# Patient Record
Sex: Female | Born: 1975 | Race: Black or African American | Hispanic: No | Marital: Single | Smoking: Never smoker
Health system: Southern US, Community
[De-identification: ages and names within clinical notes are randomized; demographics above are authoritative.]

## PROBLEM LIST (undated history)

## (undated) ENCOUNTER — Inpatient Hospital Stay (HOSPITAL_COMMUNITY): Payer: Self-pay

## (undated) DIAGNOSIS — M539 Dorsopathy, unspecified: Secondary | ICD-10-CM

## (undated) DIAGNOSIS — F32A Depression, unspecified: Secondary | ICD-10-CM

## (undated) DIAGNOSIS — T7840XA Allergy, unspecified, initial encounter: Secondary | ICD-10-CM

## (undated) DIAGNOSIS — IMO0002 Reserved for concepts with insufficient information to code with codable children: Secondary | ICD-10-CM

## (undated) DIAGNOSIS — R202 Paresthesia of skin: Secondary | ICD-10-CM

## (undated) DIAGNOSIS — G9050 Complex regional pain syndrome I, unspecified: Secondary | ICD-10-CM

## (undated) DIAGNOSIS — R55 Syncope and collapse: Secondary | ICD-10-CM

## (undated) DIAGNOSIS — F329 Major depressive disorder, single episode, unspecified: Secondary | ICD-10-CM

## (undated) DIAGNOSIS — M797 Fibromyalgia: Secondary | ICD-10-CM

## (undated) DIAGNOSIS — M1990 Unspecified osteoarthritis, unspecified site: Secondary | ICD-10-CM

## (undated) HISTORY — DX: Depression, unspecified: F32.A

## (undated) HISTORY — DX: Complex regional pain syndrome I, unspecified: G90.50

## (undated) HISTORY — DX: Paresthesia of skin: R20.2

## (undated) HISTORY — DX: Major depressive disorder, single episode, unspecified: F32.9

## (undated) HISTORY — DX: Syncope and collapse: R55

## (undated) HISTORY — DX: Fibromyalgia: M79.7

## (undated) HISTORY — DX: Allergy, unspecified, initial encounter: T78.40XA

---

## 2014-01-03 DIAGNOSIS — R21 Rash and other nonspecific skin eruption: Secondary | ICD-10-CM | POA: Diagnosis not present

## 2014-01-06 DIAGNOSIS — H1045 Other chronic allergic conjunctivitis: Secondary | ICD-10-CM | POA: Diagnosis not present

## 2014-01-06 DIAGNOSIS — L500 Allergic urticaria: Secondary | ICD-10-CM | POA: Diagnosis not present

## 2014-01-06 DIAGNOSIS — L272 Dermatitis due to ingested food: Secondary | ICD-10-CM | POA: Diagnosis not present

## 2014-02-03 DIAGNOSIS — Z0000 Encounter for general adult medical examination without abnormal findings: Secondary | ICD-10-CM | POA: Diagnosis not present

## 2014-02-03 DIAGNOSIS — G4700 Insomnia, unspecified: Secondary | ICD-10-CM | POA: Diagnosis not present

## 2014-02-03 DIAGNOSIS — M25569 Pain in unspecified knee: Secondary | ICD-10-CM | POA: Diagnosis not present

## 2014-02-03 DIAGNOSIS — M545 Low back pain, unspecified: Secondary | ICD-10-CM | POA: Diagnosis not present

## 2014-02-15 DIAGNOSIS — M25569 Pain in unspecified knee: Secondary | ICD-10-CM | POA: Diagnosis not present

## 2014-02-19 DIAGNOSIS — R209 Unspecified disturbances of skin sensation: Secondary | ICD-10-CM | POA: Diagnosis not present

## 2014-02-19 DIAGNOSIS — Z30012 Encounter for prescription of emergency contraception: Secondary | ICD-10-CM | POA: Diagnosis not present

## 2014-02-19 DIAGNOSIS — M79609 Pain in unspecified limb: Secondary | ICD-10-CM | POA: Diagnosis not present

## 2014-02-24 DIAGNOSIS — H526 Other disorders of refraction: Secondary | ICD-10-CM | POA: Diagnosis not present

## 2014-03-01 DIAGNOSIS — M79609 Pain in unspecified limb: Secondary | ICD-10-CM | POA: Diagnosis not present

## 2014-03-09 DIAGNOSIS — IMO0001 Reserved for inherently not codable concepts without codable children: Secondary | ICD-10-CM | POA: Diagnosis not present

## 2014-03-09 DIAGNOSIS — G90529 Complex regional pain syndrome I of unspecified lower limb: Secondary | ICD-10-CM | POA: Diagnosis not present

## 2014-03-09 DIAGNOSIS — Z79899 Other long term (current) drug therapy: Secondary | ICD-10-CM | POA: Diagnosis not present

## 2014-03-09 DIAGNOSIS — M533 Sacrococcygeal disorders, not elsewhere classified: Secondary | ICD-10-CM | POA: Diagnosis not present

## 2014-03-09 DIAGNOSIS — M79609 Pain in unspecified limb: Secondary | ICD-10-CM | POA: Diagnosis not present

## 2014-03-24 DIAGNOSIS — R5381 Other malaise: Secondary | ICD-10-CM | POA: Diagnosis not present

## 2014-03-24 DIAGNOSIS — Z113 Encounter for screening for infections with a predominantly sexual mode of transmission: Secondary | ICD-10-CM | POA: Diagnosis not present

## 2014-03-24 DIAGNOSIS — G90529 Complex regional pain syndrome I of unspecified lower limb: Secondary | ICD-10-CM | POA: Diagnosis not present

## 2014-03-24 DIAGNOSIS — G90519 Complex regional pain syndrome I of unspecified upper limb: Secondary | ICD-10-CM | POA: Diagnosis not present

## 2014-03-24 DIAGNOSIS — Z136 Encounter for screening for cardiovascular disorders: Secondary | ICD-10-CM | POA: Diagnosis not present

## 2014-03-24 DIAGNOSIS — R209 Unspecified disturbances of skin sensation: Secondary | ICD-10-CM | POA: Diagnosis not present

## 2014-03-24 DIAGNOSIS — R5383 Other fatigue: Secondary | ICD-10-CM | POA: Diagnosis not present

## 2014-03-24 DIAGNOSIS — M2550 Pain in unspecified joint: Secondary | ICD-10-CM | POA: Diagnosis not present

## 2014-03-24 DIAGNOSIS — Z13220 Encounter for screening for lipoid disorders: Secondary | ICD-10-CM | POA: Diagnosis not present

## 2014-04-01 DIAGNOSIS — F4542 Pain disorder with related psychological factors: Secondary | ICD-10-CM | POA: Diagnosis not present

## 2014-04-04 DIAGNOSIS — IMO0001 Reserved for inherently not codable concepts without codable children: Secondary | ICD-10-CM | POA: Diagnosis not present

## 2014-04-04 DIAGNOSIS — M25569 Pain in unspecified knee: Secondary | ICD-10-CM | POA: Diagnosis not present

## 2014-04-15 DIAGNOSIS — G894 Chronic pain syndrome: Secondary | ICD-10-CM | POA: Diagnosis not present

## 2014-04-21 DIAGNOSIS — G90529 Complex regional pain syndrome I of unspecified lower limb: Secondary | ICD-10-CM | POA: Diagnosis not present

## 2014-04-21 DIAGNOSIS — M79609 Pain in unspecified limb: Secondary | ICD-10-CM | POA: Diagnosis not present

## 2014-04-21 DIAGNOSIS — Z79899 Other long term (current) drug therapy: Secondary | ICD-10-CM | POA: Diagnosis not present

## 2014-04-21 DIAGNOSIS — M533 Sacrococcygeal disorders, not elsewhere classified: Secondary | ICD-10-CM | POA: Diagnosis not present

## 2014-04-29 DIAGNOSIS — IMO0001 Reserved for inherently not codable concepts without codable children: Secondary | ICD-10-CM | POA: Diagnosis not present

## 2014-04-29 DIAGNOSIS — T7840XA Allergy, unspecified, initial encounter: Secondary | ICD-10-CM | POA: Diagnosis not present

## 2014-05-05 DIAGNOSIS — F4542 Pain disorder with related psychological factors: Secondary | ICD-10-CM | POA: Diagnosis not present

## 2014-05-11 DIAGNOSIS — M25562 Pain in left knee: Secondary | ICD-10-CM | POA: Diagnosis not present

## 2014-05-11 DIAGNOSIS — M25561 Pain in right knee: Secondary | ICD-10-CM | POA: Diagnosis not present

## 2014-05-11 DIAGNOSIS — M797 Fibromyalgia: Secondary | ICD-10-CM | POA: Diagnosis not present

## 2014-05-11 DIAGNOSIS — M940 Chondrocostal junction syndrome [Tietze]: Secondary | ICD-10-CM | POA: Diagnosis not present

## 2014-08-08 DIAGNOSIS — R1084 Generalized abdominal pain: Secondary | ICD-10-CM | POA: Diagnosis not present

## 2015-05-18 ENCOUNTER — Encounter (HOSPITAL_COMMUNITY): Payer: Self-pay | Admitting: *Deleted

## 2015-05-18 ENCOUNTER — Emergency Department (INDEPENDENT_AMBULATORY_CARE_PROVIDER_SITE_OTHER)
Admission: EM | Admit: 2015-05-18 | Discharge: 2015-05-18 | Disposition: A | Discharge status: Home / Self Care | Payer: Medicare (Managed Care) | Source: Home / Self Care | Attending: Emergency Medicine | Admitting: Emergency Medicine

## 2015-05-18 DIAGNOSIS — G8929 Other chronic pain: Secondary | ICD-10-CM | POA: Diagnosis not present

## 2015-05-18 DIAGNOSIS — R6889 Other general symptoms and signs: Secondary | ICD-10-CM

## 2015-05-18 DIAGNOSIS — K0510 Chronic gingivitis, plaque induced: Secondary | ICD-10-CM

## 2015-05-18 DIAGNOSIS — M545 Low back pain: Secondary | ICD-10-CM | POA: Diagnosis not present

## 2015-05-18 HISTORY — DX: Dorsopathy, unspecified: M53.9

## 2015-05-18 HISTORY — DX: Unspecified osteoarthritis, unspecified site: M19.90

## 2015-05-18 MED ORDER — DICLOFENAC POTASSIUM 50 MG PO TABS: 50.0000 mg | ORAL_TABLET | Freq: Three times a day (TID) | ORAL | Status: DC

## 2015-05-18 MED ORDER — TIZANIDINE HCL 4 MG PO TABS: 4.0000 mg | ORAL_TABLET | Freq: Two times a day (BID) | ORAL | Status: DC

## 2015-05-18 MED ORDER — PENICILLIN V POTASSIUM 250 MG PO TABS: ORAL_TABLET | ORAL | Status: DC
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
## 2015-05-18 NOTE — Discharge Instructions (Signed)
Chronic Back Pain  When back pain lasts longer than 3 months, it is called chronic back pain.People with chronic back pain often go through certain periods that are more intense (flare-ups).  CAUSES Chronic back pain can be caused by wear and tear (degeneration) on different structures in your back. These structures include:  The bones of your spine (vertebrae) and the joints surrounding your spinal cord and nerve roots (facets).  The strong, fibrous tissues that connect your vertebrae (ligaments). Degeneration of these structures may result in pressure on your nerves. This can lead to constant pain. HOME CARE INSTRUCTIONS  Avoid bending, heavy lifting, prolonged sitting, and activities which make the problem worse.  Take brief periods of rest throughout the day to reduce your pain. Lying down or standing usually is better than sitting while you are resting.  Take over-the-counter or prescription medicines only as directed by your caregiver. SEEK IMMEDIATE MEDICAL CARE IF:   You have weakness or numbness in one of your legs or feet.  You have trouble controlling your bladder or bowels.  You have nausea, vomiting, abdominal pain, shortness of breath, or fainting.   This information is not intended to replace advice given to you by your health care provider. Make sure you discuss any questions you have with your health care provider.   Document Released: 08/29/2004 Document Revised: 10/14/2011 Document Reviewed: 01/09/2015 Elsevier Interactive Patient Education 2016 [place] and Dentist Visits Dental care supports good overall health. Regular dental visits can also help you avoid dental pain, bleeding, infection, and other more serious health problems in the future. It is important to keep the mouth healthy because diseases in the teeth, gums, and other oral tissues can spread to other areas of the body. Some problems, such as diabetes, heart disease, and pre-term  labor have been associated with poor oral health.  See your dentist every 6 months. If you experience emergency problems such as a toothache or broken tooth, go to the dentist right away. If you see your dentist regularly, you may catch problems early. It is easier to be treated for problems in the early stages.  WHAT TO EXPECT AT A DENTIST VISIT  Your dentist will look for many common oral health problems and recommend proper treatment. At your regular dental visit, you can expect:  Gentle cleaning of the teeth and gums. This includes scraping and polishing. This helps to remove the sticky substance around the teeth and gums (plaque). Plaque forms in the mouth shortly after eating. Over time, plaque hardens on the teeth as tartar. If tartar is not removed regularly, it can cause problems. Cleaning also helps remove stains.  Periodic X-rays. These pictures of the teeth and supporting bone will help your dentist assess the health of your teeth.  Periodic fluoride treatments. Fluoride is a natural mineral shown to help strengthen teeth. Fluoride treatmentinvolves applying a fluoride gel or varnish to the teeth. It is most commonly done in children.  Examination of the mouth, tongue, jaws, teeth, and gums to look for any oral health problems, such as:  Cavities (dental caries). This is decay on the tooth caused by plaque, sugar, and acid in the mouth. It is best to catch a cavity when it is small.  Inflammation of the gums caused by plaque buildup (gingivitis).  Problems with the mouth or malformed or misaligned teeth.  Oral cancer or other diseases of the soft tissues or jaws. KEEP YOUR TEETH AND GUMS HEALTHY For healthy teeth  and gums, follow these general guidelines as well as your dentist's specific advice:  Have your teeth professionally cleaned at the dentist every 6 months.  Brush twice daily with a fluoride toothpaste.  Floss your teeth daily.  Ask your dentist if you need  fluoride supplements, treatments, or fluoride toothpaste.  Eat a healthy diet. Reduce foods and drinks with added sugar.  Avoid smoking. TREATMENT FOR ORAL HEALTH PROBLEMS If you have oral health problems, treatment varies depending on the conditions present in your teeth and gums.  Your caregiver will most likely recommend good oral hygiene at each visit.  For cavities, gingivitis, or other oral health disease, your caregiver will perform a procedure to treat the problem. This is typically done at a separate appointment. Sometimes your caregiver will refer you to another dental specialist for specific tooth problems or for surgery. SEEK IMMEDIATE DENTAL CARE IF:  You have pain, bleeding, or soreness in the gum, tooth, jaw, or mouth area.  A permanent tooth becomes loose or separated from the gum socket.  You experience a blow or injury to the mouth or jaw area.   This information is not intended to replace advice given to you by your health care provider. Make sure you discuss any questions you have with your health care provider.   Document Released: 04/03/2011 Document Revised: 10/14/2011 Document Reviewed: 04/03/2011 Elsevier Interactive Patient Education 2016 [employer]  Gingivitis Gingivitis is a form of gum (periodontal) disease that causes redness, soreness, and swelling (inflammation) of your gums. CAUSES The most common cause of gingivitis is poor oral hygiene. A sticky substance made of bacteria, mucus, and food particles (plaque), is deposited on the exposed part of teeth. As plaque builds up, it reacts with the saliva in your mouth to form something called  tartar. Tartar is a hard deposit that becomes trapped around the base of the tooth. Plaque and tartar irritate the gums, leading to the formation of gingivitis. Other factors that increase your risk for gingivitis include:   Tobacco use.  Diabetes.  Older age.  Certain medications.  Certain viral or fungal  infections.  Dry mouth.  Hormonal changes such as during pregnancy.  Poor nutrition.  Substance abuse.  Poor fitting dental restorations or appliances. SYMPTOMS You may notice inflammation of the soft tissue (gingiva) around the teeth. When these tissues become inflamed, they bleed easily, especially during flossing or brushing. The gums may also be:   Tender to the touch.  Bright red, purple red, or have a shiny appearance.  Swollen.  Wearing away from the teeth (receding), which exposes more of the tooth. Bad breath is often present. Continued infection around teeth can eventually cause cavities and loosen teeth. This may lead to eventual tooth loss. DIAGNOSIS A medical and dental history will be taken. Your mouth, teeth, and gums will be examined. Your dentist will look for soft, swollen purple-red, irritated gums. There may be deposits of plaque and tartar at the base of the teeth. Your gums will be looked at for the degree of redness, puffiness, and bleeding tendencies. Your dentist will see if any of the teeth are loose. X-rays may be taken to see if the inflammation has spread to the supporting structures of the teeth. TREATMENT The goal is to reduce and reverse the inflammation. Proper treatment can usually reverse the symptoms of gingivitis and prevent further progression of the disease. Have your teeth cleaned. During the cleaning, all plaque and tartar will be removed. Instruction for proper home  care will be given. You will need regular professional cleanings and check-ups in the future. HOME CARE INSTRUCTIONS  Brush your teeth twice a day and floss at least once per day. When flossing, it is best to floss first then brush.  Limit sugar between meals and maintain a well-balanced diet.  Even the best dental hygiene will not prevent plaque from developing. It is necessary for you to see your dentist on a regular basis for cleaning and regular checkups.  Your dentist can  recommend proper oral hygiene and mouth care and suggest special toothpastes or mouth rinses.  Stop smoking. SEEK DENTAL OR MEDICAL CARE IF:  You have painful, reddened tissue around your teeth, or you have puffy swollen gums.  You have difficulty chewing.  You notice any loose or infected teeth.  You have swollen glands.  Your gums bleed easily when you brush your teeth or are very tender to the touch.   This information is not intended to replace advice given to you by your health care provider. Make sure you discuss any questions you have with your health care provider.   Document Released: 01/15/2001 Document Revised: 10/14/2011 Document Reviewed: 03/06/2015 Elsevier Interactive Patient Education 2016 [employer]  Musculoskeletal Pain Musculoskeletal pain is muscle and boney aches and pains. These pains can occur in any part of the body. Your caregiver may treat you without knowing the cause of the pain. They may treat you if blood or urine tests, X-rays, and other tests were normal.  CAUSES There is often not a definite cause or reason for these pains. These pains may be caused by a type of germ (virus). The discomfort may also come from overuse. Overuse includes working out too hard when your body is not fit. Boney aches also come from weather changes. Bone is sensitive to atmospheric pressure changes. HOME CARE INSTRUCTIONS   Ask when your test results will be ready. Make sure you get your test results.  Only take over-the-counter or prescription medicines for pain, discomfort, or fever as directed by your caregiver. If you were given medications for your condition, do not drive, operate machinery or power tools, or sign legal documents for 24 hours. Do not drink alcohol. Do not take sleeping pills or other medications that may interfere with treatment.  Continue all activities unless the activities cause more pain. When the pain lessens, slowly resume normal activities.  Gradually increase the intensity and duration of the activities or exercise.  During periods of severe pain, bed rest may be helpful. Lay or sit in any position that is comfortable.  Putting ice on the injured area.  Put ice in a bag.  Place a towel between your skin and the bag.  Leave the ice on for 15 to 20 minutes, 3 to 4 times a day.  Follow up with your caregiver for continued problems and no reason can be found for the pain. If the pain becomes worse or does not go away, it may be necessary to repeat tests or do additional testing. Your caregiver may need to look further for a possible cause. SEEK IMMEDIATE MEDICAL CARE IF:  You have pain that is getting worse and is not relieved by medications.  You develop chest pain that is associated with shortness or breath, sweating, feeling sick to your stomach (nauseous), or throw up (vomit).  Your pain becomes localized to the abdomen.  You develop any new symptoms that seem different or that concern you. MAKE SURE YOU:  Understand these instructions.  Will watch your condition.  Will get help right away if you are not doing well or get worse.   This information is not intended to replace advice given to you by your health care provider. Make sure you discuss any questions you have with your health care provider.   Document Released: 07/22/2005 Document Revised: 10/14/2011 Document Reviewed: 03/26/2013 Elsevier Interactive Patient Education [employer].

## 2015-06-15 ENCOUNTER — Ambulatory Visit (INDEPENDENT_AMBULATORY_CARE_PROVIDER_SITE_OTHER): Payer: Medicare (Managed Care)

## 2015-06-15 ENCOUNTER — Ambulatory Visit (INDEPENDENT_AMBULATORY_CARE_PROVIDER_SITE_OTHER): Payer: Medicare (Managed Care) | Admitting: Family Medicine

## 2015-06-15 VITALS — BP 100/70 | HR 81 | Temp 98.7°F | Resp 18 | Ht 60.0 in | Wt 164.4 lb

## 2015-06-15 DIAGNOSIS — D649 Anemia, unspecified: Secondary | ICD-10-CM

## 2015-06-15 DIAGNOSIS — H538 Other visual disturbances: Secondary | ICD-10-CM

## 2015-06-15 DIAGNOSIS — M25561 Pain in right knee: Secondary | ICD-10-CM | POA: Diagnosis not present

## 2015-06-15 DIAGNOSIS — R42 Dizziness and giddiness: Secondary | ICD-10-CM | POA: Diagnosis not present

## 2015-06-15 DIAGNOSIS — R202 Paresthesia of skin: Secondary | ICD-10-CM

## 2015-06-15 DIAGNOSIS — R55 Syncope and collapse: Secondary | ICD-10-CM

## 2015-06-15 DIAGNOSIS — G8929 Other chronic pain: Secondary | ICD-10-CM | POA: Diagnosis not present

## 2015-06-15 DIAGNOSIS — M545 Low back pain: Secondary | ICD-10-CM | POA: Diagnosis not present

## 2015-06-15 LAB — POCT CBC
Granulocyte percent: 64.1 %G (ref 37–80)
HCT, POC: 32.2 % — AB (ref 37.7–47.9)
Hemoglobin: 10 g/dL — AB (ref 12.2–16.2)
Lymph, poc: 2.1 (ref 0.6–3.4)
MCH, POC: 22.6 pg — AB (ref 27–31.2)
MCHC: 31.1 g/dL — AB (ref 31.8–35.4)
MCV: 72.7 fL — AB (ref 80–97)
MID (cbc): 0.4 (ref 0–0.9)
MPV: 8.5 fL (ref 0–99.8)
POC Granulocyte: 4.4 (ref 2–6.9)
POC LYMPH PERCENT: 30.6 %L (ref 10–50)
POC MID %: 5.3 %M (ref 0–12)
Platelet Count, POC: 259 10*3/uL (ref 142–424)
RBC: 4.44 M/uL (ref 4.04–5.48)
RDW, POC: 18.4 %
WBC: 6.8 10*3/uL (ref 4.6–10.2)

## 2015-06-15 LAB — BASIC METABOLIC PANEL
BUN: 7 mg/dL (ref 7–25)
CO2: 25 mmol/L (ref 20–31)
Calcium: 9.2 mg/dL (ref 8.6–10.2)
Chloride: 103 mmol/L (ref 98–110)
Creat: 0.62 mg/dL (ref 0.50–1.10)
Glucose, Bld: 88 mg/dL (ref 65–99)
Potassium: 4.3 mmol/L (ref 3.5–5.3)
Sodium: 137 mmol/L (ref 135–146)

## 2015-06-15 LAB — TSH: TSH: 1.523 u[IU]/mL (ref 0.350–4.500)

## 2015-06-15 LAB — GLUCOSE, POCT (MANUAL RESULT ENTRY): POC Glucose: 88 mg/dl (ref 70–99)

## 2015-06-15 MED ORDER — FERROUS SULFATE 325 (65 FE) MG PO TABS: 325.0000 mg | ORAL_TABLET | Freq: Every day | ORAL | Status: DC

## 2015-06-15 MED ORDER — LIDOCAINE 4 % EX CREA: 1.0000 "application " | TOPICAL_CREAM | Freq: Four times a day (QID) | CUTANEOUS | Status: DC | PRN

## 2015-06-15 MED ORDER — DICLOFENAC POTASSIUM 50 MG PO TABS: 50.0000 mg | ORAL_TABLET | Freq: Three times a day (TID) | ORAL | Status: DC

## 2015-06-15 NOTE — Progress Notes (Addendum)
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 fast heartbeat.  You have pain with breathing.  You have repeated fainting or seizure-like jerking during an episode.  You faint when sitting or lying down.  You have confusion.  You have trouble walking.  You have severe weakness.  You have vision problems. If you fainted, call your local emergency services (911 in [place] Do not drive yourself to the hospital.    This information is not intended to replace advice given to you by your health care provider. Make sure you discuss any questions you have with your health care provider.   Document Released: 07/22/2005 Document Revised: 12/06/2014 Document Reviewed: 09/20/2011 Elsevier Interactive Patient Education [employer].      By signing my name below, I, Zola Button, attest that this documentation has been prepared under the direction and  in the presence of Merri Ray, MD.  Electronically Signed: Zola Button, Medical Scribe. 06/15/2015. 4:12 PM.

## 2015-06-15 NOTE — Patient Instructions (Signed)
Your hemoglobin was low in the office, indicating some anemia. I will check some iron tests, but start iron supplement once per day. This was sent to your pharmacy. Recheck within the next 1 week to recheck this level as well as discuss your other symptoms today and follow-up plan.  For your back pain, I will refer you to pain management as well as orthopedics for further evaluation. Okay to use the lidocaine topically, but would also recommend taking Tylenol over the anti-inflammatory that was prescribed last month that I reprinted for you today.  I will also refer you to neurology to discuss the dizziness and recent syncopal or passing out episode. Make sure you're drinking plenty fluids throughout the day, and would be very careful taking a medicine such as the tizanidine as it can make you more sedated. Would also recommend other sedating medicines for right now.  If any further episodes of feeling like you will pass out, or worsening symptoms, call 911 or go to the emergency room.  Return to the clinic or go to the nearest emergency room if any of your symptoms worsen or new symptoms occur.  Back Pain, Adult Back pain is very common in adults.The cause of back pain is rarely dangerous and the pain often gets better over time.The cause of your back pain may not be known. Some common causes of back pain include:  Strain of the muscles or ligaments supporting the spine.  Wear and tear (degeneration) of the spinal disks.  Arthritis.  Direct injury to the back. For many people, back pain may return. Since back pain is rarely dangerous, most people can learn to manage this condition on their own. HOME CARE INSTRUCTIONS Watch your back pain for any changes. The following actions may help to lessen any discomfort you are feeling:  Remain active. It is stressful on your back to sit or stand in one place for long periods of time. Do not sit, drive, or stand in one place for more than 30 minutes  at a time. Take short walks on even surfaces as soon as you are able.Try to increase the length of time you walk each day.  Exercise regularly as directed by your health care provider. Exercise helps your back heal faster. It also helps avoid future injury by keeping your muscles strong and flexible.  Do not stay in bed.Resting more than 1-2 days can delay your recovery.  Pay attention to your body when you bend and lift. The most comfortable positions are those that put less stress on your recovering back. Always use proper lifting techniques, including:  Bending your knees.  Keeping the load close to your body.  Avoiding twisting.  Find a comfortable position to sleep. Use a firm mattress and lie on your side with your knees slightly bent. If you lie on your back, put a pillow under your knees.  Avoid feeling anxious or stressed.Stress increases muscle tension and can worsen back pain.It is important to recognize when you are anxious or stressed and learn ways to manage it, such as with exercise.  Take medicines only as directed by your health care provider. Over-the-counter medicines to reduce pain and inflammation are often the most helpful.Your health care provider may prescribe muscle relaxant drugs.These medicines help dull your pain so you can more quickly return to your normal activities and healthy exercise.  Apply ice to the injured area:  Put ice in a plastic bag.  Place a towel between your skin and the  bag.  Leave the ice on for 20 minutes, 2-3 times a day for the first 2-3 days. After that, ice and heat may be alternated to reduce pain and spasms.  Maintain a healthy weight. Excess weight puts extra stress on your back and makes it difficult to maintain good posture. SEEK MEDICAL CARE IF:  You have pain that is not relieved with rest or medicine.  You have increasing pain going down into the legs or buttocks.  You have pain that does not improve in one  week.  You have night pain.  You lose weight.  You have a fever or chills. SEEK IMMEDIATE MEDICAL CARE IF:   You develop new bowel or bladder control problems.  You have unusual weakness or numbness in your arms or legs.  You develop nausea or vomiting.  You develop abdominal pain.  You feel faint.   This information is not intended to replace advice given to you by your health care provider. Make sure you discuss any questions you have with your health care provider.   Document Released: 07/22/2005 Document Revised: 08/12/2014 Document Reviewed: 11/23/2013 Elsevier Interactive Patient Education 2016 [employer].  Anemia, Nonspecific Anemia is a condition in which the concentration of red blood cells or hemoglobin in the blood is below normal. Hemoglobin is a substance in red blood cells that carries oxygen to the tissues of the body. Anemia results in not enough oxygen reaching these tissues.  CAUSES  Common causes of anemia include:   Excessive bleeding. Bleeding may be internal or external. This includes excessive bleeding from periods (in women) or from the intestine.   Poor nutrition.   Chronic kidney, thyroid, and liver disease.  Bone marrow disorders that decrease red blood cell production.  Cancer and treatments for cancer.  HIV, AIDS, and their treatments.  Spleen problems that increase red blood cell destruction.  Blood disorders.  Excess destruction of red blood cells due to infection, medicines, and autoimmune disorders. SIGNS AND SYMPTOMS   Minor weakness.   Dizziness.   Headache.  Palpitations.   Shortness of breath, especially with exercise.   Paleness.  Cold sensitivity.  Indigestion.  Nausea.  Difficulty sleeping.  Difficulty concentrating. Symptoms may occur suddenly or they may develop slowly.  DIAGNOSIS  Additional blood tests are often needed. These help your health care provider determine the best treatment. Your  health care provider will check your stool for blood and look for other causes of blood loss.  TREATMENT  Treatment varies depending on the cause of the anemia. Treatment can include:   Supplements of iron, vitamin 123456, or folic acid.   Hormone medicines.   A blood transfusion. This may be needed if blood loss is severe.   Hospitalization. This may be needed if there is significant continual blood loss.   Dietary changes.  Spleen removal. HOME CARE INSTRUCTIONS Keep all follow-up appointments. It often takes many weeks to correct anemia, and having your health care provider check on your condition and your response to treatment is very important. SEEK IMMEDIATE MEDICAL CARE IF:   You develop extreme weakness, shortness of breath, or chest pain.   You become dizzy or have trouble concentrating.  You develop heavy vaginal bleeding.   You develop a rash.     You have bloody or black, tarry stools.   You faint.   You vomit up blood.   You vomit repeatedly.   You have abdominal pain.  You have a fever or persistent symptoms for  more than 2-3 days.   You have a fever and your symptoms suddenly get worse.   You are dehydrated.  MAKE SURE YOU:  Understand these instructions.  Will watch your condition.  Will get help right away if you are not doing well or get worse.   This information is not intended to replace advice given to you by your health care provider. Make sure you discuss any questions you have with your health care provider.   Document Released: 08/29/2004 Document Revised: 03/24/2013 Document Reviewed: 01/15/2013 Elsevier Interactive Patient Education 2016 [employer]  Dizziness Dizziness is a common problem. It is a feeling of unsteadiness or light-headedness. You may feel like you are about to faint. Dizziness can lead to injury if you stumble or fall. Anyone can become dizzy, but dizziness is more common in older adults. This  condition can be caused by a number of things, including medicines, dehydration, or illness. HOME CARE INSTRUCTIONS Taking these steps may help with your condition: Eating and Drinking  Drink enough fluid to keep your urine clear or pale yellow. This helps to keep you from becoming dehydrated. Try to drink more clear fluids, such as water.  Do not drink alcohol.  Limit your caffeine intake if directed by your health care provider.  Limit your salt intake if directed by your health care provider. Activity  Avoid making quick movements.  Rise slowly from chairs and steady yourself until you feel okay.  In the morning, first sit up on the side of the bed. When you feel okay, stand slowly while you hold onto something until you know that your balance is fine.  Move your legs often if you need to stand in one place for a long time. Tighten and relax your muscles in your legs while you are standing.  Do not drive or operate heavy machinery if you feel dizzy.  Avoid bending down if you feel dizzy. Place items in your home so that they are easy for you to reach without leaning over. Lifestyle  Do not use any tobacco products, including cigarettes, chewing tobacco, or electronic cigarettes. If you need help quitting, ask your health care provider.  Try to reduce your stress level, such as with yoga or meditation. Talk with your health care provider if you need help. General Instructions  Watch your dizziness for any changes.  Take medicines only as directed by your health care provider. Talk with your health care provider if you think that your dizziness is caused by a medicine that you are taking.  Tell a friend or a family member that you are feeling dizzy. If he or she notices any changes in your behavior, have this person call your health care provider.  Keep all follow-up visits as directed by your health care provider. This is important. SEEK MEDICAL CARE IF:  Your dizziness  does not go away.  Your dizziness or light-headedness gets worse.  You feel nauseous.  You have reduced hearing.  You have new symptoms.  You are unsteady on your feet or you feel like the room is spinning. SEEK IMMEDIATE MEDICAL CARE IF:  You vomit or have diarrhea and are unable to eat or drink anything.  You have problems talking, walking, swallowing, or using your arms, hands, or legs.  You feel generally weak.  You are not thinking clearly or you have trouble forming sentences. It may take a friend or family member to notice this.  You have chest pain, abdominal pain, shortness  of breath, or sweating.  Your vision changes.  You notice any bleeding.  You have a headache.  You have neck pain or a stiff neck.  You have a fever.   This information is not intended to replace advice given to you by your health care provider. Make sure you discuss any questions you have with your health care provider.   Document Released: 01/15/2001 Document Revised: 12/06/2014 Document Reviewed: 07/18/2014 Elsevier Interactive Patient Education 2016 [employer].   Syncope Syncope is a medical term for fainting or passing out. This means you lose consciousness and drop to the ground. People are generally unconscious for less than 5 minutes. You may have some muscle twitches for up to 15 seconds before waking up and returning to normal. Syncope occurs more often in older adults, but it can happen to anyone. While most causes of syncope are not dangerous, syncope can be a sign of a serious medical problem. It is important to seek medical care.  CAUSES  Syncope is caused by a sudden drop in blood flow to the brain. The specific cause is often not determined. Factors that can bring on syncope include:  Taking medicines that lower blood pressure.  Sudden changes in posture, such as standing up quickly.  Taking more medicine than prescribed.  Standing in one place for too long.  Seizure  disorders.  Dehydration and excessive exposure to heat.  Low blood sugar (hypoglycemia).  Straining to have a bowel movement.  Heart disease, irregular heartbeat, or other circulatory problems.  Fear, emotional distress, seeing blood, or severe pain. SYMPTOMS  Right before fainting, you may:  Feel dizzy or light-headed.  Feel nauseous.  See all white or all black in your field of vision.  Have cold, clammy skin. DIAGNOSIS  Your health care provider will ask about your symptoms, perform a physical exam, and perform an electrocardiogram (ECG) to record the electrical activity of your heart. Your health care provider may also perform other heart or blood tests to determine the cause of your syncope which may include:  Transthoracic echocardiogram (TTE). During echocardiography, sound waves are used to evaluate how blood flows through your heart.  Transesophageal echocardiogram (TEE).  Cardiac monitoring. This allows your health care provider to monitor your heart rate and rhythm in real time.  Holter monitor. This is a portable device that records your heartbeat and can help diagnose heart arrhythmias. It allows your health care provider to track your heart activity for several days, if needed.  Stress tests by exercise or by giving medicine that makes the heart beat faster. TREATMENT  In most cases, no treatment is needed. Depending on the cause of your syncope, your health care provider may recommend changing or stopping some of your medicines. HOME CARE INSTRUCTIONS  Have someone stay with you until you feel stable.  Do not drive, use machinery, or play sports until your health care provider says it is okay.  Keep all follow-up appointments as directed by your health care provider.  Lie down right away if you start feeling like you might faint. Breathe deeply and steadily. Wait until all the symptoms have passed.  Drink enough fluids to keep your urine clear or pale  yellow.  If you are taking blood pressure or heart medicine, get up slowly and take several minutes to sit and then stand. This can reduce dizziness. SEEK IMMEDIATE MEDICAL CARE IF:   You have a severe headache.  You have unusual pain in the chest, abdomen, or  back.  You are bleeding from your mouth or rectum, or you have black or tarry stool.  You have an irregular or very fast heartbeat.  You have pain with breathing.  You have repeated fainting or seizure-like jerking during an episode.  You faint when sitting or lying down.  You have confusion.  You have trouble walking.  You have severe weakness.  You have vision problems. If you fainted, call your local emergency services (911 in [place] Do not drive yourself to the hospital.    This information is not intended to replace advice given to you by your health care provider. Make sure you discuss any questions you have with your health care provider.   Document Released: 07/22/2005 Document Revised: 12/06/2014 Document Reviewed: 09/20/2011 Elsevier Interactive Patient Education [employer].

## 2015-06-16 LAB — IBC PANEL
%SAT: 7 % — ABNORMAL LOW (ref 11–50)
TIBC: 442 ug/dL (ref 250–450)
UIBC: 410 ug/dL — ABNORMAL HIGH (ref 125–400)

## 2015-06-16 LAB — IRON: IRON: 32 ug/dL — AB (ref 40–190)

## 2015-06-20 ENCOUNTER — Ambulatory Visit: Payer: Medicare (Managed Care) | Admitting: Neurology

## 2015-06-21 ENCOUNTER — Telehealth: Payer: Self-pay | Admitting: Family Medicine

## 2015-06-21 ENCOUNTER — Other Ambulatory Visit: Payer: Self-pay | Admitting: Physician Assistant

## 2015-06-21 DIAGNOSIS — D509 Iron deficiency anemia, unspecified: Secondary | ICD-10-CM

## 2015-06-21 MED ORDER — FERROUS SULFATE 300 (60 FE) MG/5ML PO SYRP
ORAL_SOLUTION | ORAL | Status: DC
Start: 2015-06-21 — End: 2017-02-28

## 2015-06-21 NOTE — Telephone Encounter (Signed)
Patient would like to know if she can be prescribed a higher dosage in her iron pill. She is currently at 325mg  however she states that her insurance will not cover that dosage amount. Can she go higher?  [phone]

## 2015-06-21 NOTE — Telephone Encounter (Signed)
I have sent an alternative to the pharmacy in the form of a liquid.  Hopefully this will be paid for.  Advise that she does not take more than what is prescribed per dose please.

## 2015-06-22 NOTE — Telephone Encounter (Signed)
Pt.notified

## 2015-07-13 ENCOUNTER — Encounter: Payer: Self-pay | Admitting: Neurology

## 2015-07-13 ENCOUNTER — Ambulatory Visit (INDEPENDENT_AMBULATORY_CARE_PROVIDER_SITE_OTHER): Payer: Medicare (Managed Care) | Admitting: Neurology

## 2015-07-13 ENCOUNTER — Encounter (INDEPENDENT_AMBULATORY_CARE_PROVIDER_SITE_OTHER): Payer: Self-pay

## 2015-07-13 VITALS — BP 112/76 | HR 87 | Ht 60.0 in | Wt 163.5 lb

## 2015-07-13 DIAGNOSIS — R202 Paresthesia of skin: Secondary | ICD-10-CM

## 2015-07-13 DIAGNOSIS — R55 Syncope and collapse: Secondary | ICD-10-CM | POA: Diagnosis not present

## 2015-07-13 HISTORY — DX: Syncope and collapse: R55

## 2015-07-13 HISTORY — DX: Paresthesia of skin: R20.2
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
## 2015-07-13 NOTE — Patient Instructions (Addendum)
We will check MRI of the brain and check an EEG study.   Syncope Syncope is a medical term for fainting or passing out. This means you lose consciousness and drop to the ground. People are generally unconscious for less than 5 minutes. You may have some muscle twitches for up to 15 seconds before waking up and returning to normal. Syncope occurs more often in older adults, but it can happen to anyone. While most causes of syncope are not dangerous, syncope can be a sign of a serious medical problem. It is important to seek medical care.  CAUSES  Syncope is caused by a sudden drop in blood flow to the brain. The specific cause is often not determined. Factors that can bring on syncope include:  Taking medicines that lower blood pressure.  Sudden changes in posture, such as standing up quickly.  Taking more medicine than prescribed.  Standing in one place for too long.  Seizure disorders.  Dehydration and excessive exposure to heat.  Low blood sugar (hypoglycemia).  Straining to have a bowel movement.  Heart disease, irregular heartbeat, or other circulatory problems.  Fear, emotional distress, seeing blood, or severe pain. SYMPTOMS  Right before fainting, you may:  Feel dizzy or light-headed.  Feel nauseous.  See all white or all black in your field of vision.  Have cold, clammy skin. DIAGNOSIS  Your health care provider will ask about your symptoms, perform a physical exam, and perform an electrocardiogram (ECG) to record the electrical activity of your heart. Your health care provider may also perform other heart or blood tests to determine the cause of your syncope which may include:  Transthoracic echocardiogram (TTE). During echocardiography, sound waves are used to evaluate how blood flows through your heart.  Transesophageal echocardiogram (TEE).  Cardiac monitoring. This allows your health care provider to monitor your heart rate and rhythm in real  time.  Holter monitor. This is a portable device that records your heartbeat and can help diagnose heart arrhythmias. It allows your health care provider to track your heart activity for several days, if needed.  Stress tests by exercise or by giving medicine that makes the heart beat faster. TREATMENT  In most cases, no treatment is needed. Depending on the cause of your syncope, your health care provider may recommend changing or stopping some of your medicines. HOME CARE INSTRUCTIONS  Have someone stay with you until you feel stable.  Do not drive, use machinery, or play sports until your health care provider says it is okay.  Keep all follow-up appointments as directed by your health care provider.  Lie down right away if you start feeling like you might faint. Breathe deeply and steadily. Wait until all the symptoms have passed.  Drink enough fluids to keep your urine clear or pale yellow.  If you are taking blood pressure or heart medicine, get up slowly and take several minutes to sit and then stand. This can reduce dizziness. SEEK IMMEDIATE MEDICAL CARE IF:   You have a severe headache.  You have unusual pain in the chest, abdomen, or back.  You are bleeding from your mouth or rectum, or you have black or tarry stool.  You have an irregular or very fast heartbeat.  You have pain with breathing.  You have repeated fainting or seizure-like jerking during an episode.  You faint when sitting or lying down.  You have confusion.  You have trouble walking.  You have severe weakness.  You have vision  problems. If you fainted, call your local emergency services (911 in [place] Do not drive yourself to the hospital.    This information is not intended to replace advice given to you by your health care provider. Make sure you discuss any questions you have with your health care provider.   Document Released: 07/22/2005 Document Revised: 12/06/2014 Document Reviewed:  09/20/2011 Elsevier Interactive Patient Education [employer].

## 2015-08-10 ENCOUNTER — Other Ambulatory Visit: Payer: Medicare (Managed Care)

## 2015-08-15 ENCOUNTER — Encounter: Payer: Self-pay | Admitting: Neurology

## 2015-08-22 ENCOUNTER — Telehealth: Payer: Self-pay | Admitting: Neurology

## 2015-08-22 NOTE — Telephone Encounter (Signed)
Patient is calling and states she received a "no show" letter but that she did call in and cancel prior.

## 2015-09-16 ENCOUNTER — Telehealth: Payer: Self-pay | Admitting: Family Medicine

## 2015-09-16 NOTE — Telephone Encounter (Signed)
Pt called to see about wait time and when I told her we were steady she asked that this message be put in and to let dr Carlota Raspberry know that she will be in first thing on Sunday morning  [phone]

## 2015-09-16 NOTE — Telephone Encounter (Signed)
Patient has been experiencing terrible leg pain for 3 weeks. She states that a referral was supposed to be placed and she has not heard anything yet. Her last visit was in November 2016. She does not want to wait to see Dr. Carlota Raspberry because she states that she cannot sit that long. She says that she will keep calling to see if the wait time has been reduced. I advised patient that if her pain is that bad she needs to go to the ED to be checked.  [phone]

## 2015-09-17 ENCOUNTER — Ambulatory Visit (INDEPENDENT_AMBULATORY_CARE_PROVIDER_SITE_OTHER): Payer: Medicare Other | Admitting: Family Medicine

## 2015-09-17 VITALS — BP 102/60 | HR 90 | Temp 98.6°F | Resp 16 | Ht 60.0 in | Wt 160.0 lb

## 2015-09-17 DIAGNOSIS — R1084 Generalized abdominal pain: Secondary | ICD-10-CM

## 2015-09-17 DIAGNOSIS — M25561 Pain in right knee: Secondary | ICD-10-CM

## 2015-09-17 DIAGNOSIS — M545 Low back pain, unspecified: Secondary | ICD-10-CM

## 2015-09-17 DIAGNOSIS — D509 Iron deficiency anemia, unspecified: Secondary | ICD-10-CM | POA: Diagnosis not present

## 2015-09-17 DIAGNOSIS — R42 Dizziness and giddiness: Secondary | ICD-10-CM | POA: Diagnosis not present

## 2015-09-17 DIAGNOSIS — N926 Irregular menstruation, unspecified: Secondary | ICD-10-CM | POA: Diagnosis not present

## 2015-09-17 DIAGNOSIS — M797 Fibromyalgia: Secondary | ICD-10-CM

## 2015-09-17 DIAGNOSIS — M549 Dorsalgia, unspecified: Secondary | ICD-10-CM

## 2015-09-17 DIAGNOSIS — M25562 Pain in left knee: Secondary | ICD-10-CM

## 2015-09-17 DIAGNOSIS — R112 Nausea with vomiting, unspecified: Secondary | ICD-10-CM

## 2015-09-17 DIAGNOSIS — G8929 Other chronic pain: Secondary | ICD-10-CM

## 2015-09-17 DIAGNOSIS — R197 Diarrhea, unspecified: Secondary | ICD-10-CM | POA: Diagnosis not present

## 2015-09-17 LAB — POCT URINALYSIS DIP (MANUAL ENTRY)
BILIRUBIN UA: NEGATIVE
BILIRUBIN UA: NEGATIVE
Glucose, UA: NEGATIVE
Leukocytes, UA: NEGATIVE
NITRITE UA: NEGATIVE
PH UA: 6.5
Protein Ur, POC: NEGATIVE
SPEC GRAV UA: 1.02
Urobilinogen, UA: 0.2

## 2015-09-17 LAB — POCT CBC
GRANULOCYTE PERCENT: 59.5 % (ref 37–80)
HCT, POC: 32.8 % — AB (ref 37.7–47.9)
HEMOGLOBIN: 10.6 g/dL — AB (ref 12.2–16.2)
Lymph, poc: 2.3 (ref 0.6–3.4)
MCH: 23.5 pg — AB (ref 27–31.2)
MCHC: 32.3 g/dL (ref 31.8–35.4)
MCV: 72.8 fL — AB (ref 80–97)
MID (cbc): 0.3 (ref 0–0.9)
MPV: 8.6 fL (ref 0–99.8)
POC Granulocyte: 3.9 (ref 2–6.9)
POC LYMPH PERCENT: 35.5 %L (ref 10–50)
POC MID %: 5 %M (ref 0–12)
Platelet Count, POC: 282 10*3/uL (ref 142–424)
RBC: 4.5 M/uL (ref 4.04–5.48)
RDW, POC: 19.5 %
WBC: 6.5 10*3/uL (ref 4.6–10.2)

## 2015-09-17 LAB — COMPLETE METABOLIC PANEL WITH GFR
ALT: 11 U/L (ref 6–29)
AST: 17 U/L (ref 10–30)
Albumin: 3.9 g/dL (ref 3.6–5.1)
Alkaline Phosphatase: 65 U/L (ref 33–115)
BILIRUBIN TOTAL: 0.3 mg/dL (ref 0.2–1.2)
BUN: 9 mg/dL (ref 7–25)
CHLORIDE: 102 mmol/L (ref 98–110)
CO2: 28 mmol/L (ref 20–31)
CREATININE: 0.66 mg/dL (ref 0.50–1.10)
Calcium: 8.9 mg/dL (ref 8.6–10.2)
GFR, Est African American: >89 mL/min (ref >=60)
GFR, Est Non African American: >89 mL/min (ref >=60)
GLUCOSE: 92 mg/dL (ref 65–99)
Potassium: 3.4 mmol/L — ABNORMAL LOW (ref 3.5–5.3)
SODIUM: 136 mmol/L (ref 135–146)
TOTAL PROTEIN: 7.1 g/dL (ref 6.1–8.1)

## 2015-09-17 LAB — POC MICROSCOPIC URINALYSIS (UMFC)

## 2015-09-17 LAB — LIPASE: Lipase: 51 U/L (ref 7–60)

## 2015-09-17 LAB — POCT URINE PREGNANCY: PREG TEST UR: NEGATIVE

## 2015-09-17 MED ORDER — ONDANSETRON 4 MG PO TBDP
4.0000 mg | ORAL_TABLET | Freq: Once | ORAL | Status: AC
  Administered 2015-09-17: 4 mg via ORAL

## 2015-09-17 MED ORDER — ONDANSETRON 4 MG PO TBDP: 4.0000 mg | ORAL_TABLET | Freq: Three times a day (TID) | ORAL | Status: DC | PRN

## 2015-09-17 MED ORDER — LIDOCAINE 4 % EX CREA: 1.0000 "application " | TOPICAL_CREAM | Freq: Four times a day (QID) | CUTANEOUS | Status: DC | PRN

## 2015-09-17 NOTE — Patient Instructions (Addendum)
I suspect you have a viral illness causing the nausea, vomiting and diarrhea. This usually improves within a few days. You can take the Zofran every 8 hours as needed for the nausea and vomiting, small sips of fluids frequently, and see other information below. If your abdominal pain is worsening, or you're not able to keep fluids down or other worsening, return here or the emergency room for further evaluation and possible IV fluids.  It does appear the  orthopedic office tried to get in touch with you back in November. However I will submit another referral for orthopedics, make sure you follow up with them or our office if you have not received a phone call. I did send in a prescription for lidocaine as requested, but further evaluation and treatment of your back and leg symptoms needs to be completed by an orthopedist as we discussed. You also need to complete the request for records for pain management to review previous records in [place] prior to them seeing you. As you did not tolerate the previous medications for nerve pain or for fibromyalgia, I did not start any new medicines today. This can be discussed with the pain management provider.  Follow-up with neurologist as planned, as they did recommend an MRI of the brain which has not been performed to further evaluate symptoms in your legs, back, and dizziness.  I do not have a good explanation for your symptoms of sinus congestion/pressure, feeling of shortness of breath, and headache when sitting up and talking. You can try Claritin over-the-counter once per day to see if this may be allergy related, but if that does not improve your symptoms, let me know and I can refer you to a pulmonologist. If any worsening of symptoms, recommend evaluation through an emergency room.  I did complete a letter today indicating that your daughter is helping you at home, but please discuss this further with the pain management specialist and orthopedist to  determine if home health or home health physical therapy may be needed. They can order this, or if I need to order home health or PT,  would like more information from their evaluations to provide for home health.  If you continue to have irregular bleeding or clotting, return to discuss this further.    Your blood count is still a little bit low, but minimally better from last visit. If you can tolerate it, try to increase iron supplement to twice per day, and recheck levels in the next 6 weeks.  Return to the clinic or go to the nearest emergency room if any of your symptoms worsen or new symptoms occur.   Nausea and Vomiting Nausea is a sick feeling that often comes before throwing up (vomiting). Vomiting is a reflex where stomach contents come out of your mouth. Vomiting can cause severe loss of body fluids (dehydration). Children and elderly adults can become dehydrated quickly, especially if they also have diarrhea. Nausea and vomiting are symptoms of a condition or disease. It is important to find the cause of your symptoms. CAUSES   Direct irritation of the stomach lining. This irritation can result from increased acid production (gastroesophageal reflux disease), infection, food poisoning, taking certain medicines (such as nonsteroidal anti-inflammatory drugs), alcohol use, or tobacco use.  Signals from the brain.These signals could be caused by a headache, heat exposure, an inner ear disturbance, increased pressure in the brain from injury, infection, a tumor, or a concussion, pain, emotional stimulus, or metabolic problems.  An obstruction  in the gastrointestinal tract (bowel obstruction).  Illnesses such as diabetes, hepatitis, gallbladder problems, appendicitis, kidney problems, cancer, sepsis, atypical symptoms of a heart attack, or eating disorders.  Medical treatments such as chemotherapy and radiation.  Receiving medicine that makes you sleep (general anesthetic) during  surgery. DIAGNOSIS Your caregiver may ask for tests to be done if the problems do not improve after a few days. Tests may also be done if symptoms are severe or if the reason for the nausea and vomiting is not clear. Tests may include:  Urine tests.  Blood tests.  Stool tests.  Cultures (to look for evidence of infection).  X-rays or other imaging studies. Test results can help your caregiver make decisions about treatment or the need for additional tests. TREATMENT You need to stay well hydrated. Drink frequently but in small amounts.You may wish to drink water, sports drinks, clear broth, or eat frozen ice pops or gelatin dessert to help stay hydrated.When you eat, eating slowly may help prevent nausea.There are also some antinausea medicines that may help prevent nausea. HOME CARE INSTRUCTIONS   Take all medicine as directed by your caregiver.  If you do not have an appetite, do not force yourself to eat. However, you must continue to drink fluids.  If you have an appetite, eat a normal diet unless your caregiver tells you differently.  Eat a variety of complex carbohydrates (rice, wheat, potatoes, bread), lean meats, yogurt, fruits, and vegetables.  Avoid high-fat foods because they are more difficult to digest.  Drink enough water and fluids to keep your urine clear or pale yellow.  If you are dehydrated, ask your caregiver for specific rehydration instructions. Signs of dehydration may include:  Severe thirst.  Dry lips and mouth.  Dizziness.  Dark urine.  Decreasing urine frequency and amount.  Confusion.  Rapid breathing or pulse. SEEK IMMEDIATE MEDICAL CARE IF:   You have blood or brown flecks (like coffee grounds) in your vomit.  You have black or bloody stools.  You have a severe headache or stiff neck.  You are confused.  You have severe abdominal pain.  You have chest pain or trouble breathing.  You do not urinate at least once every 8  hours.  You develop cold or clammy skin.  You continue to vomit for longer than 24 to 48 hours.  You have a fever. MAKE SURE YOU:   Understand these instructions.  Will watch your condition.  Will get help right away if you are not doing well or get worse.   This information is not intended to replace advice given to you by your health care provider. Make sure you discuss any questions you have with your health care provider.   Document Released: 07/22/2005 Document Revised: 10/14/2011 Document Reviewed: 12/19/2010 Elsevier Interactive Patient Education 2016 [place].   Diarrhea Diarrhea is frequent loose and watery bowel movements. It can cause you to feel weak and dehydrated. Dehydration can cause you to become tired and thirsty, have a dry mouth, and have decreased urination that often is dark yellow. Diarrhea is a sign of another problem, most often an infection that will not last long. In most cases, diarrhea typically lasts 2-3 days. However, it can last longer if it is a sign of something more serious. It is important to treat your diarrhea as directed by your caregiver to lessen or prevent future episodes of diarrhea. CAUSES  Some common causes include:  Gastrointestinal infections caused by viruses, bacteria, or parasites.  Food poisoning or food allergies.  Certain medicines, such as antibiotics, chemotherapy, and laxatives.  Artificial sweeteners and fructose.  Digestive disorders. HOME CARE INSTRUCTIONS  Ensure adequate fluid intake (hydration): Have 1 cup (8 oz) of fluid for each diarrhea episode. Avoid fluids that contain simple sugars or sports drinks, fruit juices, whole milk products, and sodas. Your urine should be clear or pale yellow if you are drinking enough fluids. Hydrate with an oral rehydration solution that you can purchase at pharmacies, retail stores, and online. You can prepare an oral rehydration solution at home by mixing the following  ingredients together:   - tsp table salt.   tsp baking soda.   tsp salt substitute containing potassium chloride.  1  tablespoons sugar.  1 L (34 oz) of water.  Certain foods and beverages may increase the speed at which food moves through the gastrointestinal (GI) tract. These foods and beverages should be avoided and include:  Caffeinated and alcoholic beverages.  High-fiber foods, such as raw fruits and vegetables, nuts, seeds, and whole grain breads and cereals.  Foods and beverages sweetened with sugar alcohols, such as xylitol, sorbitol, and mannitol.  Some foods may be well tolerated and may help thicken stool including:  Starchy foods, such as rice, toast, pasta, low-sugar cereal, oatmeal, grits, baked potatoes, crackers, and bagels.  Bananas.  Applesauce.  Add probiotic-rich foods to help increase healthy bacteria in the GI tract, such as yogurt and fermented milk products.  Wash your hands well after each diarrhea episode.  Only take over-the-counter or prescription medicines as directed by your caregiver.  Take a warm bath to relieve any burning or pain from frequent diarrhea episodes. SEEK IMMEDIATE MEDICAL CARE IF:   You are unable to keep fluids down.  You have persistent vomiting.  You have blood in your stool, or your stools are black and tarry.  You do not urinate in 6-8 hours, or there is only a small amount of very dark urine.  You have abdominal pain that increases or localizes.  You have weakness, dizziness, confusion, or light-headedness.  You have a severe headache.  Your diarrhea gets worse or does not get better.  You have a fever or persistent symptoms for more than 2-3 days.  You have a fever and your symptoms suddenly get worse. MAKE SURE YOU:   Understand these instructions.  Will watch your condition.  Will get help right away if you are not doing well or get worse.   This information is not intended to replace advice  given to you by your health care provider. Make sure you discuss any questions you have with your health care provider.   Document Released: 07/12/2002 Document Revised: 08/12/2014 Document Reviewed: 03/29/2012 Elsevier Interactive Patient Education 2016 [employer]   Back Pain, Adult Back pain is very common in adults.The cause of back pain is rarely dangerous and the pain often gets better over time.The cause of your back pain may not be known. Some common causes of back pain include:  Strain of the muscles or ligaments supporting the spine.  Wear and tear (degeneration) of the spinal disks.  Arthritis.  Direct injury to the back. For many people, back pain may return. Since back pain is rarely dangerous, most people can learn to manage this condition on their own. HOME CARE INSTRUCTIONS Watch your back pain for any changes. The following actions may help to lessen any discomfort you are feeling:  Remain active. It is stressful on your back  to sit or stand in one place for long periods of time. Do not sit, drive, or stand in one place for more than 30 minutes at a time. Take short walks on even surfaces as soon as you are able.Try to increase the length of time you walk each day.  Exercise regularly as directed by your health care provider. Exercise helps your back heal faster. It also helps avoid future injury by keeping your muscles strong and flexible.  Do not stay in bed.Resting more than 1-2 days can delay your recovery.  Pay attention to your body when you bend and lift. The most comfortable positions are those that put less stress on your recovering back. Always use proper lifting techniques, including:  Bending your knees.  Keeping the load close to your body.  Avoiding twisting.  Find a comfortable position to sleep. Use a firm mattress and lie on your side with your knees slightly bent. If you lie on your back, put a pillow under your knees.  Avoid feeling  anxious or stressed.Stress increases muscle tension and can worsen back pain.It is important to recognize when you are anxious or stressed and learn ways to manage it, such as with exercise.  Take medicines only as directed by your health care provider. Over-the-counter medicines to reduce pain and inflammation are often the most helpful.Your health care provider may prescribe muscle relaxant drugs.These medicines help dull your pain so you can more quickly return to your normal activities and healthy exercise.  Apply ice to the injured area:  Put ice in a plastic bag.  Place a towel between your skin and the bag.  Leave the ice on for 20 minutes, 2-3 times a day for the first 2-3 days. After that, ice and heat may be alternated to reduce pain and spasms.  Maintain a healthy weight. Excess weight puts extra stress on your back and makes it difficult to maintain good posture. SEEK MEDICAL CARE IF:  You have pain that is not relieved with rest or medicine.  You have increasing pain going down into the legs or buttocks.  You have pain that does not improve in one week.  You have night pain.  You lose weight.  You have a fever or chills. SEEK IMMEDIATE MEDICAL CARE IF:   You develop new bowel or bladder control problems.  You have unusual weakness or numbness in your arms or legs.  You develop nausea or vomiting.  You develop abdominal pain.  You feel faint.   This information is not intended to replace advice given to you by your health care provider. Make sure you discuss any questions you have with your health care provider.   Document Released: 07/22/2005 Document Revised: 08/12/2014 Document Reviewed: 11/23/2013 Elsevier Interactive Patient Education 2016 [employer].   Iron Deficiency Anemia, Adult Anemia is a condition in which there are less red blood cells or hemoglobin in the blood than normal. Hemoglobin is the part of red blood cells that carries oxygen.  Iron deficiency anemia is anemia caused by too little iron. It is the most common type of anemia. It may leave you tired and short of breath. CAUSES   Lack of iron in the diet.  Poor absorption of iron, as seen with intestinal disorders.  Intestinal bleeding.  Heavy periods. SIGNS AND SYMPTOMS  Mild anemia may not be noticeable. Symptoms may include:  Fatigue.  Headache.  Pale skin.  Weakness.  Tiredness.  Shortness of breath.  Dizziness.  Cold hands  and feet.  Fast or irregular heartbeat. DIAGNOSIS  Diagnosis requires a thorough evaluation and physical exam by your health care provider. Blood tests are generally used to confirm iron deficiency anemia. Additional tests may be done to find the underlying cause of your anemia. These may include:  Testing for blood in the stool (fecal occult blood test).  A procedure to see inside the colon and rectum (colonoscopy).  A procedure to see inside the esophagus and stomach (endoscopy). TREATMENT  Iron deficiency anemia is treated by correcting the cause of the deficiency. Treatment may involve:  Adding iron-rich foods to your diet.  Taking iron supplements. Pregnant or breastfeeding women need to take extra iron because their normal diet usually does not provide the required amount.  Taking vitamins. Vitamin C improves the absorption of iron. Your health care provider may recommend that you take your iron tablets with a glass of orange juice or vitamin C supplement.  Medicines to make heavy menstrual flow lighter.  Surgery. HOME CARE INSTRUCTIONS   Take iron as directed by your health care provider.  If you cannot tolerate taking iron supplements by mouth, talk to your health care provider about taking them through a vein (intravenously) or an injection into a muscle.  For the best iron absorption, iron supplements should be taken on an empty stomach. If you cannot tolerate them on an empty stomach, you may need to  take them with food.  Do not drink milk or take antacids at the same time as your iron supplements. Milk and antacids may interfere with the absorption of iron.  Iron supplements can cause constipation. Make sure to include fiber in your diet to prevent constipation. A stool softener may also be recommended.  Take vitamins as directed by your health care provider.  Eat a diet rich in iron. Foods high in iron include liver, lean beef, whole-grain bread, eggs, dried fruit, and dark green leafy vegetables. SEEK IMMEDIATE MEDICAL CARE IF:   You faint. If this happens, do not drive. Call your local emergency services (911 in [place] if no other help is available.  You have chest pain.  You feel nauseous or vomit.  You have severe or increased shortness of breath with activity.  You feel weak.  You have a rapid heartbeat.  You have unexplained sweating.  You become light-headed when getting up from a chair or bed. MAKE SURE YOU:   Understand these instructions.  Will watch your condition.  Will get help right away if you are not doing well or get worse.   This information is not intended to replace advice given to you by your health care provider. Make sure you discuss any questions you have with your health care provider.   Document Released: 07/19/2000 Document Revised: 08/12/2014 Document Reviewed: 03/29/2013 Elsevier Interactive Patient Education [employer].

## 2015-09-17 NOTE — Progress Notes (Addendum)
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 length of time you walk each day.  Exercise regularly as directed by your health care provider. Exercise helps your back heal faster. It also helps avoid future injury by keeping your muscles strong and flexible.  Do not stay in bed.Resting more than 1-2 days can delay your recovery.  Pay attention to your body when you bend and lift. The most comfortable positions are those that put less stress on your recovering back. Always use proper lifting techniques, including:  Bending your knees.  Keeping the load close to your body.  Avoiding twisting.  Find a comfortable position to sleep. Use a firm mattress and lie on your side with your knees slightly bent. If you lie on your back, put a pillow under your knees.  Avoid feeling anxious or stressed.Stress increases muscle tension and can worsen back pain.It is important to recognize when you are anxious or stressed and  learn ways to manage it, such as with exercise.  Take medicines only as directed by your health care provider. Over-the-counter medicines to reduce pain and inflammation are often the most helpful.Your health care provider may prescribe muscle relaxant drugs.These medicines help dull your pain so you can more quickly return to your normal activities and healthy exercise.  Apply ice to the injured area:  Put ice in a plastic bag.  Place a towel between your skin and the bag.  Leave the ice on for 20 minutes, 2-3 times a day for the first 2-3 days. After that, ice and heat may be alternated to reduce pain and spasms.  Maintain a healthy weight. Excess weight puts extra stress on your back and makes it difficult to maintain good posture. SEEK MEDICAL CARE IF:  You have pain that is not relieved with rest or medicine.  You have increasing pain going down into the legs or buttocks.  You have pain that does not improve in one week.  You have night pain.  You lose weight.  You have a fever or chills. SEEK IMMEDIATE MEDICAL CARE IF:   You develop new bowel or bladder control problems.  You have unusual weakness or numbness in your arms or legs.  You develop nausea or vomiting.  You develop abdominal pain.  You feel faint.   This information is not intended to replace advice given to you by your health care provider. Make sure you discuss any questions you have with your health care provider.   Document Released: 07/22/2005 Document Revised: 08/12/2014 Document Reviewed: 11/23/2013 Elsevier Interactive Patient Education 2016 [employer].   Iron Deficiency Anemia, Adult Anemia is a condition in which there are less red blood cells or hemoglobin in the blood than normal. Hemoglobin is the part of red blood cells that carries oxygen.  Iron deficiency anemia is anemia caused by too little iron. It is the most common type of anemia. It may leave you tired and short of  breath. CAUSES   Lack of iron in the diet.  Poor absorption of iron, as seen with intestinal disorders.  Intestinal bleeding.  Heavy periods. SIGNS AND SYMPTOMS  Mild anemia may not be noticeable. Symptoms may include:  Fatigue.  Headache.  Pale skin.  Weakness.  Tiredness.  Shortness of breath.  Dizziness.  Cold hands and feet.  Fast or irregular heartbeat. DIAGNOSIS  Diagnosis requires a thorough evaluation and physical exam by your health care provider. Blood tests are generally used to confirm iron deficiency anemia. Additional tests may be done to find the underlying cause of your anemia. These may include:  Testing for blood in the stool (fecal occult blood test).  A procedure to see inside the colon and rectum (colonoscopy).  A procedure to see inside the esophagus and stomach (endoscopy). TREATMENT  Iron deficiency anemia is treated by correcting the cause of the deficiency. Treatment may involve:  Adding iron-rich foods to your diet.  Taking iron supplements. Pregnant or breastfeeding women need to take extra iron because their normal diet usually does not provide the required amount.  Taking vitamins. Vitamin C improves the absorption of iron. Your health care provider may recommend that you take your iron tablets with a glass of orange juice or vitamin C supplement.  Medicines to make heavy menstrual flow lighter.  Surgery. HOME CARE INSTRUCTIONS   Take iron as directed by your health care provider.  If you cannot tolerate taking iron supplements by mouth, talk to your health care provider about taking them through a vein (intravenously) or an injection into a muscle.  For the best iron absorption, iron supplements should be taken on an empty stomach. If you cannot tolerate them on an empty stomach, you may need to take them with food.  Do not drink milk or take antacids at the same time as your iron supplements. Milk and antacids may interfere  with the absorption of iron.  Iron supplements can cause constipation. Make sure to include fiber in your diet to prevent constipation. A stool softener may also be recommended.  Take vitamins as directed by your health care provider.  Eat a diet rich in iron. Foods high in iron include liver, lean beef, whole-grain bread, eggs, dried fruit, and dark green leafy vegetables. SEEK IMMEDIATE MEDICAL CARE IF:   You faint. If this happens, do not drive. Call your local emergency services (911 in [place] if no other help is available.  You have chest pain.  You feel nauseous or vomit.  You have severe or increased shortness of breath with activity.  You feel weak.  You have a rapid heartbeat.  You have unexplained sweating.  You become light-headed when getting up from a chair or bed. MAKE SURE YOU:   Understand these instructions.  Will watch your condition.  Will get help right away if you are not doing well or get worse.   This information is not intended to replace advice given to you by your health care provider. Make sure you discuss any questions you have with your health care provider.   Document Released: 07/19/2000 Document Revised: 08/12/2014 Document Reviewed: 03/29/2013 Elsevier Interactive Patient Education [employer].     I personally performed the services described in this documentation, which was scribed in my presence. The recorded information has been reviewed and considered,  and addended by me as needed.

## 2015-09-19 DIAGNOSIS — M549 Dorsalgia, unspecified: Secondary | ICD-10-CM

## 2015-09-19 DIAGNOSIS — D509 Iron deficiency anemia, unspecified: Secondary | ICD-10-CM | POA: Insufficient documentation

## 2015-09-19 DIAGNOSIS — G8929 Other chronic pain: Secondary | ICD-10-CM | POA: Insufficient documentation

## 2015-09-19 DIAGNOSIS — M797 Fibromyalgia: Secondary | ICD-10-CM | POA: Insufficient documentation

## 2015-09-19 NOTE — Telephone Encounter (Signed)
Pt did come see Dr. Carlota Raspberry on 2/12.

## 2015-09-22 DIAGNOSIS — Z0271 Encounter for disability determination: Secondary | ICD-10-CM

## 2015-09-26 DIAGNOSIS — M25551 Pain in right hip: Secondary | ICD-10-CM | POA: Diagnosis not present

## 2015-09-26 DIAGNOSIS — M25562 Pain in left knee: Secondary | ICD-10-CM | POA: Diagnosis not present

## 2015-09-26 DIAGNOSIS — M25561 Pain in right knee: Secondary | ICD-10-CM | POA: Diagnosis not present

## 2015-09-26 DIAGNOSIS — M545 Low back pain: Secondary | ICD-10-CM | POA: Diagnosis not present

## 2015-09-26 DIAGNOSIS — M25552 Pain in left hip: Secondary | ICD-10-CM | POA: Diagnosis not present

## 2015-09-30 ENCOUNTER — Encounter: Payer: Self-pay | Admitting: *Deleted

## 2015-10-13 ENCOUNTER — Telehealth: Payer: Self-pay

## 2015-10-13 DIAGNOSIS — M549 Dorsalgia, unspecified: Secondary | ICD-10-CM | POA: Diagnosis not present

## 2015-10-13 DIAGNOSIS — G894 Chronic pain syndrome: Secondary | ICD-10-CM | POA: Diagnosis not present

## 2015-10-13 DIAGNOSIS — R55 Syncope and collapse: Secondary | ICD-10-CM

## 2015-10-13 DIAGNOSIS — R42 Dizziness and giddiness: Secondary | ICD-10-CM

## 2015-10-13 NOTE — Telephone Encounter (Signed)
Patient is calling to request that we send her referal to [facility]. She prefers a female.  Also, patient want to know if we received records from her doctor in [place]. She doesn't rememeber the name of the office. If we didn't received the records patient will fax them or bring them in. Please call!  [phone]

## 2015-10-17 DIAGNOSIS — Z0271 Encounter for disability determination: Secondary | ICD-10-CM

## 2015-10-17 NOTE — Telephone Encounter (Signed)
Please place a new referral to neurology for this patient.  The old referral from 11/16 has already been linked to an appointment, so a new referral is needed.  Thank you.

## 2015-10-18 NOTE — Telephone Encounter (Signed)
Done

## 2015-10-23 DIAGNOSIS — G8929 Other chronic pain: Secondary | ICD-10-CM | POA: Diagnosis not present

## 2015-10-23 DIAGNOSIS — M25561 Pain in right knee: Secondary | ICD-10-CM | POA: Diagnosis not present

## 2015-10-23 DIAGNOSIS — M25562 Pain in left knee: Secondary | ICD-10-CM | POA: Diagnosis not present

## 2015-10-23 DIAGNOSIS — M545 Low back pain: Secondary | ICD-10-CM | POA: Diagnosis not present

## 2015-10-23 DIAGNOSIS — Z0271 Encounter for disability determination: Secondary | ICD-10-CM

## 2015-10-30 ENCOUNTER — Telehealth: Payer: Self-pay | Admitting: Family Medicine

## 2015-10-30 DIAGNOSIS — M25561 Pain in right knee: Secondary | ICD-10-CM | POA: Diagnosis not present

## 2015-10-30 DIAGNOSIS — M545 Low back pain: Principal | ICD-10-CM

## 2015-10-30 DIAGNOSIS — G8929 Other chronic pain: Secondary | ICD-10-CM

## 2015-10-30 DIAGNOSIS — M25562 Pain in left knee: Secondary | ICD-10-CM | POA: Diagnosis not present

## 2015-10-30 NOTE — Telephone Encounter (Signed)
Patient is in extreme pain in her lower back and knees. Patient stated her knees are burning. Stabbing pain in right leg. Patient experienced this from her physical therapy. Patient request to speak with Dr. Carlota Raspberry. [phone].

## 2015-10-31 NOTE — Telephone Encounter (Signed)
Dr. Carlota Raspberry, in your note you stated you were going to send her to ortho. I think the referral that was previously placed is expired. Please advise on what to do about the pain pt is having.

## 2015-11-01 NOTE — Telephone Encounter (Signed)
She was referred to ortho and pain management. She has been evaluated by Dr. Alfonso Ramus at [facility], and I reviewed note last night and placed in scan pile.  What is status of pain mgt eval? [place] were needed by ortho.

## 2015-11-03 NOTE — Telephone Encounter (Signed)
Called no VN set up yet

## 2015-11-06 NOTE — Telephone Encounter (Signed)
Spoke with pt, she states she keeps calling for the records from previous doctor but she states they will not call her back. She states she is still in pain. She does not know what else to do at this point.  [facility],  [place]

## 2015-11-06 NOTE — Telephone Encounter (Signed)
She would also like some Lidocaine cream if anything. Please advise Dr. Carlota Raspberry. I will try to get records from previous doctor. She also request a bigger tube if possible.

## 2015-11-07 MED ORDER — LIDOCAINE 4 % EX CREA: 1.0000 "application " | TOPICAL_CREAM | Freq: Four times a day (QID) | CUTANEOUS | Status: DC | PRN

## 2015-11-07 NOTE — Telephone Encounter (Signed)
She was referred to pain management in November of last year. Do we need a new referral, or what is the status of local pain management referral? I refilled the lidocaine cream for now. I did note the records from physical therapy and orthopedics.  Any help we can provide to get the previous records will help with further orthopedic and pain management eval. Thanks.

## 2015-11-08 ENCOUNTER — Encounter: Payer: Self-pay | Admitting: Neurology

## 2015-11-08 ENCOUNTER — Other Ambulatory Visit (INDEPENDENT_AMBULATORY_CARE_PROVIDER_SITE_OTHER): Payer: Medicare Other

## 2015-11-08 ENCOUNTER — Ambulatory Visit (INDEPENDENT_AMBULATORY_CARE_PROVIDER_SITE_OTHER): Payer: Medicare Other | Admitting: Neurology

## 2015-11-08 VITALS — BP 114/70 | HR 68 | Ht 61.0 in | Wt 160.0 lb

## 2015-11-08 DIAGNOSIS — R55 Syncope and collapse: Secondary | ICD-10-CM

## 2015-11-08 DIAGNOSIS — R209 Unspecified disturbances of skin sensation: Secondary | ICD-10-CM | POA: Diagnosis not present

## 2015-11-08 DIAGNOSIS — R200 Anesthesia of skin: Secondary | ICD-10-CM

## 2015-11-08 DIAGNOSIS — G894 Chronic pain syndrome: Secondary | ICD-10-CM

## 2015-11-08 DIAGNOSIS — G8929 Other chronic pain: Secondary | ICD-10-CM | POA: Diagnosis not present

## 2015-11-08 DIAGNOSIS — M549 Dorsalgia, unspecified: Secondary | ICD-10-CM

## 2015-11-08 LAB — VITAMIN B12: Vitamin B-12: 430 pg/mL (ref 211–911)

## 2015-11-08 LAB — SEDIMENTATION RATE: SED RATE: 31 mm/h — AB (ref 0–22)

## 2015-11-08 NOTE — Patient Instructions (Signed)
1.  We will check MRI of brain without contrast for syncope and numbness  2.  We will check labs:  ANA, Sed Rate, RF, B12 3.  If MRI unremarkable, will likely consider nerve study
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
## 2015-11-09 ENCOUNTER — Telehealth: Payer: Self-pay

## 2015-11-09 LAB — ANA: ANA: NEGATIVE

## 2015-11-09 LAB — RHEUMATOID FACTOR

## 2015-11-09 NOTE — Telephone Encounter (Signed)
-----   Message from Pieter Partridge, DO sent at 11/09/2015  2:07 PM EDT ----- Labs are unremarkable.  Sed Rate is mildly elevated but it is not significant

## 2015-11-09 NOTE — Telephone Encounter (Signed)
Results were left on pt's voicemail, with instructions to call back with any questions or concerns in relation to results.   

## 2015-11-09 NOTE — Telephone Encounter (Signed)
Sent a release to receive records from [facility]

## 2015-11-14 NOTE — Telephone Encounter (Signed)
Medical records can you look out for these?

## 2015-11-15 ENCOUNTER — Ambulatory Visit (INDEPENDENT_AMBULATORY_CARE_PROVIDER_SITE_OTHER): Payer: Medicare Other | Admitting: Neurology

## 2015-11-15 DIAGNOSIS — G894 Chronic pain syndrome: Secondary | ICD-10-CM | POA: Diagnosis not present

## 2015-11-15 DIAGNOSIS — R200 Anesthesia of skin: Secondary | ICD-10-CM

## 2015-11-15 DIAGNOSIS — R55 Syncope and collapse: Secondary | ICD-10-CM

## 2015-11-15 DIAGNOSIS — M549 Dorsalgia, unspecified: Secondary | ICD-10-CM

## 2015-11-15 DIAGNOSIS — G8929 Other chronic pain: Secondary | ICD-10-CM

## 2015-11-15 NOTE — Procedures (Signed)
ELECTROENCEPHALOGRAM REPORT  Date of Study: 11/15/2015  Patient's Name: Alexandria Boyle MRN: [id number] Date of Birth: 11-14-1975  Indication: 40 year old female with chronic pain and numbness who presents with episodes of syncope  Medications: Tizanidine Claritin ASA Zofran  Technical Summary: This is a multichannel digital EEG recording, using the international 10-20 placement system with electrodes applied with paste and impedances below 5000 ohms.    Description: The EEG background is symmetric, with a well-developed posterior dominant rhythm of 11 Hz, which is reactive to eye opening and closing.  Diffuse beta activity is seen, with a bilateral frontal preponderance.  No focal or generalized abnormalities are seen.  No focal or generalized epileptiform discharges are seen.  Stage II sleep is seen, with normal and symmetric sleep patterns.  Hyperventilation and photic stimulation were performed, and produced no abnormalities.  ECG revealed normal cardiac rate and rhythm.  Impression: This is a normal routine EEG of the awake and asleep states, with activating procedures.  A normal study does not rule out the possibility of a seizure disorder in this patient.  Aleira Deiter R. Tomi Likens, DO

## 2015-11-16 ENCOUNTER — Telehealth: Payer: Self-pay

## 2015-11-16 NOTE — Telephone Encounter (Signed)
-----   Message from Pieter Partridge, DO sent at 11/16/2015  7:12 AM EDT ----- EEG is normal

## 2015-11-16 NOTE — Telephone Encounter (Signed)
Message relayed. Pt verbalized understanding, denied questions.

## 2015-11-16 NOTE — Telephone Encounter (Signed)
Message relayed to patient. Verbalized understanding and denied questions.   

## 2015-11-17 NOTE — Telephone Encounter (Signed)
T,   We are now documenting receipt of requested records in the phone message center.  I have not seen anything yet, will continue to keep an eye open for them.

## 2015-11-21 ENCOUNTER — Telehealth: Payer: Self-pay | Admitting: Neurology

## 2015-11-21 ENCOUNTER — Telehealth: Payer: Self-pay

## 2015-11-21 NOTE — Telephone Encounter (Signed)
I recommend continuing with brain MRI.  I don't think MRI of back would help with figuring out cause of dizziness

## 2015-11-21 NOTE — Telephone Encounter (Signed)
Alexandria Boyle [birth year]. She called today wanting to see if by chance her MRI that is scheduled for tomorrow to scan brain and neck could be changed to check her back instead. She said she feels the problem is coming from her back and not her head. She said when she sits she feels nauseas and has blurry vision as well as her head feeling tight. She said when she stands up she feels a little better. She would like you to call her at [phone]. Thank you

## 2015-11-21 NOTE — Telephone Encounter (Signed)
Alexandria Boyle from [facility] is calling because a referral was put in but they still haven't received the records. She would like to know if the patient is going to continue with the referral. Please call [name]! [phone]

## 2015-11-21 NOTE — Telephone Encounter (Signed)
Pt aware.

## 2015-11-21 NOTE — Telephone Encounter (Signed)
Spoke with patient. Pt states she feels it (the dizziness) is coming from her back, not her head. States her dizziness starts after she is sitting down, and is relieved when she stands. States that since the other test (EEG) was normal her brain is fine. She would like MRI to be switched from a brain MRI to a back one. Please advise.

## 2015-11-22 ENCOUNTER — Ambulatory Visit
Admission: RE | Admit: 2015-11-22 | Discharge: 2015-11-22 | Disposition: A | Discharge status: Home / Self Care | Payer: Medicare Other | Source: Ambulatory Visit | Attending: Neurology | Admitting: Neurology

## 2015-11-22 DIAGNOSIS — G894 Chronic pain syndrome: Secondary | ICD-10-CM

## 2015-11-22 DIAGNOSIS — M549 Dorsalgia, unspecified: Secondary | ICD-10-CM

## 2015-11-22 DIAGNOSIS — R200 Anesthesia of skin: Secondary | ICD-10-CM

## 2015-11-22 DIAGNOSIS — R55 Syncope and collapse: Secondary | ICD-10-CM

## 2015-11-22 DIAGNOSIS — G8929 Other chronic pain: Secondary | ICD-10-CM

## 2015-11-22 NOTE — Telephone Encounter (Signed)
Called to advise to hold onto referral. I called pain  Mgmt in [place] to request records again.

## 2015-11-23 ENCOUNTER — Telehealth: Payer: Self-pay

## 2015-11-23 DIAGNOSIS — G629 Polyneuropathy, unspecified: Secondary | ICD-10-CM

## 2015-11-23 NOTE — Telephone Encounter (Signed)
Medical records look for these please.

## 2015-11-23 NOTE — Telephone Encounter (Signed)
-----   Message from Pieter Partridge, DO sent at 11/23/2015  7:19 AM EDT ----- MRI of brain is normal.  I would proceed with NCV-EMG to evaluate for polyneuropathy

## 2015-11-23 NOTE — Telephone Encounter (Signed)
Left message on machine for pt to return call to the office.  

## 2015-11-27 NOTE — Telephone Encounter (Signed)
The EMG should help determine if there is a problem with her back as well.

## 2015-11-27 NOTE — Telephone Encounter (Signed)
Pt would like to know if this (NCV) would pick up a "tear" in her muscle.

## 2015-11-27 NOTE — Telephone Encounter (Signed)
Left message on machine for pt to return call to the office.  

## 2015-11-27 NOTE — Telephone Encounter (Signed)
Pt still convinced this is all coming from her lower back. Pt states dizziness occurs while sitting and will dissipate when she stands. Please advise.

## 2015-12-06 NOTE — Telephone Encounter (Signed)
This should have been sent to referral pool to start with, not sure if this was done or not can someone please let Jonelle Sidle know. Thank you and sorry it wasn't routed to you guys sooner!

## 2015-12-07 NOTE — Telephone Encounter (Signed)
I left a message for the patient in January to fill out a release for her medical records from the [facility] facility.  Alexandria Boyle contacted the facility in April requesting her records.  Unfortunately, there is not anything we can do about her referral until [facility] or our office receives the records.

## 2015-12-12 ENCOUNTER — Encounter: Payer: Medicare Other | Admitting: Neurology

## 2015-12-29 ENCOUNTER — Telehealth: Payer: Self-pay

## 2015-12-29 NOTE — Telephone Encounter (Signed)
Pt claims she got a call from our staff about a medication. Nothing was documented.  Please advise  [phone]

## 2016-01-13 ENCOUNTER — Ambulatory Visit (INDEPENDENT_AMBULATORY_CARE_PROVIDER_SITE_OTHER): Payer: Medicare Other | Admitting: Family Medicine

## 2016-01-13 VITALS — BP 110/70 | HR 93 | Temp 98.7°F | Resp 18 | Ht 61.0 in | Wt 162.0 lb

## 2016-01-13 DIAGNOSIS — M545 Low back pain: Secondary | ICD-10-CM

## 2016-01-13 DIAGNOSIS — G894 Chronic pain syndrome: Secondary | ICD-10-CM | POA: Diagnosis not present

## 2016-01-13 DIAGNOSIS — R202 Paresthesia of skin: Secondary | ICD-10-CM

## 2016-01-13 DIAGNOSIS — M25562 Pain in left knee: Secondary | ICD-10-CM | POA: Diagnosis not present

## 2016-01-13 DIAGNOSIS — G8929 Other chronic pain: Secondary | ICD-10-CM | POA: Diagnosis not present

## 2016-01-13 DIAGNOSIS — R233 Spontaneous ecchymoses: Secondary | ICD-10-CM

## 2016-01-13 DIAGNOSIS — M797 Fibromyalgia: Secondary | ICD-10-CM

## 2016-01-13 DIAGNOSIS — M549 Dorsalgia, unspecified: Secondary | ICD-10-CM

## 2016-01-13 DIAGNOSIS — M25561 Pain in right knee: Secondary | ICD-10-CM | POA: Diagnosis not present

## 2016-01-13 DIAGNOSIS — R238 Other skin changes: Secondary | ICD-10-CM

## 2016-01-13 MED ORDER — LIDOCAINE 4 % EX LOTN: 1.0000 "application " | TOPICAL_LOTION | Freq: Four times a day (QID) | CUTANEOUS | Status: DC

## 2016-01-13 NOTE — Progress Notes (Signed)
Subjective:    Patient ID: Alexandria Boyle, female    DOB: January 22, 1976, 40 y.o.   MRN: [id number] Chief Complaint  Patient presents with  . Back Pain    Low back  . Knee Pain    Bilateral  . Medication Problem    Needs Lidocaine changed. Insurance will not pay    HPI  Alexandria Boyle is complaining of severe knee pain. She feels like she has a "migraine" in her knees. Right knee pain is constant but now left is worse.  Exquisitely sensitive to touch and pressure. She feels like she has a marble in her knees that is moving around and her knee caps feel like jelly. The cold air in our office is making all of this much worse. Her back is throbbing and burnings - she is scrapping bone-to-bone - like something is trying to heal (prior this only occurred when she had something warm on her back). Her feet are numb, Left side worsening and right always. Dizzy spells are returned some but not as much.  Pt has been seen at [facility], [facility], and [facility]. She can't afford the lidocaine cream that helps but a $30 tube is gone in 1 1/2 days.   She called yesterday and was told that Dr. Carlota Raspberry, her PCP, would be here today to complete some forms to help her forgive her credit care balance.  She has statements from her credit card that she wants signed stating that she is disabled to waive her bills. She does have a letter stating that she is on social security disability. Medicare began June 2015 and disability began Nov 2014  She is complaining of having bruising and tightness over her chest.   She was on blood thinners due to poor circulation in one of her legs but when she was taken off her dizziness improved so is still off so now on asa 81mg .  Her legs swell, she feels like one leg is better than the other.  Her knees feel like they are going to give out, like she is bone-on-bone.  She requests an exam of her back, legs, and knees today due to worsening pain.  Past  Medical History  Diagnosis Date  . Arthritis   . Back disorder   . Allergy   . Depression   . CRPS (complex regional pain syndrome type I)   . Fibromyalgia   . Paresthesia 07/13/2015  . Syncope and collapse 07/13/2015   Past Surgical History  Procedure Laterality Date  . Cesarean section     Current Outpatient Prescriptions on File Prior to Visit  Medication Sig Dispense Refill  . aspirin 81 MG tablet Take 81 mg by mouth daily.    . diclofenac sodium (VOLTAREN) 1 % GEL Apply 2 g topically 4 (four) times daily.    . ferrous sulfate 300 (60 FE) MG/5ML syrup Take 1.8 mLs twice daily with orange juice 110 mL 1  . loratadine (CLARITIN) 10 MG tablet Take 10 mg by mouth daily.    . ondansetron (ZOFRAN ODT) 4 MG disintegrating tablet Take 1 tablet (4 mg total) by mouth every 8 (eight) hours as needed for nausea or vomiting. 10 tablet 0  . tiZANidine (ZANAFLEX) 4 MG tablet Take 1 tablet (4 mg total) by mouth 2 (two) times daily. 20 tablet 0   No current facility-administered medications on file prior to visit.   Allergies  Allergen Reactions  . Bee Pollen   . Citrus   .  Lyrica [Pregabalin]   . Neurontin [Gabapentin]   . Strawberry Extract   . Tomato    Family History  Problem Relation Age of Onset  . Diabetes Mother   . Cancer Father   . Cancer Maternal Grandfather   . Diabetes Maternal Grandfather    Social History   Social History  . Marital Status: Single    Spouse Name: N/A  . Number of Children: 1  . Years of Education: 12   Occupational History  . photographer    Social History Main Topics  . Smoking status: Never Smoker   . Smokeless tobacco: Never Used  . Alcohol Use: No  . Drug Use: No  . Sexual Activity: Not Asked   Other Topics Concern  . None   Social History Narrative   Patient drinks about 1-2 cups of caffeine daily.   Patient is right handed.     Review of Systems  Constitutional: Positive for activity change and fatigue. Negative for fever,  chills and unexpected weight change.  Respiratory: Positive for chest tightness.   Cardiovascular: Positive for leg swelling.  Endocrine: Positive for cold intolerance.  Musculoskeletal: Positive for myalgias, back pain, joint swelling, arthralgias, gait problem, neck pain and neck stiffness.  Skin: Negative for color change and rash.  Neurological: Positive for dizziness, weakness, light-headedness, numbness and headaches. Negative for facial asymmetry.  Hematological: Bruises/bleeds easily.  Psychiatric/Behavioral: Positive for sleep disturbance.       Objective:  BP 110/70 mmHg  Pulse 93  Temp(Src) 98.7 F (37.1 C) (Oral)  Resp 18  Ht 5\' 1"  (1.549 m)  Wt 162 lb (73.483 kg)  BMI 30.63 kg/m2  SpO2 99%  LMP 01/09/2016  Physical Exam  Constitutional: She is oriented to person, place, and time. She appears well-developed and well-nourished. No distress.  HENT:  Head: Normocephalic and atraumatic.  Right Ear: External ear normal.  Eyes: Conjunctivae are normal. No scleral icterus.  Pulmonary/Chest: Effort normal.  Musculoskeletal:  Incredibly hyperesthetic - pt asked me to examine her low back and knees but was then unable to let me touch her due to pain so examine deffered.  Neurological: She is alert and oriented to person, place, and time.  Skin: Skin is warm and dry. She is not diaphoretic. No erythema.  Psychiatric: Her speech is normal and behavior is normal. Her mood appears anxious. She exhibits a depressed mood.          Assessment & Plan:  She was never able to get her records from the pain management clinic in [facility] and so she was never able to get into a pain clinic here.  Dr. Alfonso Ramus at [facility] saw pt but again after not having any prior records available felt he did not have anything to offer pt and recommend she follow-up with pain management. I completed papers today stating she is medially disabled so that her credit cart debt can be forgiven. Could  not find a HIPAA med record request for prior pain mnmnt records so resigned witnessed and faxed with copy scanned into chart.  1. Chronic back pain   2. Fibromyalgia - consider trial of compounded top cream - see AVS.  3. Paresthesia   4. Chronic pain syndrome   5. Knee pain, bilateral   6. Abnormal bruising   7. Chronic low back pain     Meds ordered this encounter  Medications  . Lidocaine 4 % LOTN    Sig: Apply 1 application topically 4 (four) times daily.  Dispense:  188 mL    Refill:  0    Ok to substitute any strength, preparation, form, quantity, etc if significantly less expensive for pt.    Delman Cheadle, M.D.  [facility] [address] [place], [place] [address] [phone] phone [phone] fax  01/20/2016 9:41 PM

## 2016-01-13 NOTE — Patient Instructions (Addendum)
IF you received an x-ray today, you will receive an invoice from [facility]. Please contact [facility] at [phone] with questions or concerns regarding your invoice.   IF you received labwork today, you will receive an invoice from [employer]. Please contact [facility] at [phone] with questions or concerns regarding your invoice.   Our billing staff will not be able to assist you with questions regarding bills from these companies.  You will be contacted with the lab results as soon as they are available. The fastest way to get your results is to activate your My Chart account. Instructions are located on the last page of this paperwork. If you have not heard from [place] regarding the results in 2 weeks, please contact this office.     Consider trying a compound cream from a on-line compounding pharmacy.  For instance, [facility] ([phone]) makes a product that has 6% gabapentin, 3% diclofenac, 2% cyclobenzaprine, 2% baclofen, and 1% bupivacaine (Called [id number]) and you apply 1-2 grams to painful joints 3-4 times daily and rub in throughly.  They normally send a 240grams at a time.  Also, you can get a 188g tube of lidocaine 3% lotion (rathar than the 30g tube for $30) for $40 at [facility] or [facility] if you download a coupon through [facility].  Myofascial Pain Syndrome and Fibromyalgia Myofascial pain syndrome and fibromyalgia are both pain disorders. This pain may be felt mainly in your muscles.   Myofascial pain syndrome:  Always has trigger points or tender points in the muscle that will cause pain when pressed. The pain may come and go.  Usually affects your neck, upper back, and shoulder areas. The pain often radiates into your arms and hands.  Fibromyalgia:  Has muscle pains and tenderness that come and go.  Is often associated with fatigue and sleep disturbances.  Has trigger points.  Tends to be  long-lasting (chronic), but is not life-threatening. Fibromyalgia and myofascial pain are not the same. However, they often occur together. If you have both conditions, each can make the other worse. Both are common and can cause enough pain and fatigue to make day-to-day activities difficult.  CAUSES  The exact causes of fibromyalgia and myofascial pain are not known. People with certain gene types may be more likely to develop fibromyalgia. Some factors can be triggers for both conditions, such as:   Spine disorders.  Arthritis.  Severe injury (trauma) and other physical stressors.  Being under a lot of stress.  A medical illness. SIGNS AND SYMPTOMS  Fibromyalgia The main symptom of fibromyalgia is widespread pain and tenderness in your muscles. This can vary over time. Pain is sometimes described as stabbing, shooting, or burning. You may have tingling or numbness, too. You may also have sleep problems and fatigue. You may wake up feeling tired and groggy (fibro fog). Other symptoms may include:   Bowel and bladder problems.  Headaches.  Visual problems.  Problems with odors and noises.  Depression or mood changes.  Painful menstrual periods (dysmenorrhea).  Dry skin or eyes. Myofascial pain syndrome Symptoms of myofascial pain syndrome include:   Tight, ropy bands of muscle.   Uncomfortable sensations in muscular areas, such as:  Aching.  Cramping.  Burning.  Numbness.  Tingling.   Muscle weakness.  Trouble moving certain muscles freely (range of motion). DIAGNOSIS  There are no specific tests to diagnose fibromyalgia or myofascial pain syndrome. Both can be hard to diagnose because their  symptoms are common in many other conditions. Your health care provider may suspect one or both of these conditions based on your symptoms and medical history. Your health care provider will also do a physical exam.  The key to diagnosing fibromyalgia is having pain,  fatigue, and other symptoms for more than three months that cannot be explained by another condition.  The key to diagnosing myofascial pain syndrome is finding trigger points in muscles that are tender and cause pain elsewhere in your body (referred pain). TREATMENT  Treating fibromyalgia and myofascial pain often requires a team of health care providers. This usually starts with your primary provider and a physical therapist. You may also find it helpful to work with alternative health care providers, such as massage therapists or acupuncturists. Treatment for fibromyalgia may include medicines. This may include nonsteroidal anti-inflammatory drugs (NSAIDs), along with other medicines.  Treatment for myofascial pain may also include:  NSAIDs.  Cooling and stretching of muscles.  Trigger point injections.  Sound wave (ultrasound) treatments to stimulate muscles. HOME CARE INSTRUCTIONS   Take medicines only as directed by your health care provider.  Exercise as directed by your health care provider or physical therapist.  Try to avoid stressful situations.  Practice relaxation techniques to control your stress. You may want to try:  Biofeedback.  Visual imagery.  Hypnosis.  Muscle relaxation.  Yoga.  Meditation.  Talk to your health care provider about alternative treatments, such as acupuncture or massage treatment.  Maintain a healthy lifestyle. This includes eating a healthy diet and getting enough sleep.  Consider joining a support group.  Do not do activities that stress or strain your muscles. That includes repetitive motions and heavy lifting. SEEK MEDICAL CARE IF:   You have new symptoms.  Your symptoms get worse.  You have side effects from your medicines.  You have trouble sleeping.  Your condition is causing depression or anxiety. FOR MORE INFORMATION   National Fibromyalgia Association: http://www.fmaware.orgwww.fmaware.[place]:  http://www.arthritis.orgwww.arthritis.org  American Chronic Pain Association: StreetWrestling.at.https://stevens.biz/   This information is not intended to replace advice given to you by your health care provider. Make sure you discuss any questions you have with your health care provider.   Document Released: 07/22/2005 Document Revised: 08/12/2014 Document Reviewed: 04/27/2014 Elsevier Interactive Patient Education [employer].

## 2016-02-01 ENCOUNTER — Telehealth: Payer: Self-pay

## 2016-02-01 NOTE — Telephone Encounter (Signed)
I am declining on writing this letter. Pt came in once thinking JG was going to be in so I completed some forms for her in the office.
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
## 2016-02-05 NOTE — Telephone Encounter (Signed)
I will get the papers out of his box and make copies, I will also fax it to the right places to see about them getting all their info. Thank you for this

## 2016-02-08 NOTE — Telephone Encounter (Signed)
Have you had a chance to read all these messages about writing a note?

## 2016-02-10 NOTE — Telephone Encounter (Signed)
I can review the paperwork, and complete information based on my office visits, but ultimately assessment of her disability may need to be completed by orthopedics or pain management. Now that we have some outside records, this should help with those assessments.  I will be back in the office on Monday and can review at that time.

## 2016-02-21 NOTE — Telephone Encounter (Signed)
She just wanted a letter written but I have called and spoke to the insurance company and explained what you had told me seeing her and how she needs to go to pain clinics and they were fine with it, so no more issues have come of it, thank you

## 2016-02-21 NOTE — Telephone Encounter (Signed)
I reviewed her paperwork, but I do not see a form for me to complete. Was there a  form to fill out, or just a letter regarding her conditions in previous visits?

## 2016-02-23 ENCOUNTER — Telehealth: Payer: Self-pay

## 2016-02-23 MED ORDER — ASPIRIN 81 MG PO TABS: 81.0000 mg | ORAL_TABLET | Freq: Every day | ORAL | Status: DC

## 2016-02-23 MED ORDER — LORATADINE 10 MG PO TABS: 10.0000 mg | ORAL_TABLET | Freq: Every day | ORAL | Status: DC

## 2016-02-23 NOTE — Telephone Encounter (Signed)
Patient states that when she was here last she asked for the provider to refill loratadine and prescribe asprin 81MG  EC because it's cheaper. Patient states that the lidocaine is too much money and she one has 6 of those left and 2 of the asprins left. Please advise! [phone] [facility] on [address]

## 2016-02-23 NOTE — Telephone Encounter (Signed)
Sent in

## 2016-02-23 NOTE — Telephone Encounter (Signed)
Can we send in Rx for aspirin and Loratadine?

## 2016-02-23 NOTE — Telephone Encounter (Signed)
Sent in a a years supply

## 2016-06-04 ENCOUNTER — Telehealth: Payer: Self-pay

## 2016-06-04 NOTE — Telephone Encounter (Signed)
Patient states that she is not getting any better and she is in a lot of pain, states she has a knot on her ankle and her toes are numb. Also that her spine is getting more stiff. She states that her knees are getting stiff also and she isn't sure why Dr Carlota Raspberry says there is nothing wrong with them because it feels like someone is scrapping her knee every time she walks or goes upstairs She wanted to let Dr Carlota Raspberry know these issues.  She states that she is trying pay off her balance so she can come back and be seen but it is hard because she is in so much pain.  Her call back number is [phone].

## 2016-06-07 NOTE — Telephone Encounter (Signed)
I have not seen patient since February of this year. At that time we had discussed follow-up with orthopedist and pain management to look into other options to treat her pain. We did discuss medications for nerve pain or to treat fibromyalgia, but based on her intolerance to those medications before (Savella, Cymbalta, gabapentin and pregabalin) new medications were declined at that visit.  It was noted on outside records that she may have complex regional pain syndrome including affecting both knees as well as central sensitization with fibromyalgia.   I would recommend that she call her orthopedist or pain management provider to discuss other treatment options, as she had received injections by pain management in [place].  If she needs a new referral, let us know.   Regarding the numbness, I do see where she has been evaluated by neurology, including an MRI of the brain without acute findings. It appears that they had also ordered a nerve conduction study.  Follow-up with neurology if that has not been performed, and would recommend discussing numbness in toes with neurology.  If any new areas or change in symptoms acutely, can return here or emergency room if needed.

## 2016-06-07 NOTE — Telephone Encounter (Signed)
Dr Carlota Raspberry, do you have any advice for pt?

## 2016-06-10 NOTE — Telephone Encounter (Signed)
I spoke with patient and advised her of Dr. Vonna Kotyk message.  She verbalized understanding and stated that when she gets her bills caught up, she would return here to see Dr. Carlota Raspberry and the neurologist.  I advised her that she could go to ED and they would let her make payments on her bill, collecting no money during the visit.  I further advised her that she could come here and we give a 55% discount for cash payment.  She declined both at this time due to expenses.

## 2016-08-12 DIAGNOSIS — H5213 Myopia, bilateral: Secondary | ICD-10-CM | POA: Diagnosis not present

## 2016-11-11 ENCOUNTER — Telehealth: Payer: Self-pay | Admitting: Family Medicine
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
## 2016-11-11 NOTE — Telephone Encounter (Signed)
Agree with recommendation of urgent evaluation in ER based on description of symptoms.

## 2016-11-11 NOTE — Telephone Encounter (Signed)
Pt told to go to er

## 2016-11-13 ENCOUNTER — Ambulatory Visit (INDEPENDENT_AMBULATORY_CARE_PROVIDER_SITE_OTHER): Payer: Medicare Other | Admitting: Emergency Medicine

## 2016-11-13 VITALS — BP 132/86 | HR 85 | Temp 99.6°F | Resp 17 | Ht 60.0 in | Wt 164.0 lb

## 2016-11-13 DIAGNOSIS — G8929 Other chronic pain: Secondary | ICD-10-CM | POA: Diagnosis not present

## 2016-11-13 DIAGNOSIS — M25561 Pain in right knee: Secondary | ICD-10-CM

## 2016-11-13 DIAGNOSIS — M545 Low back pain: Secondary | ICD-10-CM | POA: Diagnosis not present

## 2016-11-13 DIAGNOSIS — G894 Chronic pain syndrome: Secondary | ICD-10-CM

## 2016-11-13 MED ORDER — DICLOFENAC EPOLAMINE 1.3 % TD PTCH: 1.0000 | MEDICATED_PATCH | Freq: Two times a day (BID) | TRANSDERMAL | 2 refills | Status: DC

## 2016-11-13 MED ORDER — MELOXICAM 7.5 MG PO TABS: 7.5000 mg | ORAL_TABLET | Freq: Every day | ORAL | 0 refills | Status: DC
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
## 2016-11-13 NOTE — Patient Instructions (Signed)
     IF you received an x-ray today, you will receive an invoice from [facility]. Please contact [facility] at [phone] with questions or concerns regarding your invoice.   IF you received labwork today, you will receive an invoice from [facility]. Please contact [facility] at [phone] with questions or concerns regarding your invoice.   Our billing staff will not be able to assist you with questions regarding bills from these companies.  You will be contacted with the lab results as soon as they are available. The fastest way to get your results is to activate your My Chart account. Instructions are located on the last page of this paperwork. If you have not heard from us regarding the results in 2 weeks, please contact this office.     

## 2016-11-18 ENCOUNTER — Telehealth: Payer: Self-pay | Admitting: Emergency Medicine
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
## 2016-11-20 NOTE — Telephone Encounter (Signed)
Don't know this patient well enough to write these letters so I don't feel comfortable doing so. She's a chronic pain patient so she should have her PCP/chronic pain management team do this for her.

## 2016-11-22 NOTE — Telephone Encounter (Signed)
This is for medical records--

## 2016-11-23 ENCOUNTER — Telehealth: Payer: Self-pay

## 2016-11-23 NOTE — Telephone Encounter (Signed)
Need pa on flector patch 1.3%  [id number] Id [id number]

## 2016-11-25 NOTE — Telephone Encounter (Signed)
I have not seen patient since 09/2015, and was referred to pain management and orthopaedics.  Would need to see her to review needs and to provide letter.

## 2016-11-26 NOTE — Telephone Encounter (Signed)
Medical records

## 2016-11-27 ENCOUNTER — Telehealth: Payer: Self-pay

## 2016-11-27 ENCOUNTER — Telehealth: Payer: Self-pay | Admitting: Emergency Medicine

## 2016-11-27 NOTE — Telephone Encounter (Signed)
I called pt and advised her of [place]'s response to the letters.  She states that she cannot afford to be seen again to have the letters rewritten.  She states that they can be the exact same letters that Carlota Raspberry had written previously, they just need a new date on them.  Pt is dropping off SCAT forms today to be filled out in order for her to go to the orthopedist as advised by Sag[place]ia.  Pain management has not contacted pt to set up an appt yet.  Please advise with pt about these letters.  [phone]

## 2016-11-27 NOTE — Telephone Encounter (Signed)
Dr. Suzette Battiest. Alexandria Boyle, Patient called to inquire about the status of her pain patches.  I looked in the chart and Santiago Glad had taken the message, but it never came to me. I reassured patient that I would start the process today and it could take up to 72 business hours for an outcome.  She said that was fine.  I apologized to her for the mixup, but she said not to worry. She then stated that she needs a letter to continue her disability payments.  She said she saw Dr. Mitchel Honour on the 21st and he did not feel comfortable writing the letter for her since this was her only visit with him.  She said that Dr. Carlota Boyle could also write the letter if he would.  The same letter Dr. Carlota Boyle wrote before is fine, but the date would need to be changed.   She is also dropping off a form to be completed for her to ride the [place]AT bus because the city bus is to hard to get to from her house, and she needs something that will pick her up.   Please advise about the letter and SCAT form.  I am working on the PA now for the patches.  I am supposed to call her back once I get an approval or denial. Thank you, Lavonda Thal

## 2016-11-27 NOTE — Telephone Encounter (Signed)
Started PA on patches. There should be a decision within 48-72 hrs.

## 2016-11-28 NOTE — Telephone Encounter (Signed)
We can reprint the letters from last year or provide new letter, but it should read that information is based on her last visit with me in 2017.

## 2016-12-02 NOTE — Telephone Encounter (Signed)
Patient states that the letters from last year are ok, but she needs an update added regarding her referral to orthopedics and that she is not improving.  She also needs her SCAT forms updated.  Transportation is a problem for her, and that is making it difficult to come in to see Dr. Carlota Raspberry, bring and pick up forms, etc.  Sent her a text link to set up My Chart account and advised that images can be uploaded with her forms, etc.  She also wants Dr. Carlota Raspberry to know that: Patches were denied. The two creams are too expensive, costing her $30. Needs something for her foot, pain and swelling.

## 2016-12-04 NOTE — Telephone Encounter (Signed)
1. We have prescribed lidocaine 4% cream and voltaren 1%gel.  Are those the two creams she is referring to that are too expensive?  In review of Dr. Raul Del note last year, other cream options were recommended: "Consider trying a compound cream from a on-line compounding pharmacy.  For instance, [facility] [phone]) makes a product that has 6% gabapentin, 3% diclofenac, 2% cyclobenzaprine, 2% baclofen, and 1% bupivacaine (Called [id number]) and you apply 1-2 grams to painful joints 3-4 times daily and rub in throughly.  They normally send a 240grams at a time.  Also, you can get a 188g tube of lidocaine 3% lotion (rathar than the 30g tube for $30) for $40 at [facility] or [facility] if you download a coupon through [employer]."  2. I am unable to recommend any other specific treatment for her foot pain and swelling as I have not evaluated her since February of 2017.  Based on review of her records form previous practice, she has been diagnosed with complex regional pain syndrome with fibromyalgia and will need to be followed by pain management to determine best course of action in treatment of her pain.   3. I do not see the SCAT forms.  Were those given to Dr. Mitchel Honour?  4. Would [facility] be lower cost option for her for follow up if cost prohibitive here?    5. I completed letter (in nurse box).

## 2016-12-05 NOTE — Telephone Encounter (Signed)
Dr. Mitchel Honour, Received a denial on pain patches for this patient yesterday.  Insurance company would like a substitute for them and possibly an antibiotic.Please advise next step.   Thank you, Robbie Nangle

## 2016-12-05 NOTE — Telephone Encounter (Signed)
Please disregard previous message as Dr. Carlota Raspberry has already addressed it.

## 2016-12-05 NOTE — Telephone Encounter (Signed)
Dr. Mitchel Honour, I called and notified patient of Dr. Vonna Kotyk note.  Patient stated she has the SCAT forms still, but she will drop them off before we close tonight.  She said she will try the cream Dr. Carlota Raspberry called in for her since her patches were denied.  She wanted me to let you know that she thinks the meloxicam is giving her a yeast infection and she is wondering if you can call her in a diflucan with a refill on it. I told her that meloxicam is not an antibiotic, but she insists it is having that effect on her body. Thank you, Vieva Brummitt

## 2016-12-05 NOTE — Telephone Encounter (Signed)
Perhaps insurance company or pharmacy can help [place] find a covered substitute. Antibiotic??? What for? Pt needs follow up with pain management.

## 2016-12-12 NOTE — Telephone Encounter (Signed)
Called to check on status of patient's claim, and they told me it was denied.  I requested an appeal, and they are faxing me the forms.  I will fill out what I can and send them on to Dr. Mitchel Honour for his signature.

## 2016-12-16 ENCOUNTER — Telehealth: Payer: Self-pay

## 2016-12-16 NOTE — Telephone Encounter (Signed)
Pt needs scat bus form filled out to the best of your ability so she can get [facility] service to appts. Form in your box

## 2016-12-16 NOTE — Telephone Encounter (Signed)
In greens box now

## 2016-12-16 NOTE — Telephone Encounter (Signed)
Should go to Dr. Nyoka Cowden.

## 2016-12-16 NOTE — Telephone Encounter (Signed)
Patient called and states that she has been using the cream Dr. Carlota Raspberry sent her, but it is not doing much help. It feels like she has broken her leg. She twisted her leg 2 days ago.  She can't bear weight on it. It is throbbing and popping. She further states that her legs and hands are swelling, and the pain is shooting into her left pelvic areas. She states that her body feels numb and is cramped up. The right side is in more pain than the left. She says she is worried that a "disc has shifted in her spine".  She said she is "passing out" and is unable to do much.  She said she is unable to do daily chores like washing clothes.  She has chills and has even more pain with touch. She can not be up for even short periods so she spends a lot of time in the bed. She has scheduled an appointment to see you on Wednesday.

## 2016-12-18 ENCOUNTER — Ambulatory Visit: Payer: Medicare Other | Admitting: Emergency Medicine

## 2016-12-24 ENCOUNTER — Ambulatory Visit (INDEPENDENT_AMBULATORY_CARE_PROVIDER_SITE_OTHER): Payer: Medicare Other | Admitting: Family Medicine

## 2016-12-24 ENCOUNTER — Encounter: Payer: Self-pay | Admitting: Family Medicine

## 2016-12-24 VITALS — BP 125/83 | HR 87 | Temp 99.2°F | Resp 18 | Ht 59.06 in | Wt 166.2 lb

## 2016-12-24 DIAGNOSIS — B373 Candidiasis of vulva and vagina: Secondary | ICD-10-CM

## 2016-12-24 DIAGNOSIS — N898 Other specified noninflammatory disorders of vagina: Secondary | ICD-10-CM

## 2016-12-24 DIAGNOSIS — G9050 Complex regional pain syndrome I, unspecified: Secondary | ICD-10-CM

## 2016-12-24 DIAGNOSIS — G894 Chronic pain syndrome: Secondary | ICD-10-CM | POA: Diagnosis not present

## 2016-12-24 DIAGNOSIS — B3731 Acute candidiasis of vulva and vagina: Secondary | ICD-10-CM

## 2016-12-24 LAB — POCT WET + KOH PREP
Trich by wet prep: ABSENT
YEAST BY WET PREP: ABSENT

## 2016-12-24 MED ORDER — LIDOCAINE 5 % EX OINT: 1.0000 "application " | TOPICAL_OINTMENT | CUTANEOUS | 1 refills | Status: DC | PRN

## 2016-12-24 MED ORDER — FLUCONAZOLE 150 MG PO TABS: 150.0000 mg | ORAL_TABLET | Freq: Once | ORAL | 0 refills | Status: AC

## 2016-12-24 MED ORDER — NORTRIPTYLINE HCL 10 MG PO CAPS: 10.0000 mg | ORAL_CAPSULE | Freq: Every day | ORAL | 0 refills | Status: DC

## 2016-12-24 NOTE — Telephone Encounter (Signed)
Completed and given to patient today in office
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
## 2016-12-24 NOTE — Patient Instructions (Addendum)
Because you are unable to tolerate a speculum exam, I would recommend gynecology eval if you continue to have vaginal irritation, but there were signs of yeast infection as well as possible bacterial vaginosis.  Start with treatment of yeast infection with Diflucan. If not improved with that dose, further evaluation and exam may be needed or could try treatment for bacterial vaginosis.  Lubricant such as KY jelly, or vagisil may also provide some temporary relief.   Please return to discuss any new pains or other new symptoms if needed, but as we discussed a pain management specialist will need to treat your pains. I have completed your paperwork for the [employer] system which should help with transportation limitations for your appointments.   If you have any persistent numbness or tingling in your pelvic area I would recommend that you be evaluated by neurology, Orthopedics,  or if you feel your symptoms are acutely worsening, be seen in the emergency room.   Can try to start nortriptyline 10mg  at bedtime as that was recommended by previous pain management provider. Stop if new side effects or worse symptoms. Pain management can titrate that medication or change it if needed.  Return to the clinic or go to the nearest emergency room if any of your symptoms worsen or new symptoms occur.   IF you received an x-ray today, you will receive an invoice from [facility]. Please contact [facility] at [phone] with questions or concerns regarding your invoice.   IF you received labwork today, you will receive an invoice from [place]. Please contact [facility] at [phone] with questions or concerns regarding your invoice.   Our billing staff will not be able to assist you with questions regarding bills from these companies.  You will be contacted with the lab results as soon as they are available. The fastest way to get your results is to activate your My Chart account. Instructions  are located on the last page of this paperwork. If you have not heard from [place] regarding the results in 2 weeks, please contact this office.    '

## 2016-12-25 ENCOUNTER — Other Ambulatory Visit: Payer: Self-pay

## 2016-12-25 NOTE — Telephone Encounter (Signed)
Can we change to liquid and send?

## 2016-12-25 NOTE — Telephone Encounter (Signed)
Pt is still having allergy issues eyes are swelling the allergy pills are not working and would like to have the syrup cetirizine called in to [facility]   Best number for patient is [phone]   She also called back to let me know that the fax number to [facility] records is [phone] to get the information regarding the liquid prescription

## 2016-12-25 NOTE — Telephone Encounter (Signed)
Received voice mal yesterday from patient from where I had been out of the office. When I tried to call her back the phone was not working.  I tried again this morning and it still was not in service.

## 2016-12-25 NOTE — Telephone Encounter (Signed)
Pt called multiple times stating her eyes are itchy and swollen and has hives. Denies sob  She had generic zyrtec in [place] and it worked better than claritan I advised getting otc zyrtec or benadryl for now til Md responds, she states she has no money to buy otc. I advised I cant rx new med , it has to be from provider. She verbalizes understanding

## 2016-12-26 NOTE — Telephone Encounter (Signed)
Liquid Zyrtec should not be any more effective than cetirizine pills.  Cetirizine 10 mg pills are over-the-counter. If her eyes are swelling and she has hives, should be taking Benadryl 25-50 mg every 4-6 hours. If that does not help within a few hours, or any worsening symptoms such as shortness of breath or throat symptoms,, should proceed to emergency room or call 911.

## 2016-12-26 NOTE — Telephone Encounter (Signed)
She states she cant afford the otc and gets rx cheap  The other info was given to her

## 2016-12-27 MED ORDER — CETIRIZINE HCL 5 MG/5ML PO SOLN: 10.0000 mg | Freq: Every day | ORAL | 0 refills | Status: DC

## 2016-12-27 NOTE — Telephone Encounter (Signed)
Ok.  Ordered cetirizine.

## 2017-02-14 ENCOUNTER — Telehealth: Payer: Self-pay | Admitting: Family Medicine

## 2017-02-14 NOTE — Telephone Encounter (Signed)
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 I have completed the form based off the last one that Dr Brigitte Pulse had done in 2017. I will place the forms in Dr Vonna Kotyk box on 02/14/17 please sign and return them to the FMLA/Disability box at the 102 checkout desk within 5-7 business days. Thank you!

## 2017-02-19 ENCOUNTER — Ambulatory Visit (INDEPENDENT_AMBULATORY_CARE_PROVIDER_SITE_OTHER): Payer: Medicare Other | Admitting: Emergency Medicine

## 2017-02-19 ENCOUNTER — Other Ambulatory Visit: Payer: Self-pay | Admitting: Family Medicine

## 2017-02-19 ENCOUNTER — Encounter: Payer: Self-pay | Admitting: Emergency Medicine

## 2017-02-19 VITALS — BP 123/82 | HR 98 | Temp 98.9°F | Resp 18 | Ht 59.0 in | Wt 171.4 lb

## 2017-02-19 DIAGNOSIS — B373 Candidiasis of vulva and vagina: Secondary | ICD-10-CM

## 2017-02-19 DIAGNOSIS — N898 Other specified noninflammatory disorders of vagina: Secondary | ICD-10-CM | POA: Diagnosis not present

## 2017-02-19 DIAGNOSIS — R200 Anesthesia of skin: Secondary | ICD-10-CM | POA: Diagnosis not present

## 2017-02-19 DIAGNOSIS — N939 Abnormal uterine and vaginal bleeding, unspecified: Secondary | ICD-10-CM | POA: Diagnosis not present

## 2017-02-19 DIAGNOSIS — B3731 Acute candidiasis of vulva and vagina: Secondary | ICD-10-CM

## 2017-02-19 DIAGNOSIS — G894 Chronic pain syndrome: Secondary | ICD-10-CM

## 2017-02-19 MED ORDER — LIDOCAINE 5 % EX OINT: 1.0000 "application " | TOPICAL_OINTMENT | CUTANEOUS | 1 refills | Status: DC | PRN

## 2017-02-19 MED ORDER — CETIRIZINE HCL 5 MG/5ML PO SOLN: 10.0000 mg | Freq: Every day | ORAL | 3 refills | Status: DC

## 2017-02-19 NOTE — Progress Notes (Signed)
Alexandria Boyle 41 y.o.   Chief Complaint  Patient presents with  . Numbness    Pelvic area, PER PT may have forgot and left in tampon  . Medication Refill    Lidocaine, zyrtec, would likie yeast infection pill    HISTORY OF PRESENT ILLNESS: This is a 41 y.o. female thinks she may have tampon still inside her vagina; c/o abnormal discharge at times bloody; has h/o chronic pelvic numbness; recently diagnosed with yeast infection; referred to [facility] and scheduled for next month. Pt has h/o chronic pain.  HPI   Prior to Admission medications   Medication Sig Start Date End Date Taking? Authorizing Provider  aspirin 81 MG tablet Take 1 tablet (81 mg total) by mouth daily. 02/23/16  Yes Shawnee Knapp, MD  cetirizine HCl (ZYRTEC) 5 MG/5ML SOLN Take 10 mLs (10 mg total) by mouth daily. 02/19/17  Yes Jillana Selph, Ines Bloomer, MD  diclofenac (FLECTOR) 1.3 % PTCH Place 1 patch onto the skin 2 (two) times daily. 11/13/16  Yes Rik Wadel, Ines Bloomer, MD  diclofenac sodium (VOLTAREN) 1 % GEL Apply 2 g topically 4 (four) times daily.   Yes [provider]  ferrous sulfate 300 (60 FE) MG/5ML syrup Take 1.8 mLs twice daily with orange juice 06/21/15  Yes Philis Fendt L, PA-C  lidocaine (XYLOCAINE) 5 % ointment Apply 1 application topically as needed. Up to 4 times per day to affected area. 02/19/17  Yes Caitlynn Ju, Ines Bloomer, MD  loratadine (CLARITIN) 10 MG tablet Take 1 tablet (10 mg total) by mouth daily. 02/23/16  Yes Shawnee Knapp, MD  meloxicam (MOBIC) 7.5 MG tablet Take 1 tablet (7.5 mg total) by mouth daily. 11/13/16  Yes Avonelle Viveros, Ines Bloomer, MD  nortriptyline (PAMELOR) 10 MG capsule Take 1 capsule (10 mg total) by mouth at bedtime. 12/24/16  Yes Wendie Agreste, MD  ondansetron (ZOFRAN ODT) 4 MG disintegrating tablet Take 1 tablet (4 mg total) by mouth every 8 (eight) hours as needed for nausea or vomiting. 09/17/15  Yes Wendie Agreste, MD  tiZANidine (ZANAFLEX) 4 MG tablet Take 1 tablet (4  mg total) by mouth 2 (two) times daily. 05/18/15  Yes Janne Napoleon, NP    Allergies  Allergen Reactions  . Bee Pollen   . Citrus   . Lyrica [Pregabalin]   . Neurontin [Gabapentin]   . Strawberry Extract   . Tomato     Patient Active Problem List   Diagnosis Date Noted  . Chronic back pain 09/19/2015  . Anemia, iron deficiency 09/19/2015  . Fibromyalgia 09/19/2015  . Paresthesia 07/13/2015  . Syncope and collapse 07/13/2015    Past Medical History:  Diagnosis Date  . Allergy   . Arthritis   . Back disorder   . CRPS (complex regional pain syndrome type I)   . Depression   . Fibromyalgia   . Paresthesia 07/13/2015  . Syncope and collapse 07/13/2015    Past Surgical History:  Procedure Laterality Date  . CESAREAN SECTION      Social History   Social History  . Marital status: Single    Spouse name: N/A  . Number of children: 1  . Years of education: 70   Occupational History  . photographer    Social History Main Topics  . Smoking status: Never Smoker  . Smokeless tobacco: Never Used  . Alcohol use No  . Drug use: No  . Sexual activity: No   Other Topics Concern  . Not on file  Social History Narrative   Patient drinks about 1-2 cups of caffeine daily.   Patient is right handed.     Family History  Problem Relation Age of Onset  . Diabetes Mother   . Cancer Father   . Cancer Maternal Grandfather   . Diabetes Maternal Grandfather      Review of Systems  Constitutional: Negative.  Negative for chills and fever.  HENT: Negative.  Negative for nosebleeds and sore throat.   Eyes: Negative.  Negative for blurred vision and double vision.  Respiratory: Negative.  Negative for cough and shortness of breath.   Cardiovascular: Negative.  Negative for chest pain and palpitations.  Gastrointestinal: Negative for abdominal pain, diarrhea, nausea and vomiting.  Genitourinary: Negative for dysuria and hematuria.  Skin: Negative.  Negative for rash.   Endo/Heme/Allergies: Negative.   All other systems reviewed and are negative.  Vitals:   02/19/17 1225  BP: 123/82  Pulse: 98  Resp: 18  Temp: 98.9 F (37.2 C)     Physical Exam  Constitutional: She is oriented to person, place, and time. She appears well-developed and well-nourished.  HENT:  Head: Normocephalic and atraumatic.  Eyes: Pupils are equal, round, and reactive to light. EOM are normal.  Neck: Normal range of motion. Neck supple.  Cardiovascular: Normal rate and regular rhythm.   Pulmonary/Chest: Effort normal and breath sounds normal.  Abdominal: Soft. There is no tenderness. Hernia confirmed negative in the right inguinal area and confirmed negative in the left inguinal area.  Genitourinary: There is no rash, tenderness or lesion on the right labia. There is no rash, tenderness or lesion on the left labia. Cervix exhibits no motion tenderness and no discharge. No erythema in the vagina. No foreign body in the vagina. Vaginal discharge (brownish) found.  Lymphadenopathy:       Right: No inguinal adenopathy present.       Left: No inguinal adenopathy present.  Neurological: She is alert and oriented to person, place, and time. No sensory deficit. She exhibits normal muscle tone.  Skin: Skin is warm and dry. Capillary refill takes less than 2 seconds.  Psychiatric: She has a normal mood and affect. Her behavior is normal.  Vitals reviewed.    ASSESSMENT & PLAN: [name] was seen today for numbness and medication refill.  Diagnoses and all orders for this visit:  Numbness Comments: pelvic  Abnormal vaginal bleeding  Chronic pain syndrome  Vaginal discharge -     GC/Chlamydia Probe Amp  Other orders -     cetirizine HCl (ZYRTEC) 5 MG/5ML SOLN; Take 10 mLs (10 mg total) by mouth daily. -     lidocaine (XYLOCAINE) 5 % ointment; Apply 1 application topically as needed. Up to 4 times per day to affected area.   Patient Instructions    F/U with GYNMD  asap.   IF you received an x-ray today, you will receive an invoice from [facility]. Please contact [facility] at [phone] with questions or concerns regarding your invoice.   IF you received labwork today, you will receive an invoice from [place]. Please contact [facility] at [phone] with questions or concerns regarding your invoice.   Our billing staff will not be able to assist you with questions regarding bills from these companies.  You will be contacted with the lab results as soon as they are available. The fastest way to get your results is to activate your My Chart account. Instructions are located on the last page of this paperwork. If  you have not heard from [place] regarding the results in 2 weeks, please contact this office.         Agustina Caroli, MD [facility]

## 2017-02-19 NOTE — Patient Instructions (Addendum)
  F/U with GYNMD asap.   IF you received an x-ray today, you will receive an invoice from [facility]. Please contact [facility] at [phone] with questions or concerns regarding your invoice.   IF you received labwork today, you will receive an invoice from [place]. Please contact [facility] at [phone] with questions or concerns regarding your invoice.   Our billing staff will not be able to assist you with questions regarding bills from these companies.  You will be contacted with the lab results as soon as they are available. The fastest way to get your results is to activate your My Chart account. Instructions are located on the last page of this paperwork. If you have not heard from [place] regarding the results in 2 weeks, please contact this office.

## 2017-02-20 NOTE — Telephone Encounter (Signed)
Paperwork completed. Placed in FMLA box.  

## 2017-02-21 LAB — GC/CHLAMYDIA PROBE AMP
CHLAMYDIA, DNA PROBE: NEGATIVE
Neisseria gonorrhoeae by PCR: NEGATIVE

## 2017-02-24 NOTE — Telephone Encounter (Signed)
Paperwork scanned and faxed on 02/24/17

## 2017-02-28 ENCOUNTER — Inpatient Hospital Stay (HOSPITAL_COMMUNITY): Payer: Medicare Other

## 2017-02-28 ENCOUNTER — Encounter (HOSPITAL_COMMUNITY): Payer: Self-pay | Admitting: *Deleted

## 2017-02-28 ENCOUNTER — Inpatient Hospital Stay (HOSPITAL_COMMUNITY)
Admission: AD | Admit: 2017-02-28 | Discharge: 2017-02-28 | Disposition: A | Discharge status: Home / Self Care | Payer: Medicare Other | Source: Ambulatory Visit | Attending: Obstetrics and Gynecology | Admitting: Obstetrics and Gynecology

## 2017-02-28 DIAGNOSIS — O3411 Maternal care for benign tumor of corpus uteri, first trimester: Secondary | ICD-10-CM | POA: Diagnosis not present

## 2017-02-28 DIAGNOSIS — D259 Leiomyoma of uterus, unspecified: Secondary | ICD-10-CM | POA: Diagnosis not present

## 2017-02-28 DIAGNOSIS — O09521 Supervision of elderly multigravida, first trimester: Secondary | ICD-10-CM

## 2017-02-28 DIAGNOSIS — Z3A01 Less than 8 weeks gestation of pregnancy: Secondary | ICD-10-CM | POA: Diagnosis not present

## 2017-02-28 DIAGNOSIS — O208 Other hemorrhage in early pregnancy: Secondary | ICD-10-CM | POA: Insufficient documentation

## 2017-02-28 DIAGNOSIS — O209 Hemorrhage in early pregnancy, unspecified: Secondary | ICD-10-CM

## 2017-02-28 HISTORY — DX: Reserved for concepts with insufficient information to code with codable children: IMO0002

## 2017-02-28 LAB — CBC
HCT: 30.1 % — ABNORMAL LOW (ref 36.0–46.0)
Hemoglobin: 9.5 g/dL — ABNORMAL LOW (ref 12.0–15.0)
MCH: 23.8 pg — ABNORMAL LOW (ref 26.0–34.0)
MCHC: 31.6 g/dL (ref 30.0–36.0)
MCV: 75.3 fL — ABNORMAL LOW (ref 78.0–100.0)
PLATELETS: 261 10*3/uL (ref 150–400)
RBC: 4 MIL/uL (ref 3.87–5.11)
RDW: 18.6 % — AB (ref 11.5–15.5)
WBC: 7.9 10*3/uL (ref 4.0–10.5)

## 2017-02-28 LAB — WET PREP, GENITAL
CLUE CELLS WET PREP: NONE SEEN
SPERM: NONE SEEN
TRICH WET PREP: NONE SEEN
YEAST WET PREP: NONE SEEN

## 2017-02-28 LAB — URINALYSIS, ROUTINE W REFLEX MICROSCOPIC
Bilirubin Urine: NEGATIVE
GLUCOSE, UA: NEGATIVE mg/dL
Ketones, ur: 5 mg/dL — AB
LEUKOCYTES UA: NEGATIVE
NITRITE: NEGATIVE
PH: 6 (ref 5.0–8.0)
Protein, ur: NEGATIVE mg/dL
SPECIFIC GRAVITY, URINE: 1.003 — AB (ref 1.005–1.030)

## 2017-02-28 LAB — POCT PREGNANCY, URINE: Preg Test, Ur: POSITIVE — AB

## 2017-02-28 LAB — HCG, QUANTITATIVE, PREGNANCY: HCG, BETA CHAIN, QUANT, S: 20097 m[IU]/mL — AB (ref <5)

## 2017-02-28 LAB — ABO/RH: ABO/RH(D): A POS

## 2017-02-28 NOTE — MAU Note (Addendum)
+  HPT, has been spotting for past wk and a half.  Started as brown, became red yesterday.  Was at [facility] today, sat for 2 hrs, was still not seen.  When told them about increase in bleeding- they instructed her to come here. Pain/pressure in LLQ, "heaviness" in lower abd, kind of fluttery

## 2017-02-28 NOTE — Discharge Instructions (Signed)
Uterine Fibroids Uterine fibroids are tissue masses (tumors) that can develop in the womb (uterus). They are also called leiomyomas. This type of tumor is not cancerous (benign) and does not spread to other parts of the body outside of the pelvic area, which is between the hip bones. Occasionally, fibroids may develop in the fallopian tubes, in the cervix, or on the support structures (ligaments) that surround the uterus. You can have one or many fibroids. Fibroids can vary in size, weight, and where they grow in the uterus. Some can become quite large. Most fibroids do not require medical treatment. What are the causes? A fibroid can develop when a single uterine cell keeps growing (replicating). Most cells in the human body have a control mechanism that keeps them from replicating without control. What are the signs or symptoms? Symptoms may include:  Heavy bleeding during your period.  Bleeding or spotting between periods.  Pelvic pain and pressure.  Bladder problems, such as needing to urinate more often (urinary frequency) or urgently.  Inability to reproduce offspring (infertility).  Miscarriages.  How is this diagnosed? Uterine fibroids are diagnosed through a physical exam. Your health care provider may feel the lumpy tumors during a pelvic exam. Ultrasonography and an MRI may be done to determine the size, location, and number of fibroids. How is this treated? Treatment may include:  Watchful waiting. This involves getting the fibroid checked by your health care provider to see if it grows or shrinks. Follow your health care provider's recommendations for how often to have this checked.  Hormone medicines. These can be taken by mouth or given through an intrauterine device (IUD).  Surgery. ? Removing the fibroids (myomectomy) or the uterus (hysterectomy). ? Removing blood supply to the fibroids (uterine artery embolization).  If fibroids interfere with your fertility and you  want to become pregnant, your health care provider may recommend having the fibroids removed. Follow these instructions at home:  Keep all follow-up visits as directed by your health care provider. This is important.  Take over-the-counter and prescription medicines only as told by your health care provider. ? If you were prescribed a hormone treatment, take the hormone medicines exactly as directed.  Ask your health care provider about taking iron pills and increasing the amount of dark green, leafy vegetables in your diet. These actions can help to boost your blood iron levels, which may be affected by heavy menstrual bleeding.  Pay close attention to your period and tell your health care provider about any changes, such as: ? Increased blood flow that requires you to use more pads or tampons than usual per month. ? A change in the number of days that your period lasts per month. ? A change in symptoms that are associated with your period, such as abdominal cramping or back pain. Contact a health care provider if:  You have pelvic pain, back pain, or abdominal cramps that cannot be controlled with medicines.  You have an increase in bleeding between and during periods.  You soak tampons or pads in a half hour or less.  You feel lightheaded, extra tired, or weak. Get help right away if:  You faint.  You have a sudden increase in pelvic pain. This information is not intended to replace advice given to you by your health care provider. Make sure you discuss any questions you have with your health care provider. Document Released: 07/19/2000 Document Revised: 03/21/2016 Document Reviewed: 01/18/2014 Elsevier Interactive Patient Education  2018 [employer]. Vaginal  Bleeding During Pregnancy, First Trimester A small amount of bleeding (spotting) from the vagina is relatively common in early pregnancy. It usually stops on its own. Various things may cause bleeding or spotting in early  pregnancy. Some bleeding may be related to the pregnancy, and some may not. In most cases, the bleeding is normal and is not a problem. However, bleeding can also be a sign of something serious. Be sure to tell your health care provider about any vaginal bleeding right away. Some possible causes of vaginal bleeding during the first trimester include:  Infection or inflammation of the cervix.  Growths (polyps) on the cervix.  Miscarriage or threatened miscarriage.  Pregnancy tissue has developed outside of the uterus and in a fallopian tube (tubal pregnancy).  Tiny cysts have developed in the uterus instead of pregnancy tissue (molar pregnancy).  Follow these instructions at home: Watch your condition for any changes. The following actions may help to lessen any discomfort you are feeling:  Follow your health care provider's instructions for limiting your activity. If your health care provider orders bed rest, you may need to stay in bed and only get up to use the bathroom. However, your health care provider may allow you to continue light activity.  If needed, make plans for someone to help with your regular activities and responsibilities while you are on bed rest.  Keep track of the number of pads you use each day, how often you change pads, and how soaked (saturated) they are. Write this down.  Do not use tampons. Do not douche.  Do not have sexual intercourse or orgasms until approved by your health care provider.  If you pass any tissue from your vagina, save the tissue so you can show it to your health care provider.  Only take over-the-counter or prescription medicines as directed by your health care provider.  Do not take aspirin because it can make you bleed.  Keep all follow-up appointments as directed by your health care provider.  Contact a health care provider if:  You have any vaginal bleeding during any part of your pregnancy.  You have cramps or labor  pains.  You have a fever, not controlled by medicine. Get help right away if:  You have severe cramps in your back or belly (abdomen).  You pass large clots or tissue from your vagina.  Your bleeding increases.  You feel light-headed or weak, or you have fainting episodes.  You have chills.  You are leaking fluid or have a gush of fluid from your vagina.  You pass out while having a bowel movement. This information is not intended to replace advice given to you by your health care provider. Make sure you discuss any questions you have with your health care provider. Document Released: 05/01/2005 Document Revised: 12/28/2015 Document Reviewed: 03/29/2013 Elsevier Interactive Patient Education  [employer].

## 2017-02-28 NOTE — MAU Provider Note (Signed)
History     CSN: [id number]  Arrival date and time: 02/28/17 1637   First Provider Initiated Contact with Patient 02/28/17 1714      Chief Complaint  Patient presents with  . Vaginal Bleeding  . Abdominal Pain  . Possible Pregnancy   Alexandria Boyle is a 41 y.o. G2P1001 at [redacted]w[redacted]d presenting with vaginal bleeding. She had and first positive HPT yesterday. She began having brown spotting one and a half weeks ago occurring daily. Yesterday spotting became redder and associated with lower abdominal pressure sensation and cramping intermittently.  Pregnancy unplanned but accepting and will follow with [facility].    Vaginal Bleeding  The patient's primary symptoms include missed menses, pelvic pain and vaginal bleeding. The patient's pertinent negatives include no genital itching, genital lesions or vaginal discharge. This is a recurrent problem. The current episode started in the past 7 days. The problem occurs intermittently. Associated symptoms include abdominal pain and frequency. Pertinent negatives include no back pain, constipation, diarrhea, dysuria, fever, hematuria, nausea or urgency.       Past Medical History:  Diagnosis Date  . Allergy   . Arthritis   . Back disorder   . Complex regional pain syndrome   . CRPS (complex regional pain syndrome type I)   . Depression   . Fibromyalgia   . Paresthesia 07/13/2015  . Syncope and collapse 07/13/2015    Past Surgical History:  Procedure Laterality Date  . CESAREAN SECTION      Family History  Problem Relation Age of Onset  . Diabetes Mother   . Cancer Father   . Cancer Maternal Grandfather   . Diabetes Maternal Grandfather     Social History  Substance Use Topics  . Smoking status: Never Smoker  . Smokeless tobacco: Never Used  . Alcohol use No    Allergies:  Allergies  Allergen Reactions  . Bee Pollen   . Citrus   . Lyrica [Pregabalin]   . Neurontin [Gabapentin]   . Strawberry Extract   .  Tomato     Prescriptions Prior to Admission  Medication Sig Dispense Refill Last Dose  . aspirin 81 MG tablet Take 1 tablet (81 mg total) by mouth daily. 90 tablet 4 Taking  . cetirizine HCl (ZYRTEC) 5 MG/5ML SOLN Take 10 mLs (10 mg total) by mouth daily. 300 mL 3   . diclofenac (FLECTOR) 1.3 % PTCH Place 1 patch onto the skin 2 (two) times daily. 10 patch 2 Taking  . diclofenac sodium (VOLTAREN) 1 % GEL Apply 2 g topically 4 (four) times daily.   Taking  . ferrous sulfate 300 (60 FE) MG/5ML syrup Take 1.8 mLs twice daily with orange juice 110 mL 1 Taking  . lidocaine (XYLOCAINE) 5 % ointment Apply 1 application topically as needed. Up to 4 times per day to affected area. 30 g 1   . loratadine (CLARITIN) 10 MG tablet Take 1 tablet (10 mg total) by mouth daily. 90 tablet 4 Taking  . meloxicam (MOBIC) 7.5 MG tablet Take 1 tablet (7.5 mg total) by mouth daily. 30 tablet 0 Taking  . nortriptyline (PAMELOR) 10 MG capsule Take 1 capsule (10 mg total) by mouth at bedtime. 30 capsule 0 Taking  . ondansetron (ZOFRAN ODT) 4 MG disintegrating tablet Take 1 tablet (4 mg total) by mouth every 8 (eight) hours as needed for nausea or vomiting. 10 tablet 0 Taking  . tiZANidine (ZANAFLEX) 4 MG tablet Take 1 tablet (4 mg total) by mouth  2 (two) times daily. 20 tablet 0 Taking    Review of Systems  Constitutional: Positive for fatigue. Negative for fever.  Gastrointestinal: Positive for abdominal pain. Negative for constipation, diarrhea and nausea.       Lower abdominal cramping  Genitourinary: Positive for frequency, missed menses, pelvic pain and vaginal bleeding. Negative for dysuria, genital sores, hematuria, urgency and vaginal discharge.  Musculoskeletal: Negative for back pain.   Physical Exam   Blood pressure 122/74, pulse 86, temperature 98.7 F (37.1 C), temperature source Oral, resp. rate 18, weight 172 lb 4 oz (78.1 kg), last menstrual period 01/18/2017, SpO2 100 %.  Physical Exam  Nursing  note and vitals reviewed. Constitutional: She is oriented to person, place, and time. She appears well-developed and well-nourished. No distress.  HENT:  Head: Normocephalic.  Eyes: No scleral icterus.  Neck: Neck supple.  Cardiovascular: Normal rate.   Respiratory: Effort normal.  Genitourinary: No vaginal discharge found.  Genitourinary Comments: Speculum: NEFG Vagina with scant dark blood, swabbed from vault Cx: nulliparaous, no lesiosn, no active bleeding noted  SVE: cx thick, closed Uterus: firm, tender, 8-10 wk size Adnexae: no masses noted DT: no FHR heard  Musculoskeletal: Normal range of motion.  Neurological: She is alert and oriented to person, place, and time.  Skin: Skin is warm and dry.  Psychiatric: She has a normal mood and affect. Her behavior is normal.    MAU Course  Procedures Results for orders placed or performed during the hospital encounter of 02/28/17 (from the past 24 hour(s))  Urinalysis, Routine w reflex microscopic     Status: Abnormal   Collection Time: 02/28/17  4:46 PM  Result Value Ref Range   Color, Urine STRAW (A) YELLOW   APPearance CLEAR CLEAR   Specific Gravity, Urine 1.003 (L) 1.005 - 1.030   pH 6.0 5.0 - 8.0   Glucose, UA NEGATIVE NEGATIVE mg/dL   Hgb urine dipstick LARGE (A) NEGATIVE   Bilirubin Urine NEGATIVE NEGATIVE   Ketones, ur 5 (A) NEGATIVE mg/dL   Protein, ur NEGATIVE NEGATIVE mg/dL   Nitrite NEGATIVE NEGATIVE   Leukocytes, UA NEGATIVE NEGATIVE   RBC / HPF 0-5 0 - 5 RBC/hpf   WBC, UA 0-5 0 - 5 WBC/hpf   Bacteria, UA RARE (A) NONE SEEN   Squamous Epithelial / LPF 0-5 (A) NONE SEEN  Pregnancy, urine POC     Status: Abnormal   Collection Time: 02/28/17  4:57 PM  Result Value Ref Range   Preg Test, Ur POSITIVE (A) NEGATIVE  Wet prep, genital     Status: Abnormal   Collection Time: 02/28/17  5:29 PM  Result Value Ref Range   Yeast Wet Prep HPF POC NONE SEEN NONE SEEN   Trich, Wet Prep NONE SEEN NONE SEEN   Clue Cells  Wet Prep HPF POC NONE SEEN NONE SEEN   WBC, Wet Prep HPF POC MODERATE (A) NONE SEEN   Sperm NONE SEEN   CBC     Status: Abnormal   Collection Time: 02/28/17  5:56 PM  Result Value Ref Range   WBC 7.9 4.0 - 10.5 K/uL   RBC 4.00 3.87 - 5.11 MIL/uL   Hemoglobin 9.5 (L) 12.0 - 15.0 g/dL   HCT 30.1 (L) 36.0 - 46.0 %   MCV 75.3 (L) 78.0 - 100.0 fL   MCH 23.8 (L) 26.0 - 34.0 pg   MCHC 31.6 30.0 - 36.0 g/dL   RDW 18.6 (H) 11.5 - 15.5 %   Platelets 261  150 - 400 K/uL  ABO/Rh     Status: None (Preliminary result)   Collection Time: 02/28/17  5:56 PM  Result Value Ref Range   ABO/RH(D) A POS   hCG, quantitative, pregnancy     Status: Abnormal   Collection Time: 02/28/17  5:56 PM  Result Value Ref Range   hCG, Beta Chain, Quant, S 20,097 (H) <5 mIU/mL   US Ob Comp Less 14 Wks  Result Date: 02/28/2017 CLINICAL DATA:  Vaginal bleeding for the past 2 days. Five weeks and 6 days pregnant by last menstrual period. EXAM: OBSTETRIC <14 WK [place] AND TRANSVAGINAL OB US TECHNIQUE: Both transabdominal and transvaginal ultrasound examinations were performed for complete evaluation of the gestation as well as the maternal uterus, adnexal regions, and pelvic cul-de-sac. Transvaginal technique was performed to assess early pregnancy. COMPARISON:  None. FINDINGS: Intrauterine gestational sac: Visualized, irregular in shape. Yolk sac:  Visualized Embryo:  Not visualized MSD: 13.8  mm   6 w   1  d Subchorionic hemorrhage:  Small Maternal uterus/adnexae: Right ovarian corpus luteum cyst. Normal appearing left ovary. Multiple rounded uterine masses. The largest measures 4.6 x 4.4 x 2.4 cm. Trace free peritoneal fluid. IMPRESSION: 1. Intrauterine gestational sac and yolk sac with an estimated gestational age of [redacted] weeks and 1 day. 2. No fetal pole seen at this time. This does not exclude a normal intrauterine gestation. Recommend follow-up quantitative B-HCG levels and follow-up US in 14 days to assess viability. This  recommendation follows SRU consensus guidelines: Diagnostic Criteria for Nonviable Pregnancy Early in the First Trimester. [place] [employer] Med 2013; 938:1017-51. 3. The gestational sac is irregular. This could be due to fetal demise or deformity of the sac by uterine fibroids. 4. Small subchorionic hemorrhage. 5. Multiple uterine fibroids, the largest measuring 4.6 cm. Electronically Signed   By: Claudie Revering M.D.   On: 02/28/2017 19:21   US Ob Transvaginal  Result Date: 02/28/2017 CLINICAL DATA:  Vaginal bleeding for the past 2 days. Five weeks and 6 days pregnant by last menstrual period. EXAM: OBSTETRIC <14 WK [place] AND TRANSVAGINAL OB US TECHNIQUE: Both transabdominal and transvaginal ultrasound examinations were performed for complete evaluation of the gestation as well as the maternal uterus, adnexal regions, and pelvic cul-de-sac. Transvaginal technique was performed to assess early pregnancy. COMPARISON:  None. FINDINGS: Intrauterine gestational sac: Visualized, irregular in shape. Yolk sac:  Visualized Embryo:  Not visualized MSD: 13.8  mm   6 w   1  d Subchorionic hemorrhage:  Small Maternal uterus/adnexae: Right ovarian corpus luteum cyst. Normal appearing left ovary. Multiple rounded uterine masses. The largest measures 4.6 x 4.4 x 2.4 cm. Trace free peritoneal fluid. IMPRESSION: 1. Intrauterine gestational sac and yolk sac with an estimated gestational age of [redacted] weeks and 1 day. 2. No fetal pole seen at this time. This does not exclude a normal intrauterine gestation. Recommend follow-up quantitative B-HCG levels and follow-up US in 14 days to assess viability. This recommendation follows SRU consensus guidelines: Diagnostic Criteria for Nonviable Pregnancy Early in the First Trimester. [place] [employer] Med 2013; 025:8527-78. 3. The gestational sac is irregular. This could be due to fetal demise or deformity of the sac by uterine fibroids. 4. Small subchorionic hemorrhage. 5. Multiple uterine fibroids, the largest  measuring 4.6 cm. Electronically Signed   By: Claudie Revering M.D.   On: 02/28/2017 19:21   MDM C/W Dr. Philis Pique will discharge home with reassurance and bleeding precautions. Discussed fibroids, intrauterine location  of pregnancy and undetermined viability with patient.   Assessment and Plan   1. Vaginal bleeding affecting early pregnancy   2. Bleeding in early pregnancy   3. Uterine leiomyoma, unspecified location   4. Elderly multigravida in first trimester    Allergies as of 02/28/2017      Reactions   Bee Pollen    Citrus    Lyrica [pregabalin]    Neurontin [gabapentin]    Strawberry Extract    Tomato       Medication List    STOP taking these medications   BENGAY EX   diclofenac 1.3 % Ptch Commonly known as:  FLECTOR   lidocaine 5 % ointment Commonly known as:  XYLOCAINE   loratadine 10 MG tablet Commonly known as:  CLARITIN   meloxicam 7.5 MG tablet Commonly known as:  MOBIC   MUSCLE RELIEF EX   nortriptyline 10 MG capsule Commonly known as:  PAMELOR   tiZANidine 4 MG tablet Commonly known as:  ZANAFLEX   VITAMIN D PO     TAKE these medications   aspirin 81 MG tablet Take 1 tablet (81 mg total) by mouth daily.   cetirizine HCl 5 MG/5ML Soln Commonly known as:  Zyrtec Take 10 mLs (10 mg total) by mouth daily.   ferrous sulfate 325 (65 FE) MG tablet Take 325 mg by mouth daily with breakfast. What changed:  Another medication with the same name was removed. Continue taking this medication, and follow the directions you see here.   VITAMIN C PO Take 1 tablet by mouth daily.      Follow-up Information    [facility]yn, [facility]. Schedule an appointment as soon as possible for a visit in 1 week(s).   Contact information: [place] [place] [address] [phone]

## 2017-03-13 ENCOUNTER — Encounter (HOSPITAL_COMMUNITY): Payer: Self-pay

## 2017-03-13 ENCOUNTER — Telehealth: Payer: Self-pay | Admitting: Emergency Medicine

## 2017-03-13 ENCOUNTER — Inpatient Hospital Stay (HOSPITAL_COMMUNITY)
Admission: AD | Admit: 2017-03-13 | Discharge: 2017-03-13 | Disposition: A | Discharge status: Home / Self Care | Payer: Medicare Other | Source: Ambulatory Visit | Attending: Obstetrics and Gynecology | Admitting: Obstetrics and Gynecology

## 2017-03-13 ENCOUNTER — Inpatient Hospital Stay (HOSPITAL_COMMUNITY): Payer: Medicare Other

## 2017-03-13 ENCOUNTER — Encounter: Payer: Medicare Other | Admitting: Physical Medicine & Rehabilitation

## 2017-03-13 DIAGNOSIS — O208 Other hemorrhage in early pregnancy: Secondary | ICD-10-CM | POA: Insufficient documentation

## 2017-03-13 DIAGNOSIS — Z3A01 Less than 8 weeks gestation of pregnancy: Secondary | ICD-10-CM | POA: Diagnosis not present

## 2017-03-13 DIAGNOSIS — Z3491 Encounter for supervision of normal pregnancy, unspecified, first trimester: Secondary | ICD-10-CM

## 2017-03-13 DIAGNOSIS — O209 Hemorrhage in early pregnancy, unspecified: Secondary | ICD-10-CM

## 2017-03-13 LAB — URINALYSIS, ROUTINE W REFLEX MICROSCOPIC
Bilirubin Urine: NEGATIVE
Glucose, UA: NEGATIVE mg/dL
Ketones, ur: NEGATIVE mg/dL
Leukocytes, UA: NEGATIVE
Nitrite: NEGATIVE
PROTEIN: NEGATIVE mg/dL
SPECIFIC GRAVITY, URINE: 1.012 (ref 1.005–1.030)
pH: 6 (ref 5.0–8.0)

## 2017-03-13 NOTE — MAU Provider Note (Signed)
Chief Complaint: Vaginal Bleeding   None     SUBJECTIVE HPI: Alexandria Boyle is a 41 y.o. G2P1001 at [redacted]w[redacted]d by 6 week [place] who presents to maternity admissions reporting onset of bright red vaginal bleeding today. It is associated with pelvic pressure.  She has not tried any treatments, she came to MAU 30 minutes after noticing the red bleeding. It is light, only when wiping, and has not required a pad.  There are no other associated symptoms.  She had [place] on 7/27 showing IUP with gestational sac and yolk sac present. She has generalized pain and back pain that is chronic and not associated with today's bleeding. She also reports pregnancy nausea x 2 weeks without vomiting. She denies vaginal itching/burning, urinary symptoms, h/a, dizziness, n/v, or fever/chills.     HPI  Past Medical History:  Diagnosis Date  . Allergy   . Arthritis   . Back disorder   . Complex regional pain syndrome   . CRPS (complex regional pain syndrome type I)   . CRPS (complex regional pain syndrome type I)   . Depression   . Fibromyalgia   . Paresthesia 07/13/2015  . Syncope and collapse 07/13/2015   Past Surgical History:  Procedure Laterality Date  . CESAREAN SECTION     Social History   Social History  . Marital status: Single    Spouse name: N/A  . Number of children: 1  . Years of education: 15   Occupational History  . photographer    Social History Main Topics  . Smoking status: Never Smoker  . Smokeless tobacco: Never Used  . Alcohol use No  . Drug use: No  . Sexual activity: Yes    Birth control/ protection: None   Other Topics Concern  . Not on file   Social History Narrative   Patient drinks about 1-2 cups of caffeine daily.   Patient is right handed.    No current facility-administered medications on file prior to encounter.    Current Outpatient Prescriptions on File Prior to Encounter  Medication Sig Dispense Refill  . Ascorbic Acid (VITAMIN C PO) Take 1 tablet by mouth  daily.    Marland Kitchen aspirin 81 MG tablet Take 1 tablet (81 mg total) by mouth daily. 90 tablet 4  . cetirizine HCl (ZYRTEC) 5 MG/5ML SOLN Take 10 mLs (10 mg total) by mouth daily. 300 mL 3  . ferrous sulfate 325 (65 FE) MG tablet Take 325 mg by mouth daily with breakfast.     Allergies  Allergen Reactions  . Bee Pollen   . Citrus   . Lyrica [Pregabalin]   . Neurontin [Gabapentin]   . Strawberry Extract   . Tomato     ROS:  Review of Systems  Constitutional: Negative for chills, fatigue and fever.  Respiratory: Negative for shortness of breath.   Cardiovascular: Negative for chest pain.  Gastrointestinal: Negative for nausea and vomiting.  Genitourinary: Positive for pelvic pain and vaginal bleeding. Negative for difficulty urinating, dysuria, flank pain, vaginal discharge and vaginal pain.  Neurological: Negative for dizziness and headaches.  Psychiatric/Behavioral: Negative.      I have reviewed patient's Past Medical Hx, Surgical Hx, Family Hx, Social Hx, medications and allergies.   Physical Exam   Patient Vitals for the past 24 hrs:  BP Temp Temp src Pulse Resp SpO2 Height Weight  03/13/17 2329 128/74 - - - - - - -  03/13/17 2033 126/61 98.8 F (37.1 C) Oral 85 18 99 %  5' (1.524 m) 172 lb 4 oz (78.1 kg)   Constitutional: Well-developed, well-nourished female in no acute distress.  Cardiovascular: normal rate Respiratory: normal effort GI: Abd soft, non-tender. Pos BS x 4 MS: Extremities nontender, no edema, normal ROM Neurologic: Alert and oriented x 4.  GU: Neg CVAT.  PELVIC EXAM: Cervix pink, visually closed, without lesion, small amount dark red bleeding, vaginal walls and external genitalia normal   LAB RESULTS Results for orders placed or performed during the hospital encounter of 03/13/17 (from the past 24 hour(s))  Urinalysis, Routine w reflex microscopic     Status: Abnormal   Collection Time: 03/13/17  8:36 PM  Result Value Ref Range   Color, Urine YELLOW  YELLOW   APPearance CLEAR CLEAR   Specific Gravity, Urine 1.012 1.005 - 1.030   pH 6.0 5.0 - 8.0   Glucose, UA NEGATIVE NEGATIVE mg/dL   Hgb urine dipstick LARGE (A) NEGATIVE   Bilirubin Urine NEGATIVE NEGATIVE   Ketones, ur NEGATIVE NEGATIVE mg/dL   Protein, ur NEGATIVE NEGATIVE mg/dL   Nitrite NEGATIVE NEGATIVE   Leukocytes, UA NEGATIVE NEGATIVE   RBC / HPF TOO NUMEROUS TO COUNT 0 - 5 RBC/hpf   WBC, UA 0-5 0 - 5 WBC/hpf   Bacteria, UA RARE (A) NONE SEEN   Squamous Epithelial / LPF 0-5 (A) NONE SEEN    --/--/A POS (07/27 1756)  IMAGING US Ob Comp Less 14 Wks  Result Date: 03/13/2017 CLINICAL DATA:  Initial evaluation for vaginal bleeding. Follow-up exam from 02/28/2017. EXAM: OBSTETRIC <14 WK [place] AND TRANSVAGINAL OB US TECHNIQUE: Both transabdominal and transvaginal ultrasound examinations were performed for complete evaluation of the gestation as well as the maternal uterus, adnexal regions, and pelvic cul-de-sac. Transvaginal technique was performed to assess early pregnancy. COMPARISON:  Prior ultrasound from 02/28/2017. FINDINGS: Intrauterine gestational sac: Single Yolk sac:  Present Embryo:  Present Cardiac Activity: Present Heart Rate: 170  bpm CRL:  13.1  mm   7 w   4 d                  [place] EDC: 10/26/2017 Subchorionic hemorrhage:  None visualized. Maternal uterus/adnexae: Previously seen uterine fibroid not evaluated on this exam. Ovaries are normal bilaterally. No adnexal mass. No free fluid. IMPRESSION: 1. Single viable intrauterine pregnancy as above, estimated gestational age [redacted] weeks and 4 days by sonography. No complication. 2. No other acute maternal or uterine abnormality identified. Electronically Signed   By: Jeannine Boga M.D.   On: 03/13/2017 22:39   US Ob Comp Less 14 Wks  Result Date: 02/28/2017 CLINICAL DATA:  Vaginal bleeding for the past 2 days. Five weeks and 6 days pregnant by last menstrual period. EXAM: OBSTETRIC <14 WK [place] AND TRANSVAGINAL OB US TECHNIQUE:  Both transabdominal and transvaginal ultrasound examinations were performed for complete evaluation of the gestation as well as the maternal uterus, adnexal regions, and pelvic cul-de-sac. Transvaginal technique was performed to assess early pregnancy. COMPARISON:  None. FINDINGS: Intrauterine gestational sac: Visualized, irregular in shape. Yolk sac:  Visualized Embryo:  Not visualized MSD: 13.8  mm   6 w   1  d Subchorionic hemorrhage:  Small Maternal uterus/adnexae: Right ovarian corpus luteum cyst. Normal appearing left ovary. Multiple rounded uterine masses. The largest measures 4.6 x 4.4 x 2.4 cm. Trace free peritoneal fluid. IMPRESSION: 1. Intrauterine gestational sac and yolk sac with an estimated gestational age of [redacted] weeks and 1 day. 2. No fetal pole seen at this time.  This does not exclude a normal intrauterine gestation. Recommend follow-up quantitative B-HCG levels and follow-up US in 14 days to assess viability. This recommendation follows SRU consensus guidelines: Diagnostic Criteria for Nonviable Pregnancy Early in the First Trimester. [employer] Med 2013; 160:7371-06. 3. The gestational sac is irregular. This could be due to fetal demise or deformity of the sac by uterine fibroids. 4. Small subchorionic hemorrhage. 5. Multiple uterine fibroids, the largest measuring 4.6 cm. Electronically Signed   By: Claudie Revering M.D.   On: 02/28/2017 19:21   US Ob Transvaginal  Result Date: 03/13/2017 CLINICAL DATA:  Initial evaluation for vaginal bleeding. Follow-up exam from 02/28/2017. EXAM: OBSTETRIC <14 WK [place] AND TRANSVAGINAL OB US TECHNIQUE: Both transabdominal and transvaginal ultrasound examinations were performed for complete evaluation of the gestation as well as the maternal uterus, adnexal regions, and pelvic cul-de-sac. Transvaginal technique was performed to assess early pregnancy. COMPARISON:  Prior ultrasound from 02/28/2017. FINDINGS: Intrauterine gestational sac: Single Yolk sac:  Present Embryo:   Present Cardiac Activity: Present Heart Rate: 170  bpm CRL:  13.1  mm   7 w   4 d                  [place] EDC: 10/26/2017 Subchorionic hemorrhage:  None visualized. Maternal uterus/adnexae: Previously seen uterine fibroid not evaluated on this exam. Ovaries are normal bilaterally. No adnexal mass. No free fluid. IMPRESSION: 1. Single viable intrauterine pregnancy as above, estimated gestational age [redacted] weeks and 4 days by sonography. No complication. 2. No other acute maternal or uterine abnormality identified. Electronically Signed   By: Jeannine Boga M.D.   On: 03/13/2017 22:39   US Ob Transvaginal  Result Date: 02/28/2017 CLINICAL DATA:  Vaginal bleeding for the past 2 days. Five weeks and 6 days pregnant by last menstrual period. EXAM: OBSTETRIC <14 WK [place] AND TRANSVAGINAL OB US TECHNIQUE: Both transabdominal and transvaginal ultrasound examinations were performed for complete evaluation of the gestation as well as the maternal uterus, adnexal regions, and pelvic cul-de-sac. Transvaginal technique was performed to assess early pregnancy. COMPARISON:  None. FINDINGS: Intrauterine gestational sac: Visualized, irregular in shape. Yolk sac:  Visualized Embryo:  Not visualized MSD: 13.8  mm   6 w   1  d Subchorionic hemorrhage:  Small Maternal uterus/adnexae: Right ovarian corpus luteum cyst. Normal appearing left ovary. Multiple rounded uterine masses. The largest measures 4.6 x 4.4 x 2.4 cm. Trace free peritoneal fluid. IMPRESSION: 1. Intrauterine gestational sac and yolk sac with an estimated gestational age of [redacted] weeks and 1 day. 2. No fetal pole seen at this time. This does not exclude a normal intrauterine gestation. Recommend follow-up quantitative B-HCG levels and follow-up US in 14 days to assess viability. This recommendation follows SRU consensus guidelines: Diagnostic Criteria for Nonviable Pregnancy Early in the First Trimester. [employer] Med 2013; 269:4854-62. 3. The gestational sac is irregular.  This could be due to fetal demise or deformity of the sac by uterine fibroids. 4. Small subchorionic hemorrhage. 5. Multiple uterine fibroids, the largest measuring 4.6 cm. Electronically Signed   By: Claudie Revering M.D.   On: 02/28/2017 19:21    MAU Management/MDM: Ordered labs and [place] and reviewed results. Pt has appointment with [facility] but has not been seen yet.  [place] today finds no reason for bleeding, normal IUP noted with growth since previous US.  Bleeding precautions reviewed.  D/C home, pt to f/u with prenatal care as planned.  Rx for  Reglan to treat nausea.  Pt stable at time of discharge.  ASSESSMENT 1. Normal IUP (intrauterine pregnancy) on prenatal ultrasound, first trimester   2. Vaginal bleeding in pregnancy, first trimester     PLAN Discharge home Allergies as of 03/13/2017      Reactions   Bee Pollen    Citrus    Lyrica [pregabalin]    Neurontin [gabapentin]    Strawberry Extract    Tomato       Medication List    TAKE these medications   aspirin 81 MG tablet Take 1 tablet (81 mg total) by mouth daily.   cetirizine HCl 5 MG/5ML Soln Commonly known as:  Zyrtec Take 10 mLs (10 mg total) by mouth daily.   ferrous sulfate 325 (65 FE) MG tablet Take 325 mg by mouth daily with breakfast.   metoCLOPramide 10 MG tablet Commonly known as:  REGLAN Take 1 tablet (10 mg total) by mouth 3 (three) times daily before meals.   VITAMIN C PO Take 1 tablet by mouth daily.      Follow-up Information    Bobbye Charleston, MD Follow up.   Specialty:  Obstetrics and Gynecology Why:  As scheduled, return to MAU as needed  Contact information: [place] [id number] [phone]           Laiah Pouncey [place] Certified Nurse-Midwife 03/14/2017  1:46 AM

## 2017-03-13 NOTE — Telephone Encounter (Signed)
Pt is needing to let dr Mitchel Honour that she is now pregnant and is not able to go to pain management referral   Best number [phone]

## 2017-03-13 NOTE — Telephone Encounter (Signed)
Please advise 

## 2017-03-13 NOTE — MAU Note (Signed)
Pt states she was seen in MAU for vaginal bleeding on 7/27. Pt states the bleeding had gone away after that visit. Pt states she started having vaginal bleeding today that is more red than the previous time. Pt c/o fullness at her pubic bone. Pt states it feels like something moving around in her stomach. Pt states she thinks it may be from her fibroids.

## 2017-03-13 NOTE — Telephone Encounter (Signed)
I'm not her PCP and I'm not involved with her pain management care.

## 2017-03-14 MED ORDER — METOCLOPRAMIDE HCL 10 MG PO TABS: 10.0000 mg | ORAL_TABLET | Freq: Three times a day (TID) | ORAL | 2 refills | Status: DC

## 2017-03-28 ENCOUNTER — Inpatient Hospital Stay (HOSPITAL_COMMUNITY)
Admission: AD | Admit: 2017-03-28 | Discharge: 2017-03-28 | Disposition: A | Discharge status: Home / Self Care | Payer: Medicare Other | Source: Ambulatory Visit | Attending: Obstetrics & Gynecology | Admitting: Obstetrics & Gynecology

## 2017-03-28 ENCOUNTER — Inpatient Hospital Stay (HOSPITAL_COMMUNITY): Payer: Medicare Other

## 2017-03-28 ENCOUNTER — Encounter (HOSPITAL_COMMUNITY): Payer: Self-pay

## 2017-03-28 DIAGNOSIS — M797 Fibromyalgia: Secondary | ICD-10-CM | POA: Insufficient documentation

## 2017-03-28 DIAGNOSIS — Z7982 Long term (current) use of aspirin: Secondary | ICD-10-CM | POA: Insufficient documentation

## 2017-03-28 DIAGNOSIS — O99341 Other mental disorders complicating pregnancy, first trimester: Secondary | ICD-10-CM | POA: Diagnosis not present

## 2017-03-28 DIAGNOSIS — Z3A09 9 weeks gestation of pregnancy: Secondary | ICD-10-CM | POA: Insufficient documentation

## 2017-03-28 DIAGNOSIS — G9050 Complex regional pain syndrome I, unspecified: Secondary | ICD-10-CM | POA: Insufficient documentation

## 2017-03-28 DIAGNOSIS — O99611 Diseases of the digestive system complicating pregnancy, first trimester: Secondary | ICD-10-CM | POA: Diagnosis not present

## 2017-03-28 DIAGNOSIS — O209 Hemorrhage in early pregnancy, unspecified: Secondary | ICD-10-CM | POA: Diagnosis not present

## 2017-03-28 DIAGNOSIS — D259 Leiomyoma of uterus, unspecified: Secondary | ICD-10-CM | POA: Insufficient documentation

## 2017-03-28 DIAGNOSIS — R51 Headache: Secondary | ICD-10-CM | POA: Diagnosis present

## 2017-03-28 DIAGNOSIS — O26891 Other specified pregnancy related conditions, first trimester: Secondary | ICD-10-CM | POA: Insufficient documentation

## 2017-03-28 DIAGNOSIS — O3411 Maternal care for benign tumor of corpus uteri, first trimester: Secondary | ICD-10-CM | POA: Diagnosis not present

## 2017-03-28 DIAGNOSIS — K219 Gastro-esophageal reflux disease without esophagitis: Secondary | ICD-10-CM | POA: Insufficient documentation

## 2017-03-28 DIAGNOSIS — F329 Major depressive disorder, single episode, unspecified: Secondary | ICD-10-CM | POA: Insufficient documentation

## 2017-03-28 LAB — URINALYSIS, ROUTINE W REFLEX MICROSCOPIC
BACTERIA UA: NONE SEEN
Bilirubin Urine: NEGATIVE
GLUCOSE, UA: NEGATIVE mg/dL
KETONES UR: NEGATIVE mg/dL
Leukocytes, UA: NEGATIVE
Nitrite: NEGATIVE
PROTEIN: NEGATIVE mg/dL
Specific Gravity, Urine: 1.03 (ref 1.005–1.030)
pH: 6 (ref 5.0–8.0)

## 2017-03-28 MED ORDER — ACETAMINOPHEN 500 MG PO TABS
1000.0000 mg | ORAL_TABLET | Freq: Once | ORAL | Status: DC
  Filled 2017-03-28: qty 2

## 2017-03-28 NOTE — MAU Note (Addendum)
Pt states she was chewing gum and started having sudden sharp pain and swelling on right side of face. States she has a lot of right ear pain. Hurts more with the cold. Has a lot of nausea and acid reflux. States she had some dark brown vomit yesterday. Has some vaginal bleeding and cramping that started a few days ago-stopped and started back today. States she has fibroids. States she took children's tylenol.

## 2017-03-28 NOTE — MAU Provider Note (Signed)
History     CSN: [id number]  Arrival date and time: 03/28/17 3710   First Provider Initiated Contact with Patient 03/28/17 0112      Chief Complaint  Patient presents with  . Vaginal Bleeding  . Facial Pain  . Emesis   HPI  [name] is a 41 yo G2P1001 at 9.[redacted] wks gestation presenting to MAU with complaints of sharp pain and swelling on RT side of the face after chewing gum, RT ear pain -- hurts more with cold air, nausea and reflux.  She reports dark brown vomit yesterday.  Some VB and cramping that started a few days ago; it stopped but started back today.  She has uterine fibroids.  Past Medical History:  Diagnosis Date  . Allergy   . Arthritis   . Back disorder   . Complex regional pain syndrome   . CRPS (complex regional pain syndrome type I)   . CRPS (complex regional pain syndrome type I)   . Depression   . Fibromyalgia   . Paresthesia 07/13/2015  . Syncope and collapse 07/13/2015    Past Surgical History:  Procedure Laterality Date  . CESAREAN SECTION      Family History  Problem Relation Age of Onset  . Diabetes Mother   . Cancer Father   . Cancer Maternal Grandfather   . Diabetes Maternal Grandfather     Social History  Substance Use Topics  . Smoking status: Never Smoker  . Smokeless tobacco: Never Used  . Alcohol use No    Allergies:  Allergies  Allergen Reactions  . Bee Pollen   . Citrus   . Lyrica [Pregabalin]   . Neurontin [Gabapentin]   . Strawberry Extract   . Tomato     Prescriptions Prior to Admission  Medication Sig Dispense Refill Last Dose  . Ascorbic Acid (VITAMIN C PO) Take 1 tablet by mouth daily.   03/13/2017 at Unknown time  . aspirin 81 MG tablet Take 1 tablet (81 mg total) by mouth daily. 90 tablet 4 03/13/2017 at Unknown time  . cetirizine HCl (ZYRTEC) 5 MG/5ML SOLN Take 10 mLs (10 mg total) by mouth daily. 300 mL 3 03/13/2017 at Unknown time  . ferrous sulfate 325 (65 FE) MG tablet Take 325 mg by mouth daily with  breakfast.   03/12/2017 at Unknown time  . metoCLOPramide (REGLAN) 10 MG tablet Take 1 tablet (10 mg total) by mouth 3 (three) times daily before meals. 60 tablet 2     Review of Systems  Constitutional: Negative.   HENT: Positive for dental problem.   Eyes: Negative.   Respiratory: Negative.   Gastrointestinal: Positive for abdominal pain, nausea and vomiting.  Endocrine: Positive for cold intolerance (cold makes her ear pain more).  Genitourinary: Positive for vaginal bleeding.  Musculoskeletal: Negative.   Skin: Negative.   Allergic/Immunologic: Negative.   Neurological: Negative.   Hematological: Negative.   Psychiatric/Behavioral: Negative.    Physical Exam   Blood pressure 124/70, pulse 79, temperature 99.2 F (37.3 C), temperature source Oral, resp. rate 19, height 5' (1.524 m), weight 78.5 kg (173 lb), last menstrual period 01/18/2017, SpO2 100 %.  Physical Exam  Constitutional: She is oriented to person, place, and time. She appears well-developed and well-nourished.  HENT:  Head: Normocephalic.  Eyes: Pupils are equal, round, and reactive to light.  Neck: Normal range of motion.  Cardiovascular: Normal rate, regular rhythm and normal heart sounds.   Respiratory: Effort normal and breath sounds normal.  GI: Soft. Bowel sounds are normal.  Genitourinary:  Genitourinary Comments: Uterus: gravid, mildly tender over bladder, S=D, cx; smooth, pink, no lesions, small amt of thick, mucoid blood in cervical os, closed/long/firm, no CMT or friability, no adnexal tenderness  Musculoskeletal: Normal range of motion.  Neurological: She is alert and oriented to person, place, and time. She has normal reflexes.  Skin: Skin is warm and dry.  Psychiatric: She has a normal mood and affect. Her behavior is normal. Judgment and thought content normal.    MAU Course  Procedures  MDM CCUA Tylenol 1000 mg PO -- pt spit out in an emesis bag OB Limited US < 14 wks  Results for orders  placed or performed during the hospital encounter of 03/28/17 (from the past 24 hour(s))  Urinalysis, Routine w reflex microscopic     Status: Abnormal   Collection Time: 03/28/17 12:27 AM  Result Value Ref Range   Color, Urine YELLOW YELLOW   APPearance CLEAR CLEAR   Specific Gravity, Urine 1.030 1.005 - 1.030   pH 6.0 5.0 - 8.0   Glucose, UA NEGATIVE NEGATIVE mg/dL   Hgb urine dipstick MODERATE (A) NEGATIVE   Bilirubin Urine NEGATIVE NEGATIVE   Ketones, ur NEGATIVE NEGATIVE mg/dL   Protein, ur NEGATIVE NEGATIVE mg/dL   Nitrite NEGATIVE NEGATIVE   Leukocytes, UA NEGATIVE NEGATIVE   RBC / HPF 6-30 0 - 5 RBC/hpf   WBC, UA 0-5 0 - 5 WBC/hpf   Bacteria, UA NONE SEEN NONE SEEN   Squamous Epithelial / LPF 0-5 (A) NONE SEEN   Mucus PRESENT    OB US < 14 wks  Normal U/S @ 9.[redacted] wks gestation, SIUP (as seen on 7/27) HR = 176 bpm, myoma posterior = 3.9 x 2.7 x 3.5 cm, no FF  Assessment and Plan  Vaginal bleeding in pregnancy, first trimester  - Reassurance given of normal U/S - Advised to call OB provider of choice and make an appt  Discharge home Patient verbalized an understanding of the plan of care and agrees.   Laury Deep, MSN, CNM 03/28/2017, 1:13 AM

## 2017-03-28 NOTE — Discharge Instructions (Signed)

## 2017-03-29 LAB — URINE CULTURE: SPECIAL REQUESTS: NORMAL

## 2017-04-22 ENCOUNTER — Other Ambulatory Visit: Payer: Self-pay | Admitting: Obstetrics and Gynecology

## 2017-04-23 ENCOUNTER — Other Ambulatory Visit: Payer: Self-pay | Admitting: Obstetrics and Gynecology

## 2017-04-23 DIAGNOSIS — I10 Essential (primary) hypertension: Secondary | ICD-10-CM | POA: Diagnosis not present

## 2017-04-24 ENCOUNTER — Inpatient Hospital Stay (HOSPITAL_COMMUNITY)
Admission: AD | Admit: 2017-04-24 | Discharge: 2017-04-24 | Disposition: A | Discharge status: Home / Self Care | Payer: Medicare Other | Source: Ambulatory Visit | Attending: Obstetrics and Gynecology | Admitting: Obstetrics and Gynecology

## 2017-04-24 ENCOUNTER — Encounter (HOSPITAL_COMMUNITY): Payer: Self-pay | Admitting: Obstetrics and Gynecology

## 2017-04-24 ENCOUNTER — Inpatient Hospital Stay (HOSPITAL_COMMUNITY): Payer: Medicare Other

## 2017-04-24 DIAGNOSIS — O209 Hemorrhage in early pregnancy, unspecified: Secondary | ICD-10-CM

## 2017-04-24 DIAGNOSIS — D259 Leiomyoma of uterus, unspecified: Secondary | ICD-10-CM | POA: Diagnosis present

## 2017-04-24 DIAGNOSIS — O039 Complete or unspecified spontaneous abortion without complication: Secondary | ICD-10-CM | POA: Diagnosis present

## 2017-04-24 DIAGNOSIS — O4690 Antepartum hemorrhage, unspecified, unspecified trimester: Secondary | ICD-10-CM

## 2017-04-24 DIAGNOSIS — D509 Iron deficiency anemia, unspecified: Secondary | ICD-10-CM | POA: Diagnosis present

## 2017-04-24 DIAGNOSIS — O26892 Other specified pregnancy related conditions, second trimester: Secondary | ICD-10-CM | POA: Diagnosis present

## 2017-04-24 DIAGNOSIS — R102 Pelvic and perineal pain: Secondary | ICD-10-CM | POA: Diagnosis present

## 2017-04-24 LAB — CBC
HCT: 21.6 % — ABNORMAL LOW (ref 36.0–46.0)
HEMOGLOBIN: 7 g/dL — AB (ref 12.0–15.0)
MCH: 24.5 pg — ABNORMAL LOW (ref 26.0–34.0)
MCHC: 32.4 g/dL (ref 30.0–36.0)
MCV: 75.5 fL — ABNORMAL LOW (ref 78.0–100.0)
PLATELETS: 245 10*3/uL (ref 150–400)
RBC: 2.86 MIL/uL — AB (ref 3.87–5.11)
RDW: 18.2 % — ABNORMAL HIGH (ref 11.5–15.5)
WBC: 11.6 10*3/uL — AB (ref 4.0–10.5)

## 2017-04-24 MED ORDER — OXYCODONE-ACETAMINOPHEN 5-325 MG PO TABS: 1.0000 | ORAL_TABLET | Freq: Four times a day (QID) | ORAL | 0 refills | Status: DC | PRN

## 2017-04-24 MED ORDER — OXYCODONE-ACETAMINOPHEN 5-325 MG PO TABS
2.0000 | ORAL_TABLET | Freq: Once | ORAL | Status: AC
  Administered 2017-04-24: 2 via ORAL
  Filled 2017-04-24: qty 2

## 2017-04-24 MED ORDER — MISOPROSTOL 200 MCG PO TABS
800.0000 ug | ORAL_TABLET | Freq: Once | ORAL | Status: AC
  Administered 2017-04-24: 800 ug via RECTAL
  Filled 2017-04-24: qty 4

## 2017-04-24 MED ORDER — FERROUS SULFATE 325 (65 FE) MG PO TABS: 325.0000 mg | ORAL_TABLET | Freq: Three times a day (TID) | ORAL | 3 refills | Status: DC

## 2017-04-24 NOTE — MAU Note (Signed)
Discharge instructions reviewed with patient, pt. stable, ready for discharge.

## 2017-04-24 NOTE — MAU Note (Signed)
Pt. started bleeding around 0300 this morning. "It just dropped out about an hour ago, clots and the baby." Pain 12/10, uterine cramps, "it feels like somebody is reaching up into my vagina and pulling something out."

## 2017-04-24 NOTE — MAU Provider Note (Signed)
History     CSN: [id number]  Arrival date and time: 04/24/17 1101   First Provider Initiated Contact with Patient 04/24/17 1146      Chief Complaint  Patient presents with  . Vaginal Bleeding   HPI  Alexandria Boyle is a 41 y.o. G2P1001 [redacted]w[redacted]d gestation presenting with complaints of severe pelvic pain and heavy VB with clots since 0300.  She states she passed clots and "the baby" about 1 hour ago.  She states "it feels like somebody is reaching up in my vagina and pulling something out".  She rates her pain a "12/10".   Past Medical History:  Diagnosis Date  . Allergy   . Arthritis   . Back disorder   . Complex regional pain syndrome   . CRPS (complex regional pain syndrome type I)   . CRPS (complex regional pain syndrome type I)   . Depression   . Fibromyalgia   . Paresthesia 07/13/2015  . Syncope and collapse 07/13/2015    Past Surgical History:  Procedure Laterality Date  . CESAREAN SECTION      Family History  Problem Relation Age of Onset  . Diabetes Mother   . Cancer Father   . Cancer Maternal Grandfather   . Diabetes Maternal Grandfather     Social History  Substance Use Topics  . Smoking status: Never Smoker  . Smokeless tobacco: Never Used  . Alcohol use No    Allergies:  Allergies  Allergen Reactions  . Bee Pollen   . Citrus   . Lyrica [Pregabalin]   . Neurontin [Gabapentin]   . Strawberry Extract   . Tomato     Prescriptions Prior to Admission  Medication Sig Dispense Refill Last Dose  . Ascorbic Acid (VITAMIN C PO) Take 1 tablet by mouth daily.   03/13/2017 at Unknown time  . aspirin 81 MG tablet Take 1 tablet (81 mg total) by mouth daily. 90 tablet 4 03/13/2017 at Unknown time  . cetirizine HCl (ZYRTEC) 5 MG/5ML SOLN Take 10 mLs (10 mg total) by mouth daily. 300 mL 3 03/13/2017 at Unknown time  . ferrous sulfate 325 (65 FE) MG tablet Take 325 mg by mouth daily with breakfast.   03/12/2017 at Unknown time  . metoCLOPramide (REGLAN) 10 MG  tablet Take 1 tablet (10 mg total) by mouth 3 (three) times daily before meals. 60 tablet 2     Review of Systems  Constitutional: Negative.   HENT: Negative.   Gastrointestinal: Positive for abdominal pain and rectal pain (pressure with pain).  Endocrine: Negative.   Genitourinary: Positive for pelvic pain and vaginal bleeding.  Musculoskeletal: Negative.   Skin: Negative.   Allergic/Immunologic: Negative.   Neurological: Negative.   Hematological: Negative.   Psychiatric/Behavioral: The patient is nervous/anxious.    Physical Exam   Blood pressure (!) 142/75, pulse 94, temperature 99.3 F (37.4 C), temperature source Oral, resp. rate (!) 22, last menstrual period 01/18/2017, SpO2 100 %.  Physical Exam  Nursing note and vitals reviewed. Constitutional: She is oriented to person, place, and time. She appears well-developed and well-nourished.  HENT:  Head: Normocephalic.  Eyes: Pupils are equal, round, and reactive to light.  Neck: Normal range of motion.  Cardiovascular: Normal rate, regular rhythm and normal heart sounds.   Respiratory: Effort normal and breath sounds normal.  GI: Soft. Bowel sounds are normal.  Musculoskeletal: Normal range of motion.  Neurological: She is alert and oriented to person, place, and time.  Skin: Skin is  warm and dry.  Psychiatric: She has a normal mood and affect. Her behavior is normal. Judgment and thought content normal.    MAU Course  Procedures  MDM CBC OB U/S < 14 wks OB Transvaginal U/S Percocet 5/325 mg x 2 tablets -- improved pain Heartstrings info  *Consult with Dr. Murrell Redden @ 1306 - notified of patient's complaints, assessments, lab & U/S results, sent specimen to pathology - TC @ 1330 with orders to give Cytotec 800 mcg rectally, monitor bleeding, have pt increase oral iron supplement samples given yesterday to TID instead of BID, and [facility] of[facility] will call her to schedule F/U with Dr. Ronita Hipps next week  Results for orders  placed or performed during the hospital encounter of 04/24/17 (from the past 24 hour(s))  CBC     Status: Abnormal   Collection Time: 04/24/17 12:24 PM  Result Value Ref Range   WBC 11.6 (H) 4.0 - 10.5 K/uL   RBC 2.86 (L) 3.87 - 5.11 MIL/uL   Hemoglobin 7.0 (L) 12.0 - 15.0 g/dL   HCT 21.6 (L) 36.0 - 46.0 %   MCV 75.5 (L) 78.0 - 100.0 fL   MCH 24.5 (L) 26.0 - 34.0 pg   MCHC 32.4 30.0 - 36.0 g/dL   RDW 18.2 (H) 11.5 - 15.5 %   Platelets 245 150 - 400 K/uL   OB U/S < 14 wks Result Date: 04/24/2017 CLINICAL DATA:  Heavy vaginal bleeding. EXAM: OBSTETRIC <14 WK ULTRASOUND TECHNIQUE: Transabdominal ultrasound was performed for evaluation of the gestation as well as the maternal uterus and adnexal regions. COMPARISON:  03/28/2017. FINDINGS: Intrauterine gestational sac: None Yolk sac:  Not Visualized. Embryo:  Not Visualized. Cardiac Activity: Not Visualized. Maternal uterus/adnexae: Right ovary: Normal Left ovary: Normal Other :Posterior uterine fibroid measures 4.6 x 4.8 x 4.2 cm. The endometrium appears heterogeneous and thickened measuring 35 mm. No increased vascularity. Free fluid:  None IMPRESSION: 1. No intrauterine gestational sac or embryo identified. Findings meet definitive criteria for failed pregnancy. This follows SRU consensus guidelines: Diagnostic Criteria for Nonviable Pregnancy Early in the First Trimester. Alison Stalling J Med [phone]. 2. The endometrium appears heterogeneous and thickened measuring 35 mm. Retained products of conception can be suspected on ultrasound if the endometrial thickness is >10 mm following spontaneous abortion (80% sensitive). 3. Uterine fibroid. 4. These results will be called to the ordering clinician or representative by the Radiologist Assistant, and communication documented in the PACS or Vision Dashboard. Electronically Signed   By: Kerby Moors M.D.   On: 04/24/2017 13:10    Assessment and Plan  Vaginal bleeding in pregnancy, first trimester -  Bleeding minimal prior to d/c home - Bleeding Precautions reviewed - Advised to return to [facility] office or MAU for excessive bleeding where she is having to change pads every 1 hour  - Increase oral iron supplements to TID; use the samples given to you by Dr. Murrell Redden   Miscarriage - Miscarriage information and Heartstrings given - Someone from [facility] will call to schedule a F/U appt with Dr. Ronita Hipps on next week - Rx Percocet 5/325 mg every 6 hrs prn severe pain  Discharge home Patient verbalized an understanding of the plan of care and agrees.   Laury Deep, MSN, CNM 04/24/2017, 11:59 AM
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
## 2017-04-24 NOTE — MAU Note (Signed)
Pt. crying, sharing pictures of fetus. RN consoling patient. Heartstrings information shared with patient.

## 2017-04-26 ENCOUNTER — Telehealth: Payer: Self-pay | Admitting: Obstetrics and Gynecology

## 2017-04-26 NOTE — Telephone Encounter (Signed)
Notified of chorionic villi present on surgical pathology.  Patient questions about the need for an iron transfusion. Advised to call her OB office for instructions on iron transfusion vs. Taking oral iron supplementation TID. Patient verbalized an understanding of the plan of care and agrees.  Laury Deep, CNM 04/26/2017 11:07 AM

## 2017-04-28 ENCOUNTER — Other Ambulatory Visit: Payer: Self-pay | Admitting: Obstetrics and Gynecology

## 2017-05-13 DIAGNOSIS — Z130 Encounter for screening for diseases of the blood and blood-forming organs and certain disorders involving the immune mechanism: Secondary | ICD-10-CM | POA: Diagnosis not present

## 2017-05-15 ENCOUNTER — Other Ambulatory Visit: Payer: Self-pay

## 2017-05-15 DIAGNOSIS — R202 Paresthesia of skin: Secondary | ICD-10-CM

## 2017-05-15 DIAGNOSIS — R200 Anesthesia of skin: Secondary | ICD-10-CM

## 2017-05-16 ENCOUNTER — Ambulatory Visit: Payer: Medicare Other | Admitting: Neurology

## 2017-06-03 ENCOUNTER — Ambulatory Visit (INDEPENDENT_AMBULATORY_CARE_PROVIDER_SITE_OTHER): Payer: Medicare Other | Admitting: Neurology

## 2017-06-03 DIAGNOSIS — R200 Anesthesia of skin: Secondary | ICD-10-CM | POA: Diagnosis not present

## 2017-06-03 DIAGNOSIS — R202 Paresthesia of skin: Secondary | ICD-10-CM

## 2017-06-03 NOTE — Procedures (Signed)
[facility] Neurology  [address]  [place], [place] [address] Tel: [phone] Fax:  [phone] Test Date:  06/03/2017  Patient: Alexandria Boyle DOB: [birth year] Physician: Narda Amber, DO  Sex: Female Height: 4\' 11"  Ref Phys: Metta Clines, DO  ID#: [id number] Temp: 32.2C Technician:    Patient Complaints: This is a 41 year old female referred for evaluation of generalized pain and paresthesias.  NCV & EMG Findings: Extensive electrodiagnostic testing of the right upper and lower extremity shows:  1. All sensory responses including the right median, ulnar, mixed palmer, sural, and superficial peroneal nerves are within normal limits. 2. All motor responses including the right median, ulnar, peroneal, and tibial nerves are within normal limits. 3. There is no evidence of active or chronic motor axon loss changes affecting any of the tested muscles. There is a global pattern of incomplete motor unit activation as seen by variable firing pattern, which may be due to poor effort, pain, and less likely central disorder of motor unit control.   Impression: This is a normal study.   There is no evidence of a sensorimotor polyneuropathy, diffuse myopathy, or cervical/lumbosacral radiculopathy.   ___________________________ Narda Amber, DO    Nerve Conduction Studies Anti Sensory Summary Table   Site NR Peak (ms) Norm Peak (ms) P-T Amp (V) Norm P-T Amp  Right Median Anti Sensory (2nd Digit)  32.2C  Wrist    2.4 <3.4 57.8 >20  Right Sup Peroneal Anti Sensory (Ant Lat Mall)  32.2C  12 cm    2.6 <4.5 11.3 >5  Right Sural Anti Sensory (Lat Mall)  32.2C  Calf    2.4 <4.5 36.7 >5  Right Ulnar Anti Sensory (5th Digit)  32.2C  Wrist    2.3 <3.1 44.7 >12   Motor Summary Table   Site NR Onset (ms) Norm Onset (ms) O-P Amp (mV) Norm O-P Amp Site1 Site2 Delta-0 (ms) Dist (cm) Vel (m/s) Norm Vel (m/s)  Right Median Motor (Abd Poll Brev)  32.2C  Wrist    2.3 <3.9 12.3  >6 Elbow Wrist 4.3 26.0 60 >50  Elbow    6.6  11.2         Right Peroneal Motor (Ext Dig Brev)  32.2C  Ankle    2.6 <5.5 6.7 >3 B Fib Ankle 6.4 37.0 58 >40  B Fib    9.0  6.3  Poplt B Fib 0.8 8.0 100 >40  Poplt    9.8  6.3         Right Tibial Motor (Abd Hall Brev)  32.2C  Ankle    3.6 <6.0 12.2 >8 Knee Ankle 6.1 36.0 59 >40  Knee    9.7  12.0         Right Ulnar Motor (Abd Dig Minimi)  32.2C  Wrist    2.0 <3.1 12.6 >7 B Elbow Wrist 3.5 22.0 63 >50  B Elbow    5.5  11.7  A Elbow B Elbow 1.6 10.0 63 >50  A Elbow    7.1  10.2          Comparison Summary Table   Site NR Peak (ms) Norm Peak (ms) P-T Amp (V) Site1 Site2 Delta-P (ms) Norm Delta (ms)  Right Median/Ulnar Palm Comparison (Wrist - 8cm)  32.2C  Median Palm    1.4 <2.2 37.8 Median Palm Ulnar Palm 0.0   Ulnar Palm    1.4 <2.2 21.8        EMG   Side Muscle  Ins Act Fibs Psw Fasc Number Recrt Dur Dur. Amp Amp. Poly Poly. Comment  Right 1stDorInt Nml Nml Nml Nml 1- Mod-V Nml Nml Nml Nml Nml Nml N/A  Right PronatorTeres Nml Nml Nml Nml 1- Mod-V Nml Nml Nml Nml Nml Nml N/A  Right Biceps Nml Nml Nml Nml 1- Mod-V Nml Nml Nml Nml Nml Nml N/A  Right Triceps Nml Nml Nml Nml 1- Mod-V Nml Nml Nml Nml Nml Nml N/A  Right Deltoid Nml Nml Nml Nml 1- Mod-V Nml Nml Nml Nml Nml Nml N/A  Right AntTibialis Nml Nml Nml Nml 1- Mod-V Nml Nml Nml Nml Nml Nml N/A  Right Gastroc Nml Nml Nml Nml 1- Mod-V Nml Nml Nml Nml Nml Nml N/A  Right RectFemoris Nml Nml Nml Nml 1- Mod-V Nml Nml Nml Nml Nml Nml N/A  Right GluteusMed Nml Nml Nml Nml 1- Mod-V Nml Nml Nml Nml Nml Nml N/A  Right BicepsFemS Nml Nml Nml Nml 1- Mod-V Nml Nml Nml Nml Nml Nml N/A      Waveforms:

## 2017-06-06 ENCOUNTER — Encounter: Payer: Self-pay | Admitting: Neurology

## 2017-06-06 ENCOUNTER — Telehealth: Payer: Self-pay

## 2017-06-06 ENCOUNTER — Ambulatory Visit (INDEPENDENT_AMBULATORY_CARE_PROVIDER_SITE_OTHER): Payer: Medicare Other | Admitting: Neurology

## 2017-06-06 VITALS — BP 110/64 | HR 97 | Ht 60.0 in | Wt 169.4 lb

## 2017-06-06 DIAGNOSIS — R55 Syncope and collapse: Secondary | ICD-10-CM | POA: Diagnosis not present

## 2017-06-06 DIAGNOSIS — G894 Chronic pain syndrome: Secondary | ICD-10-CM

## 2017-06-06 DIAGNOSIS — R200 Anesthesia of skin: Secondary | ICD-10-CM | POA: Diagnosis not present

## 2017-06-06 DIAGNOSIS — R202 Paresthesia of skin: Secondary | ICD-10-CM | POA: Diagnosis not present

## 2017-06-06 NOTE — Patient Instructions (Signed)
I don't think your symptoms are related to a neurologic condition.  I think you need to see a pain specialist.  Also, I would recommend seeing a cardiologist for evaluation of passing out.
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
## 2017-06-06 NOTE — Telephone Encounter (Signed)
Called pt to schedule Medicare Annual Wellness Visit. Pt declined to schedule right now due to recent miscarriage and also said OB has been taking care of wellness visits and physical visits.    Josepha Pigg, B.A.  Care Guide - [facility] [place]

## 2017-06-09 NOTE — Telephone Encounter (Signed)
Called pt to schedule Medicare Annual Wellness Visit. -nr  

## 2017-06-10 ENCOUNTER — Telehealth: Payer: Self-pay | Admitting: Neurology

## 2017-06-10 NOTE — Telephone Encounter (Signed)
Patient called and needed to let Dr. Tomi Likens know that at her last visit she forgot to let him know that she was having numbness in the Pelvic Area. Please Call. Thanks

## 2017-06-10 NOTE — Telephone Encounter (Signed)
Called Pt, advsd her again of no further recommendations

## 2017-06-10 NOTE — Telephone Encounter (Signed)
I have no further recommendations.  She should address this with her PCP

## 2017-06-10 NOTE — Telephone Encounter (Signed)
Called Pt to advise to contact PCP, Pt insists there is a nerve pinched somewhere in her back, she states an Xray is not going to find it and she should have an MRI. She states her OB and PCP have told her she needs to follow up with neurology. Pt states it is more pronounced when sitting, goes down to her feet, she feels a lump in her middle back, she also wanted you to know her lips are numb today, swollen around her cheek, though side of her face seems better. Pt was insistent I relay the message, though I told her you did not have any further recommemdations.

## 2017-06-10 NOTE — Telephone Encounter (Signed)
FYI

## 2017-06-10 NOTE — Telephone Encounter (Signed)
Again, I have no other recommendations.

## 2017-06-11 ENCOUNTER — Ambulatory Visit (INDEPENDENT_AMBULATORY_CARE_PROVIDER_SITE_OTHER): Payer: Medicare Other | Admitting: Emergency Medicine

## 2017-06-11 ENCOUNTER — Encounter: Payer: Self-pay | Admitting: Emergency Medicine

## 2017-06-11 ENCOUNTER — Other Ambulatory Visit: Payer: Self-pay

## 2017-06-11 VITALS — BP 110/64 | HR 96 | Temp 99.7°F | Resp 16 | Wt 167.6 lb

## 2017-06-11 DIAGNOSIS — G894 Chronic pain syndrome: Secondary | ICD-10-CM | POA: Diagnosis not present

## 2017-06-11 DIAGNOSIS — F329 Major depressive disorder, single episode, unspecified: Secondary | ICD-10-CM | POA: Diagnosis not present

## 2017-06-11 DIAGNOSIS — R200 Anesthesia of skin: Secondary | ICD-10-CM | POA: Diagnosis not present

## 2017-06-11 DIAGNOSIS — M2550 Pain in unspecified joint: Secondary | ICD-10-CM | POA: Diagnosis not present

## 2017-06-11 DIAGNOSIS — M791 Myalgia, unspecified site: Secondary | ICD-10-CM | POA: Diagnosis not present

## 2017-06-11 DIAGNOSIS — N898 Other specified noninflammatory disorders of vagina: Secondary | ICD-10-CM

## 2017-06-11 DIAGNOSIS — F32A Depression, unspecified: Secondary | ICD-10-CM

## 2017-06-11 MED ORDER — FLUCONAZOLE 150 MG PO TABS: 150.0000 mg | ORAL_TABLET | Freq: Once | ORAL | 0 refills | Status: AC

## 2017-06-11 MED ORDER — LIDOCAINE 5 % EX OINT: TOPICAL_OINTMENT | CUTANEOUS | 1 refills | Status: DC

## 2017-06-11 NOTE — Patient Instructions (Addendum)
   IF you received an x-ray today, you will receive an invoice from [facility]. Please contact [facility] at [phone] with questions or concerns regarding your invoice.   IF you received labwork today, you will receive an invoice from [facility]. Please contact [facility] at [phone] with questions or concerns regarding your invoice.   Our billing staff will not be able to assist you with questions regarding bills from these companies.  You will be contacted with the lab results as soon as they are available. The fastest way to get your results is to activate your My Chart account. Instructions are located on the last page of this paperwork. If you have not heard from us regarding the results in 2 weeks, please contact this office.       Chronic Pain, Adult Chronic pain is a type of pain that lasts or keeps coming back (recurs) for at least six months. You may have chronic headaches, abdominal pain, or body pain. Chronic pain may be related to an illness, such as fibromyalgia or complex regional pain syndrome. Sometimes the cause of chronic pain is not known. Chronic pain can make it hard for you to do daily activities. If not treated, chronic pain can lead to other health problems, including anxiety and depression. Treatment depends on the cause and severity of your pain. You may need to work with a pain specialist to come up with a treatment plan. The plan may include medicine, counseling, and physical therapy. Many people benefit from a combination of two or more types of treatment to control their pain. Follow these instructions at home: Lifestyle  Consider keeping a pain diary to share with your health care providers.  Consider talking with a mental health care provider (psychologist) about how to cope with chronic pain.  Consider joining a chronic pain support group.  Try to control or lower your stress levels. Talk to your health care provider about  strategies to do this. General instructions   Take over-the-counter and prescription medicines only as told by your health care provider.  Follow your treatment plan as told by your health care provider. This may include: ? Gentle, regular exercise. ? Eating a healthy diet that includes foods such as vegetables, fruits, fish, and lean meats. ? Cognitive or behavioral therapy. ? Working with a physical therapist. ? Meditation or yoga. ? Acupuncture or massage therapy. ? Aroma, color, light, or sound therapy. ? Local electrical stimulation. ? Shots (injections) of numbing or pain-relieving medicines into the spine or the area of pain.  Check your pain level as told by your health care provider. Ask your health care provider if you should use a pain scale.  Learn as much as you can about how to manage your chronic pain. Ask your health care provider if an intensive pain rehabilitation program or a chronic pain specialist would be helpful.  Keep all follow-up visits as told by your health care provider. This is important. Contact a health care provider if:  Your pain gets worse.  You have new pain.  You have trouble sleeping.  You have trouble doing your normal activities.  Your pain is not controlled with treatment.  Your have side effects from pain medicine.  You feel weak. Get help right away if:  You lose feeling or have numbness in your body.  You lose control of bowel or bladder function.  Your pain suddenly gets much worse.  You develop shaking or chills.  You develop confusion.    chest pain.  You have trouble breathing or shortness of breath.  You pass out.  You have thoughts about hurting yourself or others. This information is not intended to replace advice given to you by your health care provider. Make sure you discuss any questions you have with your health care provider. Document Released: 04/13/2002 Document Revised: 03/21/2016 Document Reviewed:  01/09/2016 Elsevier Interactive Patient Education  2017 [employer].

## 2017-06-11 NOTE — Progress Notes (Signed)
Alexandria Boyle 41 y.o.   Chief Complaint  Patient presents with  . Vaginal Itching    with white discharge x 2 days    HISTORY OF PRESENT ILLNESS: This is a 41 y.o. female complaining of vaginal itching with white discharge x 2 days; has h/o chronic pain with multiple painful areas; states she needs referral to pain management clinic although she has been referred before; recently seen by Neurologist; had multiple tests; all negative; neuro's feeling is her pain is non-organic in nature. Copy of his Impression and Plan below:  IMPRESSION: I  Paresthesias.  Most likely non-organic.  Findings inconsistence.  She also lateralizes bone conduction sensation which is suggestive of a non-organic etiology. II  Blackouts.  I do not think it is neurologic.  Semiology more consistent with syncope, not seizure.  Neurologic workup, including MRI brain and EEG, negative.  Recommend cardiology evaluation if not yet performed. III Chronic pain syndrome  PLAN: Recommend referral to pain specialist Consider referral to cardiology regarding black outs No further neurologic workup or follow up warranted.    HPI   Prior to Admission medications   Medication Sig Start Date End Date Taking? Authorizing Provider  Ascorbic Acid (VITAMIN C PO) Take 1 tablet by mouth daily.   Yes [provider]  aspirin 81 MG tablet Take 1 tablet (81 mg total) by mouth daily. 02/23/16  Yes Shawnee Knapp, MD  cetirizine HCl (ZYRTEC) 5 MG/5ML SOLN Take 10 mLs (10 mg total) by mouth daily. 02/19/17  Yes Courteney Alderete, Ines Bloomer, MD  ferrous sulfate 325 (65 FE) MG tablet Take 1 tablet (325 mg total) by mouth 3 (three) times daily with meals. 04/24/17  Yes Renato Battles, Rol[place], CNM  lidocaine (XYLOCAINE) 5 % ointment APP EXT AA UP TO QID PRN 02/19/17  Yes [provider]  metoCLOPramide (REGLAN) 10 MG tablet Take 1 tablet (10 mg total) by mouth 3 (three) times daily before meals. 03/14/17  Yes Leftwich-Kirby, Kathie Dike, CNM    acetaminophen (TYLENOL CHILDRENS) 160 MG/5ML suspension Take 320 mg by mouth every 6 (six) hours as needed for headache.    [provider]  oxyCODONE-acetaminophen (ROXICET) 5-325 MG tablet Take 1 tablet by mouth every 6 (six) hours as needed for severe pain. Patient not taking: Reported on 06/11/2017 04/24/17   Laury Deep, CNM  Prenatal MV-Min-FA-Omega-3 (PRENATAL GUMMIES/DHA & FA PO) Take 1 tablet by mouth.    [provider]    Allergies  Allergen Reactions  . Bee Pollen Swelling  . Tomato Itching and Swelling  . Citrus Rash  . Lyrica [Pregabalin] Rash  . Neurontin [Gabapentin] Swelling and Rash  . Strawberry Extract Itching and Rash    Patient Active Problem List   Diagnosis Date Noted  . Miscarriage 04/24/2017  . Uterine fibroid 04/24/2017  . Vaginal bleeding in pregnancy, first trimester 03/28/2017  . Numbness 02/19/2017  . Abnormal vaginal bleeding 02/19/2017  . Chronic pain syndrome 02/19/2017  . Vaginal discharge 02/19/2017  . Chronic back pain 09/19/2015  . Anemia, iron deficiency 09/19/2015  . Fibromyalgia 09/19/2015  . Paresthesia 07/13/2015  . Syncope and collapse 07/13/2015    Past Medical History:  Diagnosis Date  . Allergy   . Arthritis   . Back disorder   . Complex regional pain syndrome   . CRPS (complex regional pain syndrome type I)   . CRPS (complex regional pain syndrome type I)   . Depression   . Fibromyalgia   . Paresthesia 07/13/2015  .  Syncope and collapse 07/13/2015    Past Surgical History:  Procedure Laterality Date  . CESAREAN SECTION      Social History   Socioeconomic History  . Marital status: Single    Spouse name: Not on file  . Number of children: 1  . Years of education: 41  . Highest education level: Not on file  Social Needs  . Financial resource strain: Not on file  . Food insecurity - worry: Not on file  . Food insecurity - inability: Not on file  . Transportation needs - medical: Not on file   . Transportation needs - non-medical: Not on file  Occupational History  . Occupation: Geophysicist/field seismologist  Tobacco Use  . Smoking status: Never Smoker  . Smokeless tobacco: Never Used  Substance and Sexual Activity  . Alcohol use: No  . Drug use: No  . Sexual activity: Yes    Birth control/protection: None  Other Topics Concern  . Not on file  Social History Narrative   Patient drinks about 1-2 cups of caffeine daily.   Patient is right handed.     Family History  Problem Relation Age of Onset  . Diabetes Mother   . Cancer Father   . Cancer Maternal Grandfather   . Diabetes Maternal Grandfather      Review of Systems  Constitutional: Positive for malaise/fatigue. Negative for chills, fever and weight loss.  HENT: Negative.   Eyes: Negative.   Respiratory: Negative for cough and shortness of breath.   Cardiovascular: Positive for palpitations. Negative for chest pain, claudication and leg swelling.  Gastrointestinal: Negative for abdominal pain, blood in stool, diarrhea, nausea and vomiting.  Genitourinary: Negative for dysuria and hematuria.       Vaginal itching  Musculoskeletal: Positive for back pain, joint pain and myalgias.  Skin: Negative for rash.  Neurological: Positive for weakness. Negative for dizziness and headaches.  Endo/Heme/Allergies: Negative.   Psychiatric/Behavioral: Positive for depression.  All other systems reviewed and are negative.  Vitals:   06/11/17 1015  BP: 110/64  Pulse: 96  Resp: 16  Temp: 99.7 F (37.6 C)  SpO2: 99%     Physical Exam  Constitutional: She is oriented to person, place, and time. She appears well-developed and well-nourished.  HENT:  Head: Normocephalic and atraumatic.  Nose: Nose normal.  Mouth/Throat: Oropharynx is clear and moist.  Eyes: Conjunctivae and EOM are normal. Pupils are equal, round, and reactive to light.  Neck: Normal range of motion. Neck supple.  Cardiovascular: Normal rate, regular rhythm and  normal heart sounds.  Pulmonary/Chest: Effort normal and breath sounds normal.  Abdominal: Soft. Bowel sounds are normal. She exhibits no distension. There is no tenderness.  Genitourinary: There is no rash or lesion on the right labia. There is no rash or lesion on the left labia.  Genitourinary Comments: Chaperone in the room. Pelvic exam deferred.  Musculoskeletal: Normal range of motion.  Lymphadenopathy: No inguinal adenopathy noted on the right or left side.  Neurological: She is alert and oriented to person, place, and time. No sensory deficit. She exhibits normal muscle tone.  Skin: Skin is warm and dry. Capillary refill takes less than 2 seconds. No rash noted.  Psychiatric: She has a normal mood and affect. Her behavior is normal.  Vitals reviewed.    ASSESSMENT & PLAN: Alexandria Boyle was seen today for vaginal itching.  Diagnoses and all orders for this visit:  Chronic pain syndrome -     Ambulatory referral to Pain Clinic  Numbness  Vaginal irritation  Depression, unspecified depression type -     Ambulatory referral to Psychiatry  Myalgia -     Ambulatory referral to Rheumatology  Arthralgia, unspecified joint -     Ambulatory referral to Rheumatology  Other orders -     lidocaine (XYLOCAINE) 5 % ointment; APP EXT AA UP TO QID PRN -     fluconazole (DIFLUCAN) 150 MG tablet; Take 1 tablet (150 mg total) once for 1 dose by mouth.    Patient Instructions       IF you received an x-ray today, you will receive an invoice from [facility]. Please contact [facility] at [phone] with questions or concerns regarding your invoice.   IF you received labwork today, you will receive an invoice from [place]. Please contact [facility] at [phone] with questions or concerns regarding your invoice.   Our billing staff will not be able to assist you with questions regarding bills from these companies.  You will be contacted with the lab results as  soon as they are available. The fastest way to get your results is to activate your My Chart account. Instructions are located on the last page of this paperwork. If you have not heard from [place] regarding the results in 2 weeks, please contact this office.     Chronic Pain, Adult Chronic pain is a type of pain that lasts or keeps coming back (recurs) for at least six months. You may have chronic headaches, abdominal pain, or body pain. Chronic pain may be related to an illness, such as fibromyalgia or complex regional pain syndrome. Sometimes the cause of chronic pain is not known. Chronic pain can make it hard for you to do daily activities. If not treated, chronic pain can lead to other health problems, including anxiety and depression. Treatment depends on the cause and severity of your pain. You may need to work with a pain specialist to come up with a treatment plan. The plan may include medicine, counseling, and physical therapy. Many people benefit from a combination of two or more types of treatment to control their pain. Follow these instructions at home: Lifestyle  Consider keeping a pain diary to share with your health care providers.  Consider talking with a mental health care provider (psychologist) about how to cope with chronic pain.  Consider joining a chronic pain support group.  Try to control or lower your stress levels. Talk to your health care provider about strategies to do this. General instructions   Take over-the-counter and prescription medicines only as told by your health care provider.  Follow your treatment plan as told by your health care provider. This may include: ? Gentle, regular exercise. ? Eating a healthy diet that includes foods such as vegetables, fruits, fish, and lean meats. ? Cognitive or behavioral therapy. ? Working with a Community education officer. ? Meditation or yoga. ? Acupuncture or massage therapy. ? Aroma, color, light, or sound  therapy. ? Local electrical stimulation. ? Shots (injections) of numbing or pain-relieving medicines into the spine or the area of pain.  Check your pain level as told by your health care provider. Ask your health care provider if you should use a pain scale.  Learn as much as you can about how to manage your chronic pain. Ask your health care provider if an intensive pain rehabilitation program or a chronic pain specialist would be helpful.  Keep all follow-up visits as told by your health care provider. This is important.  Contact a health care provider if:  Your pain gets worse.  You have new pain.  You have trouble sleeping.  You have trouble doing your normal activities.  Your pain is not controlled with treatment.  Your have side effects from pain medicine.  You feel weak. Get help right away if:  You lose feeling or have numbness in your body.  You lose control of bowel or bladder function.  Your pain suddenly gets much worse.  You develop shaking or chills.  You develop confusion.  You develop chest pain.  You have trouble breathing or shortness of breath.  You pass out.  You have thoughts about hurting yourself or others. This information is not intended to replace advice given to you by your health care provider. Make sure you discuss any questions you have with your health care provider. Document Released: 04/13/2002 Document Revised: 03/21/2016 Document Reviewed: 01/09/2016 Elsevier Interactive Patient Education  2017 [employer] Inc[employer]     Agustina Caroli, MD [facility]

## 2017-06-12 ENCOUNTER — Telehealth: Payer: Self-pay | Admitting: Family Medicine

## 2017-06-12 NOTE — Telephone Encounter (Signed)
Copied from [place] [id number]. Topic: Quick Communication - See Telephone Encounter >> Jun 12, 2017  5:33 PM Boyd Kerbs wrote: CRM for notification. See Telephone encounter for:  Got Oxycodone prescription in September. She lost her prescription never had it filled.  But is in sever pain and wants to see if can come by and pick up another prescription.  Please call patient. She is asking if someone else pick up for her.  Or is there something for pain that the doctor can send to pharmacy ([facility] by [address] ) and she can have someone to pickup.  06/12/17.

## 2017-06-13 ENCOUNTER — Telehealth: Payer: Self-pay | Admitting: Emergency Medicine

## 2017-06-13 NOTE — Telephone Encounter (Signed)
Copied from [place] #[id number]. Topic: Quick Communication - See Telephone Encounter >> Jun 13, 2017  3:57 PM Patrice Paradise wrote: CRM for notification. See Telephone encounter for: 06/13/17.  Pt saw Dr. Mitchel Honour on 06/11/17 and stated that she told him about the severe that she in. She stated that he forgot to give her another prescription for the Oxycodone prescription that she lost in September. She never got the prescription filled.  Pt is in sever pain and wants to see if can come by and pick up another prescription.  Please call patient at [phone]

## 2017-06-16 NOTE — Telephone Encounter (Signed)
Informed patient she needs to see Dr. Carlota Raspberry.  Patient was not happy and hung up.

## 2017-06-16 NOTE — Telephone Encounter (Signed)
Patient states she spoke to you about this and it was never given.  Patient states she isn't going to a phychiatric doctor because she isn't crazy she is in pain.   Please advise.

## 2017-06-16 NOTE — Telephone Encounter (Signed)
I am not her PCP (Dr. Carlota Raspberry is) and I am not handling her chronic pain. We did not talk about pain prescriptions and I will not prescribe controlled substances for her. Thanks.

## 2017-06-17 NOTE — Telephone Encounter (Signed)
Copied from [place]. Topic: Complaint - Provider (sensitive) >> Jun 16, 2017  9:23 AM Darl Householder, RMA wrote: Date of Incident: 06/16/2017 Details of complaint: pt wants to explain situation directly to the practice administrator  How would the patient like to see it resolved? Pt refused to answer On a scale of 1-10, how was your experience? 1 extremely bad  What would it take to bring it to a 10? Pt did not answer  Please have Pracrtice Administrator return pt call

## 2017-06-17 NOTE — Telephone Encounter (Signed)
Alexandria Boyle,   I spoke to this patient yesterday after speaking to Dr. Mitchel Honour about this prescription, he stated he would not fill this for her that she would need to make an appointment to see Dr. Carlota Raspberry.   The original prescription was written by a doctor at an outside facility. Patient stated she never filled it.   I explained she could call that facility that wrote the original prescription or schedule an appointment with Dr. Carlota Raspberry as Dr. Mitchel Honour already explained to the patient.

## 2017-07-03 ENCOUNTER — Telehealth: Payer: Self-pay | Admitting: Family Medicine

## 2017-07-03 ENCOUNTER — Telehealth: Payer: Self-pay | Admitting: Emergency Medicine

## 2017-07-03 MED ORDER — LIDOCAINE 5 % EX OINT: TOPICAL_OINTMENT | CUTANEOUS | 0 refills | Status: DC

## 2017-07-03 NOTE — Telephone Encounter (Signed)
As lidocaine is not a controlled medication, I will agree to a one time refill until seen by pain management as she does have an upcoming appointment.  However, will need further pain medication refills from pain management.   Message sent to patient with above info by Mychart.

## 2017-07-03 NOTE — Telephone Encounter (Signed)
Spoke with pt.  She is adamant she wants the Lidocaine.  States she has been in pain for 9 years and doesn't understand. Advised we will send email to Dr. Carlota Raspberry, but unable to refill at this time.

## 2017-07-03 NOTE — Telephone Encounter (Signed)
Pt will need to be seen by pain management for further refills.

## 2017-07-03 NOTE — Telephone Encounter (Signed)
Called pt to inform about pain management appt on 07/18/17 at 2:40pm. Pt requesting refill of Lidocaine. Please advise. Pt can be reached at [phone].

## 2017-07-18 ENCOUNTER — Encounter: Payer: Self-pay | Admitting: Physical Medicine & Rehabilitation

## 2017-07-18 ENCOUNTER — Encounter: Payer: Medicare Other | Attending: Physical Medicine & Rehabilitation | Admitting: Physical Medicine & Rehabilitation

## 2017-07-18 VITALS — BP 148/90 | HR 83

## 2017-07-18 DIAGNOSIS — R55 Syncope and collapse: Secondary | ICD-10-CM | POA: Diagnosis not present

## 2017-07-18 DIAGNOSIS — D62 Acute posthemorrhagic anemia: Secondary | ICD-10-CM

## 2017-07-18 DIAGNOSIS — M797 Fibromyalgia: Secondary | ICD-10-CM | POA: Insufficient documentation

## 2017-07-18 DIAGNOSIS — M791 Myalgia, unspecified site: Secondary | ICD-10-CM | POA: Diagnosis not present

## 2017-07-18 DIAGNOSIS — G479 Sleep disorder, unspecified: Secondary | ICD-10-CM | POA: Insufficient documentation

## 2017-07-18 DIAGNOSIS — M5442 Lumbago with sciatica, left side: Secondary | ICD-10-CM

## 2017-07-18 DIAGNOSIS — G9050 Complex regional pain syndrome I, unspecified: Secondary | ICD-10-CM | POA: Insufficient documentation

## 2017-07-18 DIAGNOSIS — R269 Unspecified abnormalities of gait and mobility: Secondary | ICD-10-CM | POA: Diagnosis not present

## 2017-07-18 DIAGNOSIS — G8929 Other chronic pain: Secondary | ICD-10-CM

## 2017-07-18 DIAGNOSIS — M545 Low back pain: Secondary | ICD-10-CM | POA: Insufficient documentation

## 2017-07-18 DIAGNOSIS — M5441 Lumbago with sciatica, right side: Secondary | ICD-10-CM | POA: Diagnosis not present

## 2017-07-18 MED ORDER — BACLOFEN 10 MG PO TABS: 10.0000 mg | ORAL_TABLET | Freq: Three times a day (TID) | ORAL | 1 refills | Status: DC

## 2017-07-18 MED ORDER — MELOXICAM 15 MG PO TABS: 15.0000 mg | ORAL_TABLET | Freq: Every day | ORAL | 1 refills | Status: DC
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
## 2017-07-22 DIAGNOSIS — G8929 Other chronic pain: Secondary | ICD-10-CM | POA: Diagnosis not present

## 2017-07-22 DIAGNOSIS — M2550 Pain in unspecified joint: Secondary | ICD-10-CM | POA: Diagnosis not present

## 2017-07-22 DIAGNOSIS — M791 Myalgia, unspecified site: Secondary | ICD-10-CM | POA: Diagnosis not present

## 2017-08-12 ENCOUNTER — Telehealth: Payer: Self-pay

## 2017-08-12 NOTE — Telephone Encounter (Signed)
Patient returning a call from our office 

## 2017-08-15 ENCOUNTER — Encounter: Payer: Medicare Other | Admitting: Physical Medicine & Rehabilitation

## 2017-09-16 ENCOUNTER — Encounter: Payer: Self-pay | Admitting: *Deleted

## 2017-10-01 ENCOUNTER — Telehealth: Payer: Self-pay | Admitting: Physical Medicine & Rehabilitation

## 2017-10-01 NOTE — Telephone Encounter (Signed)
Patient has misplaced referral for the walker, can you please do a new one that way we can send to [place] to get this ordered?  Thanks.

## 2017-10-01 NOTE — Telephone Encounter (Signed)
We can write a new prescription.  Thank you.

## 2017-10-02 ENCOUNTER — Encounter: Payer: Medicare Other | Admitting: Physical Medicine & Rehabilitation

## 2017-10-02 NOTE — Telephone Encounter (Signed)
Order faxed to [facility]. 

## 2017-10-16 ENCOUNTER — Encounter: Payer: Medicare Other | Admitting: Obstetrics & Gynecology

## 2017-12-09 ENCOUNTER — Telehealth: Payer: Self-pay | Admitting: *Deleted

## 2017-12-09 NOTE — Telephone Encounter (Signed)
I have only seen the patient 1 time and that was 5 months ago, she cancelled follow up appointments.  The changes in physical exam findings were not present at that time and she should be evaluated, especially if she is having tongue swelling, ASAP.  We have not had an follow up on her meds for efficacy, compliance, dosing adjustments.  She was only prescribed 1 refill, so there is a good chace she has not been taking anything for months. In the event she is taking something, it is unlikely that Baclofen or Mobic would cause a yeast infection, especially 5 months later.  She should speak with her PCP.  She was also supposed to bring her records from [place], including imaging for review.  She also has not obtained lab work as ordered.  Regarding her assistive device, I wrote a prescription for a walker when I saw her, which she lost, and she requested another one, which was sent to her.

## 2017-12-09 NOTE — Telephone Encounter (Addendum)
Patient left a message asking for call back.  I called the patient back for clarification. And so it goes, She has financial constraints and cannot afford regular visits, Her body is all jacked up, she's got knots in her calf muscle (what can be done about the knots?), She has swelling in foot, leg and fingers (not sure what to do?), right side of body feels puffy, especially the the right side of her tongue, patient feels an assistive device would be helpful for outings that require extended walking (she would like a script for a cane), Her meds are not working and one of the ones that Dr. Posey Pronto has prescribed has knocked her Rodman out of whack which in turn is causing lady problems (yeast infection).  She is asking for an Rx to treat the yeast infection that is caused by the medication that Dr. Posey Pronto prescribed. And one more thing, her insurance company has not received the medical records that were requested 3 weeks ago.This I followed up  with Kathyrn Lass.  It was filled out and sent to a different fax number than the one provided by patient's insurance company.

## 2017-12-12 NOTE — Telephone Encounter (Signed)
April is scheduling patient for follow up.  In the meantime can we submit a DME order to [facility] for a walking cane.

## 2017-12-12 NOTE — Telephone Encounter (Signed)
At this point I am not sure if she needs a cane or a walker.  When I saw her last, I believe she needed a walker, which I don't know if she picked up (ordered twice).

## 2017-12-17 ENCOUNTER — Telehealth: Payer: Self-pay | Admitting: Physical Medicine & Rehabilitation

## 2017-12-17 NOTE — Telephone Encounter (Signed)
Explained to patient that Dr. Posey Pronto needed to evaluate her to get a cane.  She has an appointment at end of month with him, she is worried about getting a bill for our office.  I explained to her to check with her insurance, then call us back to get appointment moved up to get evaluated for cane.

## 2017-12-17 NOTE — Telephone Encounter (Signed)
Pt would feel better with a cane instead of a walker. Can we get an order for a cane?

## 2017-12-17 NOTE — Telephone Encounter (Signed)
I think she needs to be evaluated. I do not want to prescribe her a cane if she needs more stability and has a fall as a result of the a false sense of security with a cane.  Again, this is going by my one and only evaluation of her 5-6 months ago. Thanks.

## 2018-01-01 ENCOUNTER — Encounter: Payer: Medicare Other | Attending: Physical Medicine & Rehabilitation | Admitting: Physical Medicine & Rehabilitation

## 2018-01-01 ENCOUNTER — Encounter: Payer: Self-pay | Admitting: Physical Medicine & Rehabilitation

## 2018-01-01 ENCOUNTER — Other Ambulatory Visit: Payer: Self-pay

## 2018-01-01 VITALS — BP 116/78 | HR 84 | Ht 62.0 in | Wt 166.0 lb

## 2018-01-01 DIAGNOSIS — M5441 Lumbago with sciatica, right side: Secondary | ICD-10-CM

## 2018-01-01 DIAGNOSIS — M1990 Unspecified osteoarthritis, unspecified site: Secondary | ICD-10-CM | POA: Diagnosis not present

## 2018-01-01 DIAGNOSIS — G9050 Complex regional pain syndrome I, unspecified: Secondary | ICD-10-CM | POA: Insufficient documentation

## 2018-01-01 DIAGNOSIS — M791 Myalgia, unspecified site: Secondary | ICD-10-CM | POA: Diagnosis not present

## 2018-01-01 DIAGNOSIS — F329 Major depressive disorder, single episode, unspecified: Secondary | ICD-10-CM | POA: Diagnosis not present

## 2018-01-01 DIAGNOSIS — M545 Low back pain: Secondary | ICD-10-CM | POA: Insufficient documentation

## 2018-01-01 DIAGNOSIS — R269 Unspecified abnormalities of gait and mobility: Secondary | ICD-10-CM | POA: Insufficient documentation

## 2018-01-01 DIAGNOSIS — M25561 Pain in right knee: Secondary | ICD-10-CM | POA: Insufficient documentation

## 2018-01-01 DIAGNOSIS — D62 Acute posthemorrhagic anemia: Secondary | ICD-10-CM

## 2018-01-01 DIAGNOSIS — Z9889 Other specified postprocedural states: Secondary | ICD-10-CM | POA: Diagnosis not present

## 2018-01-01 DIAGNOSIS — R531 Weakness: Secondary | ICD-10-CM | POA: Insufficient documentation

## 2018-01-01 DIAGNOSIS — G479 Sleep disorder, unspecified: Secondary | ICD-10-CM | POA: Diagnosis not present

## 2018-01-01 DIAGNOSIS — M797 Fibromyalgia: Secondary | ICD-10-CM | POA: Diagnosis not present

## 2018-01-01 DIAGNOSIS — Z8759 Personal history of other complications of pregnancy, childbirth and the puerperium: Secondary | ICD-10-CM | POA: Diagnosis not present

## 2018-01-01 DIAGNOSIS — R55 Syncope and collapse: Secondary | ICD-10-CM | POA: Diagnosis not present

## 2018-01-01 DIAGNOSIS — M25562 Pain in left knee: Secondary | ICD-10-CM | POA: Insufficient documentation

## 2018-01-01 DIAGNOSIS — R42 Dizziness and giddiness: Secondary | ICD-10-CM | POA: Insufficient documentation

## 2018-01-01 DIAGNOSIS — Z809 Family history of malignant neoplasm, unspecified: Secondary | ICD-10-CM | POA: Insufficient documentation

## 2018-01-01 DIAGNOSIS — G629 Polyneuropathy, unspecified: Secondary | ICD-10-CM | POA: Diagnosis not present

## 2018-01-01 DIAGNOSIS — Z833 Family history of diabetes mellitus: Secondary | ICD-10-CM | POA: Diagnosis not present

## 2018-01-01 DIAGNOSIS — M5442 Lumbago with sciatica, left side: Secondary | ICD-10-CM | POA: Diagnosis not present

## 2018-01-01 DIAGNOSIS — G8929 Other chronic pain: Secondary | ICD-10-CM | POA: Diagnosis not present

## 2018-01-01 MED ORDER — METHYLPREDNISOLONE 4 MG PO TBPK: ORAL_TABLET | ORAL | 0 refills | Status: DC

## 2018-01-01 MED ORDER — LIDOCAINE 5 % EX OINT: TOPICAL_OINTMENT | CUTANEOUS | 0 refills | Status: DC
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
## 2018-01-02 ENCOUNTER — Telehealth: Payer: Self-pay | Admitting: Family Medicine

## 2018-01-02 NOTE — Telephone Encounter (Signed)
Copied from [place] [id number]. Topic: Medical Record Request - Patient ROI Request >> Jan 02, 2018  2:34 PM Neva Seat wrote: Pt needing some medical records and dates. Please call pt to discuss.

## 2018-01-16 NOTE — Telephone Encounter (Signed)
Pt s meds is keeping her awake. Would like advice... She's getting no sleep & still in pain.  Thanks, Vilinda Blanks.

## 2018-01-16 NOTE — Telephone Encounter (Signed)
Steroids can sometime do that.  She should be nearing the end, if not already completed her course.  I still do not see anywhere that she has obtained the lab or given [place] the report of her recent MRI that showed abnormalities, per pt.  She needs to follow up with medical requests.

## 2018-01-19 NOTE — Telephone Encounter (Signed)
Notified pt about the steroids. She states she has not had an recent MRI or labs.

## 2018-02-18 ENCOUNTER — Other Ambulatory Visit: Payer: Self-pay | Admitting: Emergency Medicine

## 2018-02-18 ENCOUNTER — Other Ambulatory Visit: Payer: Self-pay | Admitting: Physical Medicine & Rehabilitation

## 2018-02-19 NOTE — Telephone Encounter (Signed)
Zyrtec refill Last OV:06/11/17 Last refill:02/19/17 3 refills IOP:[id number] Pharmacy: [facility] [place], [place] [phone] (Phone) [phone] (Fax)

## 2018-07-14 ENCOUNTER — Other Ambulatory Visit: Payer: Self-pay | Admitting: Family Medicine

## 2018-07-17 ENCOUNTER — Telehealth: Payer: Self-pay | Admitting: *Deleted

## 2018-07-17 NOTE — Telephone Encounter (Addendum)
Alexandria Boyle called and left a message stating that she had tried to make appts several times (2X in Oct and 2x in Nov) but "no one called her back".  She says "now she does not have the money for the copay" and cannot come in for one. She is asking why Dr Posey Pronto would deny her medication refills requested by the pharmacy.  She is requesting a call back.    I called her back. (I do not see any previous telephone messages from her about requesting appts) She launched into a litany of reasons why she cannot come to appointments and why nothing suggested works for her. She states that her bones are so jacked up she can't go anywhere that is not near her house. She is asking for lidocaine ointment and allergy medication in liquid form (because she is allergic to her house). I informed her that allergy medication must come from PCP.  She says she can't get it from them and is searching for new PCP because she does not like the one she had or their staff.     Her last appt with Dr Posey Pronto was in May and she was supposed to come back in June. She did not.  I pointed out that the lidocaine was refilled once in July even though she did not schedule return appt in June as requested by Dr Posey Pronto.  She denied getting the ointment and said the pharmacy said "we" denied it. She went on listing reasons why Dr Posey Pronto should prescribe her medications,  but also listing reason why she cannot follow any of his suggestions or come to her appointments. At this point, I told her all I could do is send him the request,  but I let her know that if she is not willing to follow a plan of care she should understand that a physician is not obligated to keep refilling medications. She does not agree. I have given her the number for [facility] to search for a PCP.

## 2018-07-18 NOTE — Telephone Encounter (Signed)
Thank you.  We can refill the lidocaine ointment 1 last time, but this will be the last medication refill of any kind without and appointment and compliance with treatment plan, for now and for the future.  Thank you again.

## 2018-07-20 MED ORDER — LIDOCAINE 5 % EX OINT: TOPICAL_OINTMENT | CUTANEOUS | 0 refills | Status: DC

## 2018-07-20 NOTE — Telephone Encounter (Signed)
I called her to let her know.  While on the phone she says she hopes she can come in to an appt in January if she has the money for co pay.  In the meantime, she wants Dr Posey Pronto to research about her having itching underneath her spine and right pelvic area.  And she is sore there. Her left ribs are sore too.  She just wants him to have a heads up about it before she comes in so he can research it.

## 2018-07-20 NOTE — Addendum Note (Signed)
Addended by: Caro Hight on: 07/20/2018 01:04 PM   Modules accepted: Orders

## 2018-07-20 NOTE — Telephone Encounter (Signed)
Thank you :)

## 2018-08-26 ENCOUNTER — Encounter: Payer: Medicare Other | Attending: Physical Medicine & Rehabilitation | Admitting: Physical Medicine & Rehabilitation

## 2018-08-26 ENCOUNTER — Telehealth: Payer: Self-pay | Admitting: *Deleted

## 2018-08-26 ENCOUNTER — Encounter: Payer: Self-pay | Admitting: Physical Medicine & Rehabilitation

## 2018-08-26 VITALS — BP 133/84 | HR 95 | Ht 60.0 in | Wt 173.0 lb

## 2018-08-26 DIAGNOSIS — G479 Sleep disorder, unspecified: Secondary | ICD-10-CM

## 2018-08-26 DIAGNOSIS — G8929 Other chronic pain: Secondary | ICD-10-CM | POA: Diagnosis not present

## 2018-08-26 DIAGNOSIS — R52 Pain, unspecified: Secondary | ICD-10-CM

## 2018-08-26 DIAGNOSIS — M5441 Lumbago with sciatica, right side: Secondary | ICD-10-CM | POA: Diagnosis not present

## 2018-08-26 DIAGNOSIS — M5442 Lumbago with sciatica, left side: Secondary | ICD-10-CM | POA: Diagnosis not present

## 2018-08-26 DIAGNOSIS — D62 Acute posthemorrhagic anemia: Secondary | ICD-10-CM | POA: Diagnosis not present

## 2018-08-26 DIAGNOSIS — R55 Syncope and collapse: Secondary | ICD-10-CM

## 2018-08-26 DIAGNOSIS — M791 Myalgia, unspecified site: Secondary | ICD-10-CM | POA: Diagnosis not present

## 2018-08-26 DIAGNOSIS — R269 Unspecified abnormalities of gait and mobility: Secondary | ICD-10-CM | POA: Diagnosis not present

## 2018-08-26 MED ORDER — LIDOCAINE 5 % EX OINT: TOPICAL_OINTMENT | CUTANEOUS | 5 refills | Status: DC
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
## 2018-08-26 NOTE — Telephone Encounter (Signed)
error 

## 2018-10-16 ENCOUNTER — Other Ambulatory Visit: Payer: Self-pay

## 2018-10-16 ENCOUNTER — Ambulatory Visit (INDEPENDENT_AMBULATORY_CARE_PROVIDER_SITE_OTHER): Payer: Medicare Other | Admitting: Family

## 2018-10-16 ENCOUNTER — Encounter: Payer: Self-pay | Admitting: Family

## 2018-10-16 VITALS — BP 120/80 | HR 83 | Ht 60.0 in | Wt 176.0 lb

## 2018-10-16 DIAGNOSIS — R200 Anesthesia of skin: Secondary | ICD-10-CM

## 2018-10-16 DIAGNOSIS — Z124 Encounter for screening for malignant neoplasm of cervix: Secondary | ICD-10-CM

## 2018-10-16 DIAGNOSIS — Z13220 Encounter for screening for lipoid disorders: Secondary | ICD-10-CM

## 2018-10-16 DIAGNOSIS — R5383 Other fatigue: Secondary | ICD-10-CM

## 2018-10-16 DIAGNOSIS — Z1231 Encounter for screening mammogram for malignant neoplasm of breast: Secondary | ICD-10-CM

## 2018-10-16 DIAGNOSIS — J309 Allergic rhinitis, unspecified: Secondary | ICD-10-CM

## 2018-10-16 DIAGNOSIS — Z114 Encounter for screening for human immunodeficiency virus [HIV]: Secondary | ICD-10-CM

## 2018-10-16 DIAGNOSIS — R55 Syncope and collapse: Secondary | ICD-10-CM | POA: Diagnosis not present

## 2018-10-16 DIAGNOSIS — M797 Fibromyalgia: Secondary | ICD-10-CM

## 2018-10-16 MED ORDER — CETIRIZINE HCL 5 MG/5ML PO SOLN: 10.0000 mg | Freq: Every day | ORAL | 3 refills | Status: DC

## 2018-10-16 MED ORDER — FLUCONAZOLE 150 MG PO TABS: 150.0000 mg | ORAL_TABLET | Freq: Once | ORAL | 0 refills | Status: AC

## 2018-10-16 NOTE — Progress Notes (Signed)
Alexandria Boyle is a 43 y.o. female with the following history as recorded in Epic[employer]:  Patient Active Problem List   Diagnosis Date Noted  . Chronic midline low back pain with bilateral sciatica 08/26/2018  . Generalized pain 08/26/2018  . Miscarriage 04/24/2017  . Uterine fibroid 04/24/2017  . Vaginal bleeding in pregnancy, first trimester 03/28/2017  . Numbness 02/19/2017  . Abnormal vaginal bleeding 02/19/2017  . Chronic pain syndrome 02/19/2017  . Vaginal discharge 02/19/2017  . Chronic back pain 09/19/2015  . Anemia, iron deficiency 09/19/2015  . Fibromyalgia 09/19/2015  . Paresthesia 07/13/2015  . Syncope and collapse 07/13/2015    Current Outpatient Medications  Medication Sig Dispense Refill  . Ascorbic Acid (VITAMIN C PO) Take 1 tablet by mouth daily.    Marland Kitchen aspirin 81 MG tablet Take 1 tablet (81 mg total) by mouth daily. 90 tablet 4  . Camphor-Menthol-Capsicum (TIGER BALM PAIN RELIEVING) 80-24-16 MG PTCH Apply topically.    . capsaicin (ZOSTRIX) 0.025 % cream Apply topically 2 (two) times daily.    . cetirizine HCl (ZYRTEC) 5 MG/5ML SOLN Take 10 mLs (10 mg total) by mouth daily. 300 mL 3  . ferrous sulfate 325 (65 FE) MG tablet Take 1 tablet (325 mg total) by mouth 3 (three) times daily with meals. 90 tablet 3  . lidocaine (XYLOCAINE) 5 % ointment APPLY EXTERNALLY TO THE AFFECTED AREA UP TO FOUR TIMES DAILY AS NEEDED 1 tube per month to be refilled 35.44 g 5  . fluconazole (DIFLUCAN) 150 MG tablet Take 1 tablet (150 mg total) by mouth once for 1 dose. Repeat in 72 hours 2 tablet 0   No current facility-administered medications for this visit.     Allergies: Bee pollen; Tomato; Citrus; Lyrica [pregabalin]; Neurontin [gabapentin]; and Strawberry extract  Past Medical History:  Diagnosis Date  . Allergy   . Arthritis   . Back disorder   . Complex regional pain syndrome   . CRPS (complex regional pain syndrome type I)   . CRPS (complex regional pain syndrome type I)    . Depression   . Fibromyalgia   . Paresthesia 07/13/2015  . Syncope and collapse 07/13/2015    Past Surgical History:  Procedure Laterality Date  . CESAREAN SECTION      Family History  Problem Relation Age of Onset  . Diabetes Mother   . Cancer Father   . Cancer Maternal Grandfather   . Diabetes Maternal Grandfather     Social History   Tobacco Use  . Smoking status: Never Smoker  . Smokeless tobacco: Never Used  Substance Use Topics  . Alcohol use: No    Subjective:  Patient presents today as a new patient; history of CPRS, fibromyalgia; under the care of chronic pain specialist;  Notes that she needs to get her primary care needs up to date/ managed by a new provider; Very difficult historian- during course of visit, states that she has to remain lying down because her entire body will cramp if she sits up for too long; Not new problems for patient- symptoms present x 7-10 years; in reviewing old records, it looks like she has seen neurology for chronic numbness- no abnormality noted; she states she has seen rheumatology and no follow-up was needed; sees her pain doctor every 6 months; in reviewing last pain management note, cardiology referral was recommended due to history of syncope; patient states that she thinks "something is wrong with her circulation" and records indicate that she may have  taken Plavix at some time in the past; no records from her practice in [place] are available for review today;   Mentions that she feels she needs to see GYN- does not want to go back to previous GYN; has never had a mammogram;  Originally tells me she had her yearly CPE and labs done at home CPE and knows she is not diabetic; she then tells me she did not get any blood drawn at this home physical.  Mentions that she feels like she has a yeast infection but cannot tolerate speculum exam today because her body is hurting because she has been in our office for too long;  Also  requesting refill on her allergy medication;   She also notes that she will have disability paperwork that has to be completed by an MD- not NP and wants to know how our office can help with this for her.    Objective:  Vitals:   10/16/18 1200  BP: 120/80  Pulse: 83  SpO2: 99%  Weight: 176 lb (79.8 kg)  Height: 5' (1.524 m)    General: Well developed, well nourished, in no acute distress; prefers to remain lying down during course of visit  Skin : Warm and dry.  Head: Normocephalic and atraumatic  Eyes: Sclera and conjunctiva clear; pupils round and reactive to light; extraocular movements intact  Ears: External normal; canals clear; tympanic membranes normal  Oropharynx: Pink, supple. No suspicious lesions  Neck: Supple without thyromegaly, adenopathy  Lungs: Respirations unlabored; clear to auscultation bilaterally without wheeze, rales, rhonchi  CVS exam: normal rate and regular rhythm.  Musculoskeletal: No deformities; no active joint inflammation  Extremities: No edema, cyanosis, clubbing  Vessels: Symmetric bilaterally  Neurologic: Alert and oriented; speech intact; face symmetrical; CNII-XII intact without focal deficit; gait is unsteady but has no cane or walker  Assessment:  1. Screening mammogram, encounter for   2. Syncope, unspecified syncope type   3. Other fatigue   4. Lipid screening   5. Cervical cancer screening   6. Fibromyalgia   7. Allergic rhinitis, unspecified seasonality, unspecified trigger   8. Numbness     Plan:  1. Order for mammogram updated; 2. Refer to cardiology; ? Reason she was on Plavix in the past; no records available; may need vascular specialist as well; 3. She defers doing labs today- return at later date at her convenience for CBC, CMP, TSH, Vitamin D; will also repeat ANA, sed rate and RF; 4. Check lipid panel; 5. Refer to Gyn per patient request; did not want to do speculum exam today; opted to go ahead and treat for her  self-reported yeast infection with Diflucan.  6. Encouraged patient to consider using a cane or walker- she defers at this time. 7. Refill on Zyrtec as requested; 8. Check B12;   I did explain to patient that if she is going to need MD to sign repeated forms/ paperwork for her that she will need to establish elsewhere as I am her assigned provider at this practice and will not have an option for an MD signing forms for her.    No follow-ups on file.  Orders Placed This Encounter  Procedures  . MM Digital Screening    Standing Status:   Future    Standing Expiration Date:   12/16/2019    Order Specific Question:   Reason for Exam (SYMPTOM  OR DIAGNOSIS REQUIRED)    Answer:   screening mammogram    Order Specific Question:  Is the patient pregnant?    Answer:   No    Order Specific Question:   Preferred imaging location?    Answer:   [facility]  . CBC w/Diff    Standing Status:   Future    Standing Expiration Date:   10/16/2019  . Comp Met (CMET)    Standing Status:   Future    Standing Expiration Date:   10/16/2019  . Lipid panel    Standing Status:   Future    Standing Expiration Date:   10/16/2019  . TSH    Standing Status:   Future    Standing Expiration Date:   10/16/2019  . Ambulatory referral to Cardiology    Referral Priority:   Routine    Referral Type:   Consultation    Referral Reason:   Specialty Services Required    Requested Specialty:   Cardiology    Number of Visits Requested:   1  . Ambulatory referral to Gynecology    Referral Priority:   Routine    Referral Type:   Consultation    Referral Reason:   Specialty Services Required    Requested Specialty:   Gynecology    Number of Visits Requested:   1    Requested Prescriptions   Signed Prescriptions Disp Refills  . cetirizine HCl (ZYRTEC) 5 MG/5ML SOLN 300 mL 3    Sig: Take 10 mLs (10 mg total) by mouth daily.  . fluconazole (DIFLUCAN) 150 MG tablet 2 tablet 0    Sig: Take 1 tablet (150 mg total) by  mouth once for 1 dose. Repeat in 72 hours

## 2018-10-30 ENCOUNTER — Encounter: Payer: Self-pay | Admitting: Family

## 2018-11-23 ENCOUNTER — Telehealth: Payer: Self-pay | Admitting: Physical Medicine & Rehabilitation

## 2018-11-23 NOTE — Telephone Encounter (Signed)
PTN CALLED BACK AGAIN AND STATES YOU MUST CALL HER SHE IS ALREADY ON DISABILITY SHE DOESN'T NEED FOR YOU TO ASSIST WITH THAT - SHE WANTS YOU TO FILL OUT DOCUMENT BECAUSE OF ASSISTANCE WITH INCOME BASED ON FACT SHE IS DISABLED.  NEEDS YOU TO CALL TODAY PLEASE [phone].  NEEDS FOR YOU TO COMPLETE DOCUMENT -

## 2018-11-23 NOTE — Telephone Encounter (Signed)
Please schedule her for a telephone appointment, please a lot 40 minutes.  Thank you.

## 2018-11-24 ENCOUNTER — Telehealth: Payer: Self-pay | Admitting: Physical Medicine & Rehabilitation

## 2018-11-24 ENCOUNTER — Telehealth: Payer: Self-pay

## 2018-11-24 NOTE — Telephone Encounter (Signed)
Thank you :)

## 2018-11-24 NOTE — Telephone Encounter (Signed)
Sent patient a message via my-chart with info.

## 2018-11-24 NOTE — Telephone Encounter (Signed)
Copied from [place] [id number]. Topic: Referral - Request for Referral >> Nov 24, 2018  2:43 PM Scherrie Gerlach wrote: Pt would like a referral to a different neurologist.  She feels she is getting worse and [facility] did no help, she prefers not to go back there.  Pt also states the pain dr she is going to is doing absolutely nothing for her.  Pt also states she has no money to have labs done that [place] ordered.

## 2018-11-24 NOTE — Telephone Encounter (Signed)
Per Zorita Pang, Clinic Manager, Patient was told she will need to schedule an appointment to be assessed.  She will call us to schedule if she decides.

## 2018-11-24 NOTE — Telephone Encounter (Signed)
Pt called to return call about neurologist and pain management specialist. She is asking if there are any that her PCP would recommend since she has not seen her neurologist in almost 3 years. Pt also states she has an ear infection she describes her pain as a stabbing in her right ear. Please advise.

## 2018-11-24 NOTE — Telephone Encounter (Signed)
I tried to reach patient but her phone does not have voicemail set up so I was unable to leave her a message.

## 2018-11-24 NOTE — Telephone Encounter (Signed)
Phoned ptn to advise MD would need to see and evaluate before he would document with signature that permanent disability.  He has documented in his notes in 2019 she went on disability in 2014 - and had not worked since 2009 - but before he would sign a form to such - he would need a full assessment.  It was not to have her removed from [place] [facility]- as she is already approved -but if her expectation for him to sign of on documents he would have to evaluate.  I had to end call to get on webex - called back 2 hours later to book virtual appts as we are not seeing follow up in office due to covid and she said it was not going to help her she would wait until July.

## 2018-11-24 NOTE — Telephone Encounter (Signed)
I would recommend that she either talk to her neurologist or pain specialist for options as to who they think she should see. She is probably going to need to go to [place] or [facility] and I have no idea who she should see there.

## 2018-11-25 ENCOUNTER — Telehealth: Payer: Self-pay | Admitting: Internal Medicine

## 2018-11-25 ENCOUNTER — Ambulatory Visit (INDEPENDENT_AMBULATORY_CARE_PROVIDER_SITE_OTHER): Payer: Medicare Other | Admitting: Internal Medicine

## 2018-11-25 ENCOUNTER — Ambulatory Visit: Payer: Medicare Other | Admitting: Internal Medicine

## 2018-11-25 ENCOUNTER — Telehealth: Payer: Self-pay

## 2018-11-25 ENCOUNTER — Encounter: Payer: Self-pay | Admitting: Internal Medicine

## 2018-11-25 ENCOUNTER — Other Ambulatory Visit: Payer: Self-pay

## 2018-11-25 VITALS — BP 126/82 | HR 93 | Temp 98.9°F | Ht 60.0 in | Wt 181.0 lb

## 2018-11-25 DIAGNOSIS — M5442 Lumbago with sciatica, left side: Secondary | ICD-10-CM

## 2018-11-25 DIAGNOSIS — N926 Irregular menstruation, unspecified: Secondary | ICD-10-CM | POA: Diagnosis not present

## 2018-11-25 DIAGNOSIS — H60331 Swimmer's ear, right ear: Secondary | ICD-10-CM | POA: Diagnosis not present

## 2018-11-25 DIAGNOSIS — F419 Anxiety disorder, unspecified: Secondary | ICD-10-CM

## 2018-11-25 DIAGNOSIS — G8929 Other chronic pain: Secondary | ICD-10-CM

## 2018-11-25 DIAGNOSIS — M5441 Lumbago with sciatica, right side: Secondary | ICD-10-CM | POA: Diagnosis not present

## 2018-11-25 DIAGNOSIS — H6091 Unspecified otitis externa, right ear: Secondary | ICD-10-CM | POA: Insufficient documentation

## 2018-11-25 LAB — POCT URINE PREGNANCY: Preg Test, Ur: NEGATIVE

## 2018-11-25 MED ORDER — NEOMYCIN-POLYMYXIN-HC 1 % OT SOLN: 3.0000 [drp] | Freq: Four times a day (QID) | OTIC | 0 refills | Status: DC

## 2018-11-25 MED ORDER — TRAMADOL HCL 50 MG PO TABS: 50.0000 mg | ORAL_TABLET | Freq: Four times a day (QID) | ORAL | 0 refills | Status: DC | PRN

## 2018-11-25 MED ORDER — FLUCONAZOLE 150 MG PO TABS: ORAL_TABLET | ORAL | 1 refills | Status: DC

## 2018-11-25 MED ORDER — CIPROFLOXACIN HCL 500 MG PO TABS: 500.0000 mg | ORAL_TABLET | Freq: Two times a day (BID) | ORAL | 0 refills | Status: AC

## 2018-11-25 NOTE — Assessment & Plan Note (Signed)
situationally worse, declines change in tx,  to f/u any worsening symptoms or concerns

## 2018-11-25 NOTE — Telephone Encounter (Signed)
Created CRM incase she calls back.  (CRM copy) Tried to reach patient but her voicemail is not set up. If she calls back please let her know Alexandria Boyle has recommended she follow back up with her pain management specialist and let them decide where else she can go. Also in regards to her needing to see neuro, she should follow up with her pain specialist also to as she is not sure how to best help her. Also Alexandria Boyle is in agreement that she does need to seek out another PCP/MD who can better manage her needs. She is welcome to call any of the other [facility] providers within the practice to see who is taking new patients at this current time.

## 2018-11-25 NOTE — Telephone Encounter (Signed)
Pt would like to transfer care from [facility] to [place], Please advise

## 2018-11-25 NOTE — Telephone Encounter (Signed)
Very sorry, I am unable to accept at this time, thanks 

## 2018-11-25 NOTE — Progress Notes (Signed)
Subjective:    Patient ID: Alexandria Boyle, female    DOB: 02-24-1976, 43 y.o.   MRN: [id number]  HPI  Here inperson with 2 days onset rapidly worsening now severe right ear pain, cant lie down on it, hold the hand to the right ear but not really helping, no d/c or blood, denies high fever, chills, sinus pain or pressure, ST, cough and Pt denies chest pain, increased sob or doe, wheezing, orthopnea, PND, increased LE swelling, palpitations, dizziness or syncope.  Pt continues to have recurring LBP without change in severity, bowel or bladder change, fever, wt loss,  worsening LE pain/numbness/weakness, gait change or falls.  Denies worsening depressive symptoms, suicidal ideation, or panic; has ongoing anxiety Past Medical History:  Diagnosis Date  . Allergy   . Arthritis   . Back disorder   . Complex regional pain syndrome   . CRPS (complex regional pain syndrome type I)   . CRPS (complex regional pain syndrome type I)   . Depression   . Fibromyalgia   . Paresthesia 07/13/2015  . Syncope and collapse 07/13/2015   Past Surgical History:  Procedure Laterality Date  . CESAREAN SECTION      reports that she has never smoked. She has never used smokeless tobacco. She reports that she does not drink alcohol or use drugs. family history includes Cancer in her father and maternal grandfather; Diabetes in her maternal grandfather and mother. Allergies  Allergen Reactions  . Bee Pollen Swelling  . Tomato Itching and Swelling  . Citrus Rash  . Lyrica [Pregabalin] Rash  . Neurontin [Gabapentin] Swelling and Rash  . Strawberry Extract Itching and Rash   Current Outpatient Medications on File Prior to Visit  Medication Sig Dispense Refill  . Ascorbic Acid (VITAMIN C PO) Take 1 tablet by mouth daily.    Marland Kitchen aspirin 81 MG tablet Take 1 tablet (81 mg total) by mouth daily. 90 tablet 4  . Camphor-Menthol-Capsicum (TIGER BALM PAIN RELIEVING) 80-24-16 MG PTCH Apply topically.    . capsaicin  (ZOSTRIX) 0.025 % cream Apply topically 2 (two) times daily.    . cetirizine HCl (ZYRTEC) 5 MG/5ML SOLN Take 10 mLs (10 mg total) by mouth daily. 300 mL 3  . ferrous sulfate 325 (65 FE) MG tablet Take 1 tablet (325 mg total) by mouth 3 (three) times daily with meals. 90 tablet 3  . lidocaine (XYLOCAINE) 5 % ointment APPLY EXTERNALLY TO THE AFFECTED AREA UP TO FOUR TIMES DAILY AS NEEDED 1 tube per month to be refilled 35.44 g 5   No current facility-administered medications on file prior to visit.    Review of Systems  Constitutional: Negative for other unusual diaphoresis or sweats HENT: Negative for ear discharge or swelling Eyes: Negative for other worsening visual disturbances Respiratory: Negative for stridor or other swelling  Gastrointestinal: Negative for worsening distension or other blood Genitourinary: Negative for retention or other urinary change Musculoskeletal: Negative for other MSK pain or swelling Skin: Negative for color change or other new lesions Neurological: Negative for worsening tremors and other numbness  Psychiatric/Behavioral: Negative for worsening agitation or other fatigue All other system neg per pt    Objective:   Physical Exam BP 126/82   Pulse 93   Temp 98.9 F (37.2 C) (Oral)   Ht 5' (1.524 m)   Wt 181 lb (82.1 kg)   SpO2 99%   BMI 35.35 kg/m  VS noted, mild ill, holding right ear with hand, wants to lie  on left side on gurney for exam Constitutional: Pt appears in NAD HENT: Head: NCAT.  Right Ear: External ear normal. But with canal with 1-2+ red, tender, swelling without mucous, blood or other d/c Left Ear: External ear normal.  Eyes: . Pupils are equal, round, and reactive to light. Conjunctivae and EOM are normal Nose: without d/c or deformity Neck: Neck supple. Gross normal ROM Cardiovascular: Normal rate and regular rhythm.   Pulmonary/Chest: Effort normal and breath sounds without rales or wheezing.  Neurological: Pt is alert. At  baseline orientation, motor grossly intact Skin: Skin is warm. No rashes, other new lesions, no LE edema Psychiatric: Pt behavior is normal without agitation but 2+ nervous No other exam findings  POCT urine pregnancy  Order: [id number]  Status:  Final result Visible to patient:  No (Not Released) Dx:  Missed period   Ref Range & Units 10:03 31yr ago 44yr ago  Preg Test, Ur Negative Negative  POSITIVEAbnormal  R, CM Negative            Assessment & Plan:

## 2018-11-25 NOTE — Telephone Encounter (Signed)
noted 

## 2018-11-25 NOTE — Telephone Encounter (Signed)
Spoke to patient and gave message. She want sto see Alexandria Boyle until she finds an MD, she would also like Alexandria Boyle to sign the property tax form on Monday when she returns. Please advise

## 2018-11-25 NOTE — Telephone Encounter (Signed)
Needs to be seen

## 2018-11-25 NOTE — Assessment & Plan Note (Signed)
stable overall by history and exam, recent data reviewed with pt, and pt to continue medical treatment as before,  to f/u any worsening symptoms or concerns  

## 2018-11-25 NOTE — Patient Instructions (Addendum)
Your urine pregnancy testing was negative  Please take all new medication as prescribed - the antibiotic pills and drops, and pain medication  Please continue all other medications as before, and refills have been done if requested.  Please have the pharmacy call with any other refills you may need.  Please keep your appointments with your specialists as you may have planned

## 2018-11-25 NOTE — Telephone Encounter (Signed)
Tried to reach patient but her voicemail wasn't set up so unable to leaver her a message.

## 2018-11-25 NOTE — Telephone Encounter (Signed)
Patient has called back stating she does not wish to transfer from [place].  Patient is requesting a call back in regard.

## 2018-11-25 NOTE — Telephone Encounter (Signed)
Tried to reach patient but her voicemail wasn't set up so unable to leaver her a message. Will try and reach her back.

## 2018-11-25 NOTE — Telephone Encounter (Signed)
-----   Message from Marrian Salvage, [place] sent at 11/25/2018 12:28 PM EDT ----- I honestly have no idea how to help her. I really couldn't figure out what she is being treated for. I would recommend that she talk to her pain management provider about direction since they have been the ones seeing her most regularly.  ----- Message ----- From: Marcina Millard, CMA Sent: 11/25/2018   8:52 AM EDT To: Marrian Salvage, FNP  Regarding her neurology referral she states she hasn't been to [facility] in 3 years so not so sure she is going to be able to ask them to refer her to somewhere else?  Do you have any other recommendations her that she can go since she didn't want to go back to [facility]?  Let me know your thoughts.  Orlandis Sanden~

## 2018-11-25 NOTE — Telephone Encounter (Signed)
Appointment scheduled today with Dr Jenny Reichmann.

## 2018-11-25 NOTE — Assessment & Plan Note (Signed)
With moderate exam, and but also making her anxiety worse today it seems, for antibx course with topical and cipro asd, urine pregnancy neg,  to f/u any worsening symptoms or concerns

## 2018-11-25 NOTE — Telephone Encounter (Signed)
Tried to contact patient but no VM was set up

## 2018-11-25 NOTE — Telephone Encounter (Signed)
Please let her know I agree that it may be best for her to look another PCP. She could call some of our other [place] offices to establish care elsewhere.

## 2018-11-26 ENCOUNTER — Ambulatory Visit: Payer: Self-pay | Admitting: *Deleted

## 2018-11-26 MED ORDER — MECLIZINE HCL 25 MG PO TABS: 25.0000 mg | ORAL_TABLET | Freq: Three times a day (TID) | ORAL | 1 refills | Status: DC | PRN

## 2018-11-26 NOTE — Telephone Encounter (Signed)
[place] for add meclizine prn dizziness, and start all prescribed medications

## 2018-11-26 NOTE — Addendum Note (Signed)
Addended by: Biagio Borg on: 11/26/2018 10:26 AM   Modules accepted: Orders

## 2018-11-26 NOTE — Telephone Encounter (Signed)
Pt seen by Dr.John yesterday" Acute swimmer's ear of right side '  Reports multiple symptoms, onset last night: lightheadedness,mild itching, dry nose, bad dream. States took tramadol and ear drops together at 1900 last night and this am at 0100. States dizziness "Bad last night, just starting this AM." Also states "I find myself holding my breath at times." States has not started the antibiotic yet. Requesting advise regarding medications and listed symptoms. Please advise:  [phone]  Reason for Disposition . [1] MODERATE dizziness (e.g., interferes with normal activities) AND [2] has NOT been evaluated by physician for this  (Exception: dizziness caused by heat exposure, sudden standing, or poor fluid intake)  Answer Assessment - Initial Assessment Questions 1. DESCRIPTION: "Describe your dizziness."    Lightheadedness 2. LIGHTHEADED: "Do you feel lightheaded?" (e.g., somewhat faint, woozy, weak upon standing)     yes 3. VERTIGO: "Do you feel like either you or the room is spinning or tilting?" (i.e. vertigo)     At times 4. SEVERITY: "How bad is it?"  "Do you feel like you are going to faint?" "Can you stand and walk?"   - MILD - walking normally   - MODERATE - interferes with normal activities (e.g., work, school)    - SEVERE - unable to stand, requires support to walk, feels like passing out now.      Severe last night, "Just starting this AM." 5. ONSET:  "When did the dizziness begin?"     Last night 6. AGGRAVATING FACTORS: "Does anything make it worse?" (e.g., standing, change in head position)     no 7. HEART RATE: "Can you tell me your heart rate?" "How many beats in 15 seconds?"  (Note: not all patients can do this)       no 8. CAUSE: "What do you think is causing the dizziness?"     MAybe tramadol or ear drops 9. RECURRENT SYMPTOM: "Have you had dizziness before?" If so, ask: "When was the last time?" "What happened that time?"     no 10. OTHER SYMPTOMS: "Do you have any  other symptoms?" (e.g., fever, chest pain, vomiting, diarrhea, bleeding)       Dry nose, itching, "Hilding my breath at times" Bad dream 11. PREGNANCY: "Is there any chance you are pregnant?" "When was your last menstrual period?"       no  Protocols used: DIZZINESS [facility]

## 2018-11-30 ENCOUNTER — Telehealth: Payer: Self-pay

## 2018-11-30 ENCOUNTER — Telehealth: Payer: Self-pay | Admitting: Family Medicine

## 2018-11-30 ENCOUNTER — Telehealth: Payer: Self-pay | Admitting: Neurology

## 2018-11-30 MED ORDER — NEOMYCIN-POLYMYXIN-HC 1 % OT SOLN: 3.0000 [drp] | Freq: Four times a day (QID) | OTIC | 0 refills | Status: AC

## 2018-11-30 NOTE — Telephone Encounter (Signed)
Patient called and would like to see if Dr. Tomi Likens can fill out a form for her regarding her Disability and getting a tax deduction on her home that she purchased last year 11/2017? She saw Dr. Tomi Likens last in 06/2017. Please Call. Thanks

## 2018-11-30 NOTE — Telephone Encounter (Signed)
Done erx 

## 2018-11-30 NOTE — Telephone Encounter (Signed)
Spoke with patient and info given that Alexandria Boyle will not be filling out her form as it needs to be signed by an MD. I mailed form back to her per her request.

## 2018-11-30 NOTE — Telephone Encounter (Signed)
Copied from [place] [id number]. Topic: General - Inquiry >> Nov 30, 2018 10:14 AM Lionel December wrote: Reason for CRM: Pt called requesting Dr Carlota Raspberry sign a form for her property tax deduction due to disability. She will be faxing the form over to office.

## 2018-11-30 NOTE — Telephone Encounter (Signed)
I have not evaluated patient since 2018. I can look at the form, but her PCP is listed as Jodi Mourning, FNP.

## 2018-12-01 NOTE — Telephone Encounter (Signed)
No.  I have not found any neurologic cause of her symptoms.  I don't think her symptoms are neurologic.  Therefore, I cannot fill out disability forms.  This should be addressed to her PCP

## 2018-12-02 NOTE — Telephone Encounter (Signed)
Called and advised Pt. She said she is already on disability, needs forms filled out for tax deduction, and that must be filled out by a physician. Her PCP is a NP. I advised her all mid level providers work under physicians and should have that provider complete the forms. Pt said PCP advised they would not fill out forms.  I advised her she has not been seen here since 06/2017, she will need to contact PCP office again.

## 2019-02-25 ENCOUNTER — Ambulatory Visit: Payer: Medicare Other | Admitting: Physical Medicine & Rehabilitation

## 2019-04-22 ENCOUNTER — Telehealth: Payer: Self-pay | Admitting: Lactation Services

## 2019-04-22 NOTE — Telephone Encounter (Signed)
Pt called to ask about Iron Infusions. She reports she was a pt at [facility] in 2018, although records show she is a pt with Dr. Ronita Hipps. Pt was wanting to know where to get iron infusions. She is having labs drawn by PCP to evaluate. Pt reports she had iron infusions scheduled and did not go since she had a miscarriage at the same time in 2018.   Discussed that Iron infusion needs a doctors order. And is usually scheduled at [facility] at the [facility].

## 2019-09-02 ENCOUNTER — Inpatient Hospital Stay (HOSPITAL_COMMUNITY)
Admission: AD | Admit: 2019-09-02 | Discharge: 2019-09-02 | Disposition: A | Discharge status: Home / Self Care | Payer: Medicare Other | Attending: Obstetrics and Gynecology | Admitting: Obstetrics and Gynecology

## 2019-09-02 ENCOUNTER — Encounter (HOSPITAL_COMMUNITY): Payer: Self-pay | Admitting: Obstetrics and Gynecology

## 2019-09-02 ENCOUNTER — Other Ambulatory Visit: Payer: Self-pay

## 2019-09-02 DIAGNOSIS — Z3A22 22 weeks gestation of pregnancy: Secondary | ICD-10-CM | POA: Diagnosis not present

## 2019-09-02 DIAGNOSIS — R102 Pelvic and perineal pain: Secondary | ICD-10-CM | POA: Diagnosis not present

## 2019-09-02 DIAGNOSIS — Z3492 Encounter for supervision of normal pregnancy, unspecified, second trimester: Secondary | ICD-10-CM

## 2019-09-02 DIAGNOSIS — Z7982 Long term (current) use of aspirin: Secondary | ICD-10-CM | POA: Diagnosis not present

## 2019-09-02 DIAGNOSIS — O26892 Other specified pregnancy related conditions, second trimester: Secondary | ICD-10-CM | POA: Diagnosis not present

## 2019-09-02 DIAGNOSIS — R1024 Suprapubic pain: Secondary | ICD-10-CM

## 2019-09-02 DIAGNOSIS — O09522 Supervision of elderly multigravida, second trimester: Secondary | ICD-10-CM | POA: Diagnosis not present

## 2019-09-02 DIAGNOSIS — R109 Unspecified abdominal pain: Secondary | ICD-10-CM | POA: Insufficient documentation

## 2019-09-02 LAB — WET PREP, GENITAL
Clue Cells Wet Prep HPF POC: NONE SEEN
Sperm: NONE SEEN
Trich, Wet Prep: NONE SEEN
Yeast Wet Prep HPF POC: NONE SEEN

## 2019-09-02 LAB — URINALYSIS, ROUTINE W REFLEX MICROSCOPIC
Bilirubin Urine: NEGATIVE
Glucose, UA: NEGATIVE mg/dL
Ketones, ur: 20 mg/dL — AB
Leukocytes,Ua: NEGATIVE
Nitrite: NEGATIVE
Protein, ur: NEGATIVE mg/dL
Specific Gravity, Urine: 1.005 (ref 1.005–1.030)
pH: 6 (ref 5.0–8.0)

## 2019-09-02 MED ORDER — ACETAMINOPHEN 325 MG PO TABS: 650.0000 mg | ORAL_TABLET | ORAL | 2 refills | Status: DC | PRN

## 2019-09-02 NOTE — MAU Note (Signed)
PT SAYS SHE WAS GOING TO  DR Lynnette Caffey FOR GYN ISSUES- - NOT SEEN FOR 3-4 YEARS . BUT INSURANCE NOW  DID NOT COVER PREG.  WENT TO THEIR OFFICE YESTERDAY -  COULD NOT BE A PT. OFFICE IS SUPPOSE TO SEND HER RECORDS TO [facility] Marland Kitchen  HAD U/S ON 10-19- BY PPW.     FEELS PRESSURE IN LOWER ABD  AND PELVIC- STARTED X2 WEEKS . YESTERDAY WORSE .  NOW PAIN IS A 3 .  LAST SEX- DEC.

## 2019-09-02 NOTE — MAU Provider Note (Signed)
History     CSN: [id number]  Arrival date and time: 09/02/19 1836   First Provider Initiated Contact with Patient 09/02/19 1937      Chief Complaint  Patient presents with  . Pelvic Pain  . Abdominal Pain  . pelvic pressure   HPI Alexandria Boyle is a 44 y.o. G3P1011 at [redacted]w[redacted]d who presents to MAU with chief complaint of recurrent suprapubic pain, onset two weeks ago. This is a recurrent problem. Patient states she feels as if her baby's arm is protruding from her vagina. She took a single dose of children's Tylenol yesterday but did not experience relief. She denies dysuria, abdominal tenderness, vaginal bleeding, abnormal vaginal discharge, fever or recent illness. She is remote from intercourse. She lives with her daughter. She denies SI, HI, IPV.   OB history: cesarean delivery 1998. She has not initiated prenatal care this pregnancy but states her dating is based on first trimester scan performed at [facility] and she plans to coordinate care with [place].  OB History    Gravida  3   Para  1   Term  1   Preterm  0   AB  1   Living  1     SAB  1   TAB  0   Ectopic  0   Multiple  0   Live Births  1           Past Medical History:  Diagnosis Date  . Allergy   . Arthritis   . Back disorder   . Complex regional pain syndrome   . CRPS (complex regional pain syndrome type I)   . CRPS (complex regional pain syndrome type I)   . Depression   . Fibromyalgia   . Paresthesia 07/13/2015  . Syncope and collapse 07/13/2015    Past Surgical History:  Procedure Laterality Date  . CESAREAN SECTION      Family History  Problem Relation Age of Onset  . Diabetes Mother   . Cancer Father   . Cancer Maternal Grandfather   . Diabetes Maternal Grandfather     Social History   Tobacco Use  . Smoking status: Never Smoker  . Smokeless tobacco: Never Used  Substance Use Topics  . Alcohol use: No  . Drug use: No    Allergies:  Allergies  Allergen  Reactions  . Bee Pollen Swelling  . Tomato Itching and Swelling  . Citrus Rash  . Lyrica [Pregabalin] Rash  . Neurontin [Gabapentin] Swelling and Rash  . Strawberry Extract Itching and Rash    Medications Prior to Admission  Medication Sig Dispense Refill Last Dose  . lidocaine (XYLOCAINE) 5 % ointment APPLY EXTERNALLY TO THE AFFECTED AREA UP TO FOUR TIMES DAILY AS NEEDED 1 tube per month to be refilled 35.44 g 5 Past Week at Unknown time  . meclizine (ANTIVERT) 25 MG tablet Take 1 tablet (25 mg total) by mouth 3 (three) times daily as needed for dizziness. 40 tablet 1 Past Month at Unknown time  . Prenatal Vit-Fe Fumarate-FA (PRENATAL MULTIVITAMIN) TABS tablet Take 1 tablet by mouth daily at 12 noon.   09/01/2019 at Unknown time  . Ascorbic Acid (VITAMIN C PO) Take 1 tablet by mouth daily.   More than a month at Unknown time  . aspirin 81 MG tablet Take 1 tablet (81 mg total) by mouth daily. 90 tablet 4 08/31/2019  . Camphor-Menthol-Capsicum (TIGER BALM PAIN RELIEVING) 80-24-16 MG PTCH Apply topically.     Marland Kitchen  capsaicin (ZOSTRIX) 0.025 % cream Apply topically 2 (two) times daily.     . cetirizine HCl (ZYRTEC) 5 MG/5ML SOLN Take 10 mLs (10 mg total) by mouth daily. 300 mL 3 08/30/2019  . ferrous sulfate 325 (65 FE) MG tablet Take 1 tablet (325 mg total) by mouth 3 (three) times daily with meals. 90 tablet 3 08/31/2019  . fluconazole (DIFLUCAN) 150 MG tablet 1 tab by mouth every 3 days as needed 2 tablet 1 More than a month at Unknown time  . traMADol (ULTRAM) 50 MG tablet Take 1 tablet (50 mg total) by mouth every 6 (six) hours as needed. 30 tablet 0 More than a month at Unknown time    Review of Systems  Constitutional: Negative for chills, fatigue and fever.  Gastrointestinal: Positive for abdominal pain.  Genitourinary: Negative for difficulty urinating, dysuria, vaginal bleeding, vaginal discharge and vaginal pain.  Musculoskeletal: Negative for back pain.  Psychiatric/Behavioral: The  patient is nervous/anxious.   All other systems reviewed and are negative.  Physical Exam   Blood pressure 121/71, pulse 77, temperature 98.3 F (36.8 C), temperature source Oral, resp. rate 17, height 5' (1.524 m), weight 83.9 kg, SpO2 100 %, unknown if currently breastfeeding.  Physical Exam  Nursing note and vitals reviewed. Constitutional: She is oriented to person, place, and time. She appears well-developed and well-nourished.  Cardiovascular: Normal rate.  Respiratory: Effort normal and breath sounds normal.  GI: Soft. She exhibits no distension. There is no abdominal tenderness. There is no rebound, no guarding and no CVA tenderness.  Neurological: She is alert and oriented to person, place, and time.  Skin: Skin is warm and dry.  Psychiatric: She has a normal mood and affect. Her behavior is normal. Judgment and thought content normal.    MAU Course/MDM  Procedures  --Patient extremely agitated throughout evaluation in MAU. She is intermittently tearful due to her belief that a speculum exam caused her miscarriage several years ago. She declined pelvic exam, bimanual exam, and declined to show CNM her perineum until just before discharge from MAU. --S/p digital exam. Patient reassured her cervix is closed, no bleeding or CMT on exam, nothing protruding into her vagina or from her introitus.  Patient Vitals for the past 24 hrs:  BP Temp Temp src Pulse Resp SpO2 Height Weight  09/02/19 1928 121/71 -- -- 77 -- -- -- --  09/02/19 1859 121/72 98.3 F (36.8 C) Oral 77 17 100 % 5' (1.524 m) 83.9 kg   Results for orders placed or performed during the hospital encounter of 09/02/19 (from the past 24 hour(s))  Urinalysis, Routine w reflex microscopic     Status: Abnormal   Collection Time: 09/02/19  7:03 PM  Result Value Ref Range   Color, Urine STRAW (A) YELLOW   APPearance CLEAR CLEAR   Specific Gravity, Urine 1.005 1.005 - 1.030   pH 6.0 5.0 - 8.0   Glucose, UA NEGATIVE  NEGATIVE mg/dL   Hgb urine dipstick SMALL (A) NEGATIVE   Bilirubin Urine NEGATIVE NEGATIVE   Ketones, ur 20 (A) NEGATIVE mg/dL   Protein, ur NEGATIVE NEGATIVE mg/dL   Nitrite NEGATIVE NEGATIVE   Leukocytes,Ua NEGATIVE NEGATIVE   RBC / HPF 0-5 0 - 5 RBC/hpf   WBC, UA 0-5 0 - 5 WBC/hpf   Bacteria, UA RARE (A) NONE SEEN   Squamous Epithelial / LPF 0-5 0 - 5  Wet prep, genital     Status: Abnormal   Collection Time: 09/02/19  7:47  PM  Result Value Ref Range   Yeast Wet Prep HPF POC NONE SEEN NONE SEEN   Trich, Wet Prep NONE SEEN NONE SEEN   Clue Cells Wet Prep HPF POC NONE SEEN NONE SEEN   WBC, Wet Prep HPF POC FEW (A) NONE SEEN   Sperm NONE SEEN     Assessment and Plan  --44 y.o. .G3P1011 at [redacted]w[redacted]d  --FHT 142 by Doppler --No concerning findings on physical exam --Discharge home in stable condition  F/U: --Underscored importance of establishing prenatal care. Message sent to [facility] per patient request  Darlina Rumpf, CNM 09/02/2019, 9:02 PM

## 2019-09-02 NOTE — MAU Note (Signed)
Feeling pressure in suprapubic area, ongoing for a couple wk, usually when sitting, was a little worse today.  Feels like something is stuck in her vagina, feels like it moves down when the baby moves. No leaking, d/c or bleeding. Is peeing more. Not painful.  Has not been getting care.

## 2019-09-02 NOTE — Discharge Instructions (Signed)

## 2019-09-03 ENCOUNTER — Encounter (HOSPITAL_COMMUNITY): Payer: Self-pay | Admitting: Obstetrics & Gynecology

## 2019-09-03 ENCOUNTER — Inpatient Hospital Stay (HOSPITAL_BASED_OUTPATIENT_CLINIC_OR_DEPARTMENT_OTHER): Payer: Medicare Other

## 2019-09-03 ENCOUNTER — Inpatient Hospital Stay (HOSPITAL_COMMUNITY)
Admission: AD | Admit: 2019-09-03 | Discharge: 2019-09-03 | Disposition: A | Discharge status: Home / Self Care | Payer: Medicare Other | Attending: Obstetrics & Gynecology | Admitting: Obstetrics & Gynecology

## 2019-09-03 DIAGNOSIS — R1031 Right lower quadrant pain: Secondary | ICD-10-CM | POA: Insufficient documentation

## 2019-09-03 DIAGNOSIS — R102 Pelvic and perineal pain: Secondary | ICD-10-CM | POA: Insufficient documentation

## 2019-09-03 DIAGNOSIS — O26892 Other specified pregnancy related conditions, second trimester: Secondary | ICD-10-CM

## 2019-09-03 DIAGNOSIS — Z3A22 22 weeks gestation of pregnancy: Secondary | ICD-10-CM | POA: Insufficient documentation

## 2019-09-03 DIAGNOSIS — Z3A23 23 weeks gestation of pregnancy: Secondary | ICD-10-CM | POA: Diagnosis not present

## 2019-09-03 DIAGNOSIS — R42 Dizziness and giddiness: Secondary | ICD-10-CM | POA: Diagnosis present

## 2019-09-03 DIAGNOSIS — Z3490 Encounter for supervision of normal pregnancy, unspecified, unspecified trimester: Secondary | ICD-10-CM

## 2019-09-03 DIAGNOSIS — O0932 Supervision of pregnancy with insufficient antenatal care, second trimester: Secondary | ICD-10-CM

## 2019-09-03 DIAGNOSIS — R109 Unspecified abdominal pain: Secondary | ICD-10-CM

## 2019-09-03 DIAGNOSIS — O26899 Other specified pregnancy related conditions, unspecified trimester: Secondary | ICD-10-CM | POA: Diagnosis present

## 2019-09-03 DIAGNOSIS — Z7982 Long term (current) use of aspirin: Secondary | ICD-10-CM | POA: Insufficient documentation

## 2019-09-03 LAB — URINALYSIS, ROUTINE W REFLEX MICROSCOPIC
Bacteria, UA: NONE SEEN
Bilirubin Urine: NEGATIVE
Glucose, UA: NEGATIVE mg/dL
Ketones, ur: NEGATIVE mg/dL
Leukocytes,Ua: NEGATIVE
Nitrite: NEGATIVE
Protein, ur: NEGATIVE mg/dL
Specific Gravity, Urine: 1.019 (ref 1.005–1.030)
pH: 6 (ref 5.0–8.0)

## 2019-09-03 MED ORDER — COMFORT FIT MATERNITY SUPP SM MISC: 1.0000 [IU] | Freq: Every day | 0 refills | Status: DC | PRN

## 2019-09-03 NOTE — MAU Note (Signed)
.   Alexandria Boyle is a 44 y.o. at [redacted]w[redacted]d here in MAU reporting: Patient c/o dizziness that started around 0700 this morning. Patient was seen in MAU yesterday for vaginal pressure. Patient also reports x5 formed bowel movements in a 30 minute period which concerns her. She states that she has not had any prenatal care and is concerned that she is further along that 22 weeks. No VB or LOF. Patient reports good fetal movement.   Pain score: 4 Vitals:   09/03/19 1522  BP: 132/76  Pulse: (!) 101  Resp: 16  Temp: 98.6 F (37 C)  SpO2: 100%

## 2019-09-03 NOTE — MAU Provider Note (Signed)
History     CSN: [id number]  Arrival date and time: 09/03/19 1440   First Provider Initiated Contact with Patient 09/03/19 1651      Chief Complaint  Patient presents with  . Dizziness   HPI  Ms.  Alexandria Boyle is a 44 y.o. year old G18P1011 female at [redacted]w[redacted]d weeks gestation who presents to MAU reporting dizziness since 0700 today, "5 formed BMs in 30 minutes", vaginal pressure "feels like she needs to push" since last night; rated 4/10. She was seen in MAU last night and "no one did an ultrasound to check how many weeks I am and because I am having RLQ pain." She states, " I know I'm further along than they say I am. I'm sure She denies VB or LOF. She reports (+). She reports not having any PNC since 05/2019. She was a patient of [facility], but "was told she would have to pay $3600 for self-pay cost d/t Medicaid not being for pregnancy care." She states, " I didn't have that money to pay for that. She is working on getting pregnancy care added to her Medicaid.  Past Medical History:  Diagnosis Date  . Allergy   . Arthritis   . Back disorder   . Complex regional pain syndrome   . CRPS (complex regional pain syndrome type I)   . CRPS (complex regional pain syndrome type I)   . Depression   . Fibromyalgia   . Paresthesia 07/13/2015  . Syncope and collapse 07/13/2015    Past Surgical History:  Procedure Laterality Date  . CESAREAN SECTION      Family History  Problem Relation Age of Onset  . Diabetes Mother   . Cancer Father   . Cancer Maternal Grandfather   . Diabetes Maternal Grandfather     Social History   Tobacco Use  . Smoking status: Never Smoker  . Smokeless tobacco: Never Used  Substance Use Topics  . Alcohol use: No  . Drug use: No    Allergies:  Allergies  Allergen Reactions  . Bee Pollen Swelling  . Tomato Itching and Swelling  . Citrus Rash  . Lyrica [Pregabalin] Rash  . Neurontin [Gabapentin] Swelling and Rash  . Strawberry Extract  Itching and Rash    Medications Prior to Admission  Medication Sig Dispense Refill Last Dose  . aspirin 81 MG tablet Take 1 tablet (81 mg total) by mouth daily. 90 tablet 4 Past Week at Unknown time  . cetirizine HCl (ZYRTEC) 5 MG/5ML SOLN Take 10 mLs (10 mg total) by mouth daily. 300 mL 3 Past Week at Unknown time  . ferrous sulfate 325 (65 FE) MG tablet Take 1 tablet (325 mg total) by mouth 3 (three) times daily with meals. 90 tablet 3 Past Week at Unknown time  . acetaminophen (TYLENOL) 325 MG tablet Take 2 tablets (650 mg total) by mouth every 4 (four) hours as needed. 100 tablet 2   . Ascorbic Acid (VITAMIN C PO) Take 1 tablet by mouth daily.     . capsaicin (ZOSTRIX) 0.025 % cream Apply topically 2 (two) times daily.     . Prenatal Vit-Fe Fumarate-FA (PRENATAL MULTIVITAMIN) TABS tablet Take 1 tablet by mouth daily at 12 noon.       Review of Systems  Constitutional: Negative.   HENT: Negative.   Eyes: Negative.   Respiratory: Negative.   Cardiovascular: Negative.   Gastrointestinal: Negative.   Endocrine: Negative.   Genitourinary: Positive for pelvic pain (RLQ  pelvic pain).  Musculoskeletal: Negative.   Skin: Negative.   Neurological: Negative.   Hematological: Negative.   Psychiatric/Behavioral: Negative.    Physical Exam   Blood pressure 132/76, pulse (!) 101, temperature 98.6 F (37 C), temperature source Oral, resp. rate 16, height 5' (1.524 m), weight 83.9 kg, SpO2 100 %. Lying: 119/71, 84  Sitting: 117/77, 82  Standing (0 mins): 124/72, 90; (5 mins): 125/80, 86  Physical Exam  Nursing note and vitals reviewed. Constitutional: She is oriented to person, place, and time. She appears well-developed and well-nourished.  HENT:  Head: Normocephalic and atraumatic.  Eyes: Pupils are equal, round, and reactive to light.  Cardiovascular: Normal rate.  Respiratory: Effort normal.  GI: Soft.  Genitourinary:    Genitourinary Comments: FH=28 cm Declined pelvic exam d/t  "being checked last night"   Musculoskeletal:        General: Normal range of motion.     Cervical back: Normal range of motion.  Neurological: She is alert and oriented to person, place, and time.  Skin: Skin is warm and dry.  Psychiatric: She has a normal mood and affect. Her behavior is normal. Judgment and thought content normal.    MAU Course  Procedures  MDM CCUA OB MFM Limited U/S EKG - nmL Orthostatic VS - nmL  Results for orders placed or performed during the hospital encounter of 09/03/19 (from the past 24 hour(s))  Urinalysis, Routine w reflex microscopic     Status: Abnormal   Collection Time: 09/03/19  4:20 PM  Result Value Ref Range   Color, Urine YELLOW YELLOW   APPearance CLEAR CLEAR   Specific Gravity, Urine 1.019 1.005 - 1.030   pH 6.0 5.0 - 8.0   Glucose, UA NEGATIVE NEGATIVE mg/dL   Hgb urine dipstick SMALL (A) NEGATIVE   Bilirubin Urine NEGATIVE NEGATIVE   Ketones, ur NEGATIVE NEGATIVE mg/dL   Protein, ur NEGATIVE NEGATIVE mg/dL   Nitrite NEGATIVE NEGATIVE   Leukocytes,Ua NEGATIVE NEGATIVE   RBC / HPF 0-5 0 - 5 RBC/hpf   WBC, UA 0-5 0 - 5 WBC/hpf   Bacteria, UA NONE SEEN NONE SEEN   Mucus PRESENT     Media Information       Assessment and Plan  Intrauterine pregnancy  - Advised to establish prenatal care - List of OB providers given  Abdominal pain affecting pregnancy  - Information provided on abdominal pain during pregnancy  - Advised to take Tylenol 1000 mg every 6 hours for pain  Pelvic pain affecting pregnancy in second trimester, antepartum  - Information provided on pelvic pain and RLP - Advised to soak in a tub of warm water with 1/2 cup of Epsom salt  - Discharge patient - Patient verbalized an understanding of the plan of care and agrees.   Laury Deep, MSN, CNM 09/03/2019, 4:51 PM

## 2019-09-03 NOTE — Discharge Instructions (Signed)
PREGNANCY SUPPORT BELT: You are not alone, Seventy-five percent of women have some sort of abdominal or back pain at some point in their pregnancy. Your baby is growing at a fast pace, which means that your whole body is rapidly trying to adjust to the changes. As your uterus grows, your back may start feeling a bit under stress and this can result in back or abdominal pain that can go from mild, and therefore bearable, to severe pains that will not allow you to sit or lay down comfortably, When it comes to dealing with pregnancy-related pains and cramps, some pregnant women usually prefer natural remedies, which the market is filled with nowadays. For example, wearing a pregnancy support belt can help ease and lessen your discomfort and pain. WHAT ARE THE BENEFITS OF WEARING A PREGNANCY SUPPORT BELT? A pregnancy support belt provides support to the lower portion of the belly taking some of the weight of the growing uterus and distributing to the other parts of your body. It is designed make you comfortable and gives you extra support. Over the years, the pregnancy apparel market has been studying the needs and wants of pregnant women and they have come up with the most comfortable pregnancy support belts that woman could ever ask for. In fact, you will no longer have to wear a stretched-out or bulky pregnancy belt that is visible underneath your clothes and makes you feel even more uncomfortable. Nowadays, a pregnancy support belt is made of comfortable and stretchy materials that will not irritate your skin but will actually make you feel at ease and you will not even notice you are wearing it. They are easy to put on and adjust during the day and can be worn at night for additional support.  BENEFITS:  Relives Back pain  Relieves Abdominal Muscle and Leg Pain  Stabilizes the Pelvic Ring  Offers a Cushioned Abdominal Lift Pad  Relieves pressure on the Sciatic Nerve Within Minutes WHERE TO GET  YOUR PREGNANCY BELT:  [facility] psychotherapist [facility]  [address], [place] [address]  [phone]  [email or web address]  You may take Tylenol 1000 mg every 6 hours as you need it for pain. Soak in a warm tub of water with 1/2 cup Epsom salt dissolved in it.  [place] [facility] [employer] at [facility]     Phone: [phone]  [facility] [employer] at [place]             Phone: [place] [facility] [employer] at [place]             Phone: [place] [facility] [employer] at [place]  Phone: [place] for [place] at [facility]  Phone: [place] [facility] [place] at [place]  Phone: [facility]       Phone: [phone]  [facility]    Phone: [phone]   [facility])    Phone: [facility]    Phone: [phone]  [facility]    Phone: [place]    Phone: [phone]  [place] [facility]       Phone: [phone]   [place] [facility]  Phone: [place]   Phone: [phone]  [facility]   Phone: [phone]  [facility]  Phone: [phone]  [facility]    Phone: [phone]

## 2019-09-04 LAB — CULTURE, OB URINE: Culture: NO GROWTH

## 2019-09-06 LAB — GC/CHLAMYDIA PROBE AMP (~~LOC~~) NOT AT ARMC
Chlamydia: NEGATIVE
Comment: NEGATIVE
Comment: NORMAL
Neisseria Gonorrhea: NEGATIVE

## 2019-09-22 ENCOUNTER — Other Ambulatory Visit: Payer: Self-pay

## 2019-09-22 ENCOUNTER — Encounter: Payer: Self-pay | Admitting: Obstetrics & Gynecology

## 2019-09-22 ENCOUNTER — Ambulatory Visit (INDEPENDENT_AMBULATORY_CARE_PROVIDER_SITE_OTHER): Payer: Medicare Other | Admitting: Obstetrics & Gynecology

## 2019-09-22 VITALS — BP 120/79 | HR 103 | Wt 189.0 lb

## 2019-09-22 DIAGNOSIS — Z3490 Encounter for supervision of normal pregnancy, unspecified, unspecified trimester: Secondary | ICD-10-CM

## 2019-09-22 DIAGNOSIS — O09529 Supervision of elderly multigravida, unspecified trimester: Secondary | ICD-10-CM | POA: Insufficient documentation

## 2019-09-22 DIAGNOSIS — Z3480 Encounter for supervision of other normal pregnancy, unspecified trimester: Secondary | ICD-10-CM | POA: Insufficient documentation

## 2019-09-22 DIAGNOSIS — O0932 Supervision of pregnancy with insufficient antenatal care, second trimester: Secondary | ICD-10-CM

## 2019-09-22 DIAGNOSIS — R102 Pelvic and perineal pain: Secondary | ICD-10-CM

## 2019-09-22 DIAGNOSIS — Z3A25 25 weeks gestation of pregnancy: Secondary | ICD-10-CM

## 2019-09-22 DIAGNOSIS — D259 Leiomyoma of uterus, unspecified: Secondary | ICD-10-CM

## 2019-09-22 DIAGNOSIS — O26892 Other specified pregnancy related conditions, second trimester: Secondary | ICD-10-CM

## 2019-09-22 MED ORDER — PANTOPRAZOLE SODIUM 40 MG PO TBEC: 40.0000 mg | DELAYED_RELEASE_TABLET | Freq: Every day | ORAL | 3 refills | Status: DC

## 2019-09-22 MED ORDER — BLOOD PRESSURE KIT DEVI: 1.0000 | 0 refills | Status: DC

## 2019-09-22 NOTE — Progress Notes (Signed)
Subjective:previously seen at [facility] is a G3P1011 [redacted]w[redacted]d being seen today for her first obstetrical visit.  Her obstetrical history is significant for advanced maternal age and previous cesarean section. Patient does intend to breast feed. Pregnancy history fully reviewed.  Patient reports heartburn, nausea and ptyalism.  Vitals:   09/22/19 1519  BP: 120/79  Pulse: (!) 103  Weight: 189 lb (85.7 kg)    HISTORY: OB History  Gravida Para Term Preterm AB Living  3 1 1  0 1 1  SAB TAB Ectopic Multiple Live Births  1 0 0 0 1    # Outcome Date GA Lbr Len/2nd Weight Sex Delivery Anes PTL Lv  3 Current           2 Term 11/30/96 [redacted]w[redacted]d  8 lb 6 oz (3.799 kg) F CS-Unspec        Complications: Fetal Intolerance  1 SAB            Past Medical History:  Diagnosis Date  . Allergy   . Arthritis   . Back disorder   . Complex regional pain syndrome   . CRPS (complex regional pain syndrome type I)   . CRPS (complex regional pain syndrome type I)   . Depression   . Fibromyalgia   . Paresthesia 07/13/2015  . Syncope and collapse 07/13/2015   Past Surgical History:  Procedure Laterality Date  . CESAREAN SECTION     Family History  Problem Relation Age of Onset  . Diabetes Mother   . Cancer Father   . Cancer Maternal Grandfather   . Diabetes Maternal Grandfather      Exam    Uterus:     Pelvic Exam:    Perineum:    Vulva:    Vagina:     pH:    Cervix:    Adnexa:    Bony Pelvis:   System: Breast:  normal appearance, no masses or tenderness,     Skin: normal coloration and turgor, no rashes    Neurologic: oriented, normal mood   Extremities: normal strength, tone, and muscle mass   HEENT PERRLA   Mouth/Teeth dental hygiene good   Neck supple   Cardiovascular: regular rate and rhythm   Respiratory:  appears well, vitals normal, no respiratory distress, acyanotic, normal RR   Abdomen: soft, non-tender; bowel sounds normal; no masses,  no  organomegaly   Urinary:        Assessment:    Pregnancy: G3P1011 Patient Active Problem List   Diagnosis Date Noted  . Supervision of other normal pregnancy, antepartum 09/22/2019  . High-risk pregnancy, multigravida of advanced maternal age, antepartum 09/22/2019  . Intrauterine pregnancy 09/03/2019  . Pelvic pain affecting pregnancy 09/03/2019  . No prenatal care in current pregnancy in second trimester 09/03/2019  . External otitis of right ear 11/25/2018  . Anxiety 11/25/2018  . Chronic midline low back pain with bilateral sciatica 08/26/2018  . Generalized pain 08/26/2018  . Uterine fibroid 04/24/2017  . Numbness 02/19/2017  . Chronic pain syndrome 02/19/2017  . Vaginal discharge 02/19/2017  . Chronic back pain 09/19/2015  . Anemia, iron deficiency 09/19/2015  . Fibromyalgia 09/19/2015  . Paresthesia 07/13/2015  . Syncope and collapse 07/13/2015        Plan:     Initial labs drawn. Prenatal vitamins. Problem list reviewed and updated. Genetic Screening discussed  declined.  Ultrasound; fetal survey: ordered.  Follow up in 2 weeks. 50% of 31min visit  spent on counseling and coordination of care.  TOLAC discussed with the patient   Emeterio Reeve 09/22/2019

## 2019-09-22 NOTE — Patient Instructions (Signed)
Pregnancy After Age 42 Women who become pregnant after the age of 20 have a higher risk for certain problems during pregnancy. This is because older women may already have health problems before becoming pregnant. Older women who are healthy before pregnancy may still develop problems during pregnancy. These problems may affect the mother, the unborn baby (fetus), or both. What are the risks for me? If you are over age 15 and you want to become pregnant or are pregnant, you may have a higher risk of:  Not being able to get pregnant (infertility).  Going into labor early (preterm labor).  Needing surgical delivery of your baby (cesarean delivery, or C-section).  Having high blood pressure (hypertension).  Having complications during pregnancy, such as high blood pressure and other symptoms (preeclampsia).  Having diabetes during pregnancy (gestational diabetes).  Being pregnant with more than one baby.  Loss of the unborn baby before 20 weeks (miscarriage) or after 20 weeks of pregnancy (stillbirth). What are the risks for my baby? Babies born to women over the age of 21 have a higher risk for:  Being born early (prematurity).  Low birth weight, which is less than 5 lb, 8 oz (2.5 kg).  Birth defects, such as Down syndrome and cleft palate.  Health complications, including problems with growth and development. How is prenatal care different for women over age 59? All women should see their health care provider before they try to become pregnant. This is especially important for women over the age of 7. Tell your health care provider about:  Any health problems you have.  Any medicines you take.  Any family history of health problems or chromosome-related defects.  Any problems you have had with past pregnancies or deliveries. If you are over age 45 and you plan to become pregnant:  Start taking a daily multivitamin a month or more before you try to get pregnant. Your  multivitamin should contain 400 mcg (micrograms) of folic acid. If you are over age 24 and pregnant, make sure you:  Keep taking your multivitamin unless your health care provider tells you not to take it.  Keep all prenatal visits as told by your health care provider. This is important.  Have ultrasounds regularly throughout your pregnancy to check for problems.  Talk with your health care provider about other prenatal screening tests that you may need. What additional prenatal tests are needed? Screening tests show whether your baby has a higher risk for birth defects than other babies. Screening tests include:  Ultrasound tests to look for markers that indicate a risk for birth defects.  Maternal blood screening. These are blood tests that measure certain substances in your blood to determine your baby's risk for defects. Screening tests do not show whether your baby has or does not have defects. They only show your baby's risk for certain defects. If your screening tests show that risk factors are present, you may need tests to confirm the defect (diagnostic testing). These tests may include:  Chorionic villus sampling. For this procedure, a tissue sample is taken from the organ that forms in your uterus to nourish your baby (placenta). The sample is removed through your cervix or abdomen and tested.  Amniocentesis. For this procedure, a small amount of the fluid that surrounds the baby in the uterus (amniotic fluid) is removed and tested. What can I do to stay healthy during my pregnancy? Staying healthy during pregnancy can help you and your baby to have a lower risk for  problems during pregnancy, during delivery, or both. Talk with your health care provider for specific instructions about staying healthy during your pregnancy. Nutrition   At each meal, eat a variety of foods from each of the five food groups. These groups include: ? Proteins such as lean meats, poultry, fish that is  low in fat, beans, eggs, and nuts. ? Vegetables such as leafy greens, raw and cooked vegetables, and vegetable juice. ? Fruits that are fresh, frozen, or canned, or 100% fruit juice. ? Dairy products such as low-fat yogurt, cheese, and milk. ? Whole grains including rice, cereal, pasta, and bread.  Talk with your health care provider about how much food in each group is right for you.  Follow instructions from your health care provider about eating and drinking restrictions during pregnancy. ? Do not eat raw eggs, raw meat, or raw fish or seafood. ? Do not eat any fish that contains high amounts of mercury, such as swordfish or mackerel.  Drink 6-8 or more glasses of water a day. You should drink enough fluid to keep your urine pale yellow. Managing weight gain  Ask your health care provider how much weight gain is healthy during pregnancy.  Stay at a healthy weight. If needed, work with your health care provider to lose weight safely. Activity  Exercise regularly, as directed by your health care provider. Ask your health care provider what forms of exercise are safe for you. General instructions  Do not use any products that contain nicotine or tobacco, such as cigarettes and e-cigarettes. If you need help quitting, ask your health care provider.  Do not drink alcohol, use drugs, or abuse prescription medicine.  Take over-the-counter and prescription medicines only as told by your health care provider.  Do not use hot tubs, steam rooms, or saunas.  Talk with your health care provider about your risk of exposure to harmful environmental conditions. This includes exposure to chemicals, radiation, cleaning products, and cat feces. Follow advice from your health care provider about how to limit your exposure. Summary  Women who become pregnant after the age of 6 have a higher risk for complications during pregnancy.  Problems may affect the mother, the unborn baby (fetus), or  both.  All women should see their health care provider before they try to become pregnant. This is especially important for women over the age of 61.  Staying healthy during pregnancy can help both you and your baby to have a lower risk for some of the problems that can happen during pregnancy, during delivery, or both. This information is not intended to replace advice given to you by your health care provider. Make sure you discuss any questions you have with your health care provider. Document Revised: 11/13/2018 Document Reviewed: 11/11/2016 Elsevier Patient Education  [place].

## 2019-09-22 NOTE — Progress Notes (Signed)
Pt declines flu vaccine and pap today, would like to wait until PP for pap smear.

## 2019-09-23 ENCOUNTER — Encounter: Payer: Medicare Other | Admitting: Obstetrics and Gynecology

## 2019-09-23 LAB — OBSTETRIC PANEL, INCLUDING HIV
Antibody Screen: NEGATIVE
Basophils Absolute: 0 10*3/uL (ref 0.0–0.2)
Basos: 0 %
EOS (ABSOLUTE): 0.1 10*3/uL (ref 0.0–0.4)
Eos: 1 %
HIV Screen 4th Generation wRfx: NONREACTIVE
Hematocrit: 31.5 % — ABNORMAL LOW (ref 34.0–46.6)
Hemoglobin: 10.7 g/dL — ABNORMAL LOW (ref 11.1–15.9)
Hepatitis B Surface Ag: NEGATIVE
Immature Grans (Abs): 0.1 10*3/uL (ref 0.0–0.1)
Immature Granulocytes: 1 %
Lymphocytes Absolute: 1.5 10*3/uL (ref 0.7–3.1)
Lymphs: 16 %
MCH: 28.6 pg (ref 26.6–33.0)
MCHC: 34 g/dL (ref 31.5–35.7)
MCV: 84 fL (ref 79–97)
Monocytes Absolute: 0.6 10*3/uL (ref 0.1–0.9)
Monocytes: 6 %
Neutrophils Absolute: 7.3 10*3/uL — ABNORMAL HIGH (ref 1.4–7.0)
Neutrophils: 76 %
Platelets: 194 10*3/uL (ref 150–450)
RBC: 3.74 x10E6/uL — ABNORMAL LOW (ref 3.77–5.28)
RDW: 13 % (ref 11.7–15.4)
RPR Ser Ql: NONREACTIVE
Rh Factor: POSITIVE
Rubella Antibodies, IGG: 4.76 index (ref >0.99)
WBC: 9.5 10*3/uL (ref 3.4–10.8)

## 2019-09-24 LAB — CULTURE, OB URINE

## 2019-09-24 LAB — URINE CULTURE, OB REFLEX

## 2019-10-06 ENCOUNTER — Ambulatory Visit (HOSPITAL_COMMUNITY)
Admission: RE | Admit: 2019-10-06 | Discharge: 2019-10-06 | Disposition: A | Discharge status: Home / Self Care | Payer: Medicare Other | Source: Ambulatory Visit | Attending: Obstetrics & Gynecology | Admitting: Obstetrics & Gynecology

## 2019-10-06 ENCOUNTER — Other Ambulatory Visit (HOSPITAL_COMMUNITY): Payer: Self-pay | Admitting: *Deleted

## 2019-10-06 ENCOUNTER — Ambulatory Visit (HOSPITAL_COMMUNITY): Payer: Medicare Other | Admitting: *Deleted

## 2019-10-06 ENCOUNTER — Encounter (HOSPITAL_COMMUNITY): Payer: Self-pay

## 2019-10-06 ENCOUNTER — Other Ambulatory Visit: Payer: Self-pay

## 2019-10-06 DIAGNOSIS — O09292 Supervision of pregnancy with other poor reproductive or obstetric history, second trimester: Secondary | ICD-10-CM | POA: Diagnosis not present

## 2019-10-06 DIAGNOSIS — O35EXX0 Maternal care for other (suspected) fetal abnormality and damage, fetal genitourinary anomalies, not applicable or unspecified: Secondary | ICD-10-CM

## 2019-10-06 DIAGNOSIS — Z3A19 19 weeks gestation of pregnancy: Secondary | ICD-10-CM

## 2019-10-06 DIAGNOSIS — R102 Pelvic and perineal pain: Secondary | ICD-10-CM | POA: Diagnosis present

## 2019-10-06 DIAGNOSIS — O26892 Other specified pregnancy related conditions, second trimester: Secondary | ICD-10-CM

## 2019-10-06 DIAGNOSIS — Z3480 Encounter for supervision of other normal pregnancy, unspecified trimester: Secondary | ICD-10-CM | POA: Insufficient documentation

## 2019-10-06 DIAGNOSIS — O0932 Supervision of pregnancy with insufficient antenatal care, second trimester: Secondary | ICD-10-CM | POA: Insufficient documentation

## 2019-10-06 DIAGNOSIS — Z3490 Encounter for supervision of normal pregnancy, unspecified, unspecified trimester: Secondary | ICD-10-CM

## 2019-10-06 DIAGNOSIS — O358XX0 Maternal care for other (suspected) fetal abnormality and damage, not applicable or unspecified: Secondary | ICD-10-CM | POA: Insufficient documentation

## 2019-10-06 DIAGNOSIS — O09529 Supervision of elderly multigravida, unspecified trimester: Secondary | ICD-10-CM

## 2019-10-07 ENCOUNTER — Other Ambulatory Visit: Payer: Medicare Other

## 2019-10-07 ENCOUNTER — Encounter: Payer: Medicare Other | Admitting: Obstetrics and Gynecology

## 2019-10-07 DIAGNOSIS — O35EXX0 Maternal care for other (suspected) fetal abnormality and damage, fetal genitourinary anomalies, not applicable or unspecified: Secondary | ICD-10-CM | POA: Insufficient documentation

## 2019-10-07 DIAGNOSIS — O358XX0 Maternal care for other (suspected) fetal abnormality and damage, not applicable or unspecified: Secondary | ICD-10-CM | POA: Insufficient documentation

## 2019-10-13 ENCOUNTER — Telehealth (INDEPENDENT_AMBULATORY_CARE_PROVIDER_SITE_OTHER): Payer: Medicare Other | Admitting: Family Medicine

## 2019-10-13 VITALS — BP 127/79 | HR 89

## 2019-10-13 DIAGNOSIS — Z3A28 28 weeks gestation of pregnancy: Secondary | ICD-10-CM | POA: Diagnosis not present

## 2019-10-13 DIAGNOSIS — O09529 Supervision of elderly multigravida, unspecified trimester: Secondary | ICD-10-CM

## 2019-10-13 DIAGNOSIS — O09523 Supervision of elderly multigravida, third trimester: Secondary | ICD-10-CM | POA: Diagnosis not present

## 2019-10-13 DIAGNOSIS — Z98891 History of uterine scar from previous surgery: Secondary | ICD-10-CM | POA: Diagnosis not present

## 2019-10-13 NOTE — Progress Notes (Signed)
TELEHEALTH VIRTUAL OBSTETRICS VISIT ENCOUNTER NOTE  I connected with Alexandria Boyle on 10/13/19 at  4:00 PM EST by telephone at home and verified that I am speaking with the correct person using two identifiers.   I discussed the limitations, risks, security and privacy concerns of performing an evaluation and management service by telephone and the availability of in person appointments. I also discussed with the patient that there may be a patient responsible charge related to this service. The patient expressed understanding and agreed to proceed.  Subjective:  Alexandria Boyle is a 44 y.o. G3P1011 at [redacted]w[redacted]d being followed for ongoing prenatal care.  She is currently monitored for the following issues for this high-risk pregnancy and has Paresthesia; Syncope and collapse; Chronic back pain; Anemia, iron deficiency; Fibromyalgia; Numbness; Chronic pain syndrome; Vaginal discharge; Uterine fibroid; Chronic midline low back pain with bilateral sciatica; Generalized pain; Anxiety; Pelvic pain affecting pregnancy; No prenatal care in current pregnancy in second trimester; Supervision of other normal pregnancy, antepartum; High-risk pregnancy, multigravida of advanced maternal age, antepartum; and Fetal renal anomaly, single gestation on their problem list.  Patient reports occasional pressure but otherwise feeling well. Reports fetal movement. Denies any contractions, bleeding or leaking of fluid.   The following portions of the patient's history were reviewed and updated as appropriate: allergies, current medications, past family history, past medical history, past social history, past surgical history and problem list.   Objective:   Vitals:   10/13/19 1554  BP: 127/79  Pulse: 89    General:  Alert, oriented and cooperative.   Mental Status: Normal mood and affect perceived. Normal judgment and thought content.  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  I discussed the assessment and treatment plan with the patient. The patient was provided an opportunity to ask questions and all were answered. The patient agreed with the plan and demonstrated an understanding of the instructions. The patient was advised to call back or seek an in-person office evaluation/go to MAU at [facility] for any urgent or concerning symptoms. Please refer to After Visit Summary for other counseling recommendations.   I provided 30 minutes of non-face-to-face time during this encounter.  Return in about 4 weeks (around 11/10/2019) for [facility]; in-person .  Future Appointments  Date Time Provider [place]  10/20/2019  8:30 AM [facility] None  11/03/2019  2:30 PM [place] [facility]  11/03/2019  2:30 PM [facility] [place] 1 [facility]-US  11/10/2019  4:00 PM Constant, Peggy, MD [place] None    Chauncey Mann, [place]  for [employer], [place]

## 2019-10-13 NOTE — Patient Instructions (Signed)
Glucose Tolerance Test During Pregnancy Why am I having this test? The glucose tolerance test (GTT) is done to check how your body processes sugar (glucose). This is one of several tests used to diagnose diabetes that develops during pregnancy (gestational diabetes mellitus). Gestational diabetes is a temporary form of diabetes that some women develop during pregnancy. It usually occurs during the second trimester of pregnancy and goes away after delivery. Testing (screening) for gestational diabetes usually occurs between 24 and 28 weeks of pregnancy. You may have the GTT test after having a 1-hour glucose screening test if the results from that test indicate that you may have gestational diabetes. You may also have this test if:  You have a history of gestational diabetes.  You have a history of giving birth to very large babies or have experienced repeated fetal loss (stillbirth).  You have signs and symptoms of diabetes, such as: ? Changes in your vision. ? Tingling or numbness in your hands or feet. ? Changes in hunger, thirst, and urination that are not otherwise explained by your pregnancy. What is being tested? This test measures the amount of glucose in your blood at different times during a period of 3 hours. This indicates how well your body is able to process glucose. What kind of sample is taken?  Blood samples are required for this test. They are usually collected by inserting a needle into a blood vessel. How do I prepare for this test?  For 3 days before your test, eat normally. Have plenty of carbohydrate-rich foods.  Follow instructions from your health care provider about: ? Eating or drinking restrictions on the day of the test. You may be asked to not eat or drink anything other than water (fast) starting 8-10 hours before the test. ? Changing or stopping your regular medicines. Some medicines may interfere with this test. Tell a health care provider about:  All  medicines you are taking, including vitamins, herbs, eye drops, creams, and over-the-counter medicines.  Any blood disorders you have.  Any surgeries you have had.  Any medical conditions you have. What happens during the test? First, your blood glucose will be measured. This is referred to as your fasting blood glucose, since you fasted before the test. Then, you will drink a glucose solution that contains a certain amount of glucose. Your blood glucose will be measured again 1, 2, and 3 hours after drinking the solution. This test takes about 3 hours to complete. You will need to stay at the testing location during this time. During the testing period:  Do not eat or drink anything other than the glucose solution.  Do not exercise.  Do not use any products that contain nicotine or tobacco, such as cigarettes and e-cigarettes. If you need help stopping, ask your health care provider. The testing procedure may vary among health care providers and hospitals. How are the results reported? Your results will be reported as milligrams of glucose per deciliter of blood (mg/dL) or millimoles per liter (mmol/L). Your health care provider will compare your results to normal ranges that were established after testing a large group of people (reference ranges). Reference ranges may vary among labs and hospitals. For this test, common reference ranges are:  Fasting: less than 95-105 mg/dL (5.3-5.8 mmol/L).  1 hour after drinking glucose: less than 180-190 mg/dL (10.0-10.5 mmol/L).  2 hours after drinking glucose: less than 155-165 mg/dL (8.6-9.2 mmol/L).  3 hours after drinking glucose: 140-145 mg/dL (7.8-8.1 mmol/L). What do the   results mean? Results within reference ranges are considered normal, meaning that your glucose levels are well-controlled. If two or more of your blood glucose levels are high, you may be diagnosed with gestational diabetes. If only one level is high, your health care  provider may suggest repeat testing or other tests to confirm a diagnosis. Talk with your health care provider about what your results mean. Questions to ask your health care provider Ask your health care provider, or the department that is doing the test:  When will my results be ready?  How will I get my results?  What are my treatment options?  What other tests do I need?  What are my next steps? Summary  The glucose tolerance test (GTT) is one of several tests used to diagnose diabetes that develops during pregnancy (gestational diabetes mellitus). Gestational diabetes is a temporary form of diabetes that some women develop during pregnancy.  You may have the GTT test after having a 1-hour glucose screening test if the results from that test indicate that you may have gestational diabetes. You may also have this test if you have any symptoms or risk factors for gestational diabetes.  Talk with your health care provider about what your results mean. This information is not intended to replace advice given to you by your health care provider. Make sure you discuss any questions you have with your health care provider. Document Revised: 11/12/2018 Document Reviewed: 03/03/2017 Elsevier Patient Education  2020 Elsevier Inc.  

## 2019-10-14 ENCOUNTER — Encounter: Payer: Self-pay | Admitting: Family Medicine

## 2019-10-14 DIAGNOSIS — O99210 Obesity complicating pregnancy, unspecified trimester: Secondary | ICD-10-CM | POA: Insufficient documentation

## 2019-10-14 DIAGNOSIS — O34219 Maternal care for unspecified type scar from previous cesarean delivery: Secondary | ICD-10-CM | POA: Insufficient documentation

## 2019-10-20 ENCOUNTER — Other Ambulatory Visit: Payer: Medicare Other

## 2019-11-03 ENCOUNTER — Other Ambulatory Visit (HOSPITAL_COMMUNITY): Payer: Self-pay | Admitting: *Deleted

## 2019-11-03 ENCOUNTER — Encounter (HOSPITAL_COMMUNITY): Payer: Self-pay

## 2019-11-03 ENCOUNTER — Ambulatory Visit (HOSPITAL_COMMUNITY)
Admission: RE | Admit: 2019-11-03 | Discharge: 2019-11-03 | Disposition: A | Discharge status: Home / Self Care | Payer: Medicare Other | Source: Ambulatory Visit | Attending: Obstetrics and Gynecology | Admitting: Obstetrics and Gynecology

## 2019-11-03 ENCOUNTER — Ambulatory Visit (HOSPITAL_COMMUNITY): Payer: Medicare Other | Admitting: *Deleted

## 2019-11-03 ENCOUNTER — Other Ambulatory Visit: Payer: Self-pay

## 2019-11-03 DIAGNOSIS — O0932 Supervision of pregnancy with insufficient antenatal care, second trimester: Secondary | ICD-10-CM | POA: Diagnosis present

## 2019-11-03 DIAGNOSIS — Z3480 Encounter for supervision of other normal pregnancy, unspecified trimester: Secondary | ICD-10-CM

## 2019-11-03 DIAGNOSIS — O09529 Supervision of elderly multigravida, unspecified trimester: Secondary | ICD-10-CM | POA: Diagnosis not present

## 2019-11-03 DIAGNOSIS — O34219 Maternal care for unspecified type scar from previous cesarean delivery: Secondary | ICD-10-CM

## 2019-11-03 DIAGNOSIS — Z3A31 31 weeks gestation of pregnancy: Secondary | ICD-10-CM

## 2019-11-03 DIAGNOSIS — D259 Leiomyoma of uterus, unspecified: Secondary | ICD-10-CM

## 2019-11-03 DIAGNOSIS — O359XX0 Maternal care for (suspected) fetal abnormality and damage, unspecified, not applicable or unspecified: Secondary | ICD-10-CM | POA: Diagnosis not present

## 2019-11-03 DIAGNOSIS — O09523 Supervision of elderly multigravida, third trimester: Secondary | ICD-10-CM | POA: Diagnosis not present

## 2019-11-03 DIAGNOSIS — O43193 Other malformation of placenta, third trimester: Secondary | ICD-10-CM

## 2019-11-03 DIAGNOSIS — O0933 Supervision of pregnancy with insufficient antenatal care, third trimester: Secondary | ICD-10-CM

## 2019-11-03 DIAGNOSIS — O283 Abnormal ultrasonic finding on antenatal screening of mother: Secondary | ICD-10-CM

## 2019-11-03 DIAGNOSIS — Z362 Encounter for other antenatal screening follow-up: Secondary | ICD-10-CM | POA: Diagnosis not present

## 2019-11-03 DIAGNOSIS — O3413 Maternal care for benign tumor of corpus uteri, third trimester: Secondary | ICD-10-CM

## 2019-11-04 ENCOUNTER — Other Ambulatory Visit (HOSPITAL_COMMUNITY): Payer: Self-pay | Admitting: *Deleted

## 2019-11-04 DIAGNOSIS — Z362 Encounter for other antenatal screening follow-up: Secondary | ICD-10-CM

## 2019-11-10 ENCOUNTER — Other Ambulatory Visit: Payer: Self-pay

## 2019-11-10 ENCOUNTER — Ambulatory Visit (INDEPENDENT_AMBULATORY_CARE_PROVIDER_SITE_OTHER): Payer: Medicare Other | Admitting: Obstetrics and Gynecology

## 2019-11-10 ENCOUNTER — Encounter: Payer: Self-pay | Admitting: Obstetrics and Gynecology

## 2019-11-10 VITALS — BP 125/80 | HR 98

## 2019-11-10 DIAGNOSIS — E669 Obesity, unspecified: Secondary | ICD-10-CM

## 2019-11-10 DIAGNOSIS — O09523 Supervision of elderly multigravida, third trimester: Secondary | ICD-10-CM

## 2019-11-10 DIAGNOSIS — R7303 Prediabetes: Secondary | ICD-10-CM

## 2019-11-10 DIAGNOSIS — O34219 Maternal care for unspecified type scar from previous cesarean delivery: Secondary | ICD-10-CM

## 2019-11-10 DIAGNOSIS — O35EXX0 Maternal care for other (suspected) fetal abnormality and damage, fetal genitourinary anomalies, not applicable or unspecified: Secondary | ICD-10-CM

## 2019-11-10 DIAGNOSIS — O99213 Obesity complicating pregnancy, third trimester: Secondary | ICD-10-CM

## 2019-11-10 DIAGNOSIS — O09529 Supervision of elderly multigravida, unspecified trimester: Secondary | ICD-10-CM

## 2019-11-10 DIAGNOSIS — Z3A32 32 weeks gestation of pregnancy: Secondary | ICD-10-CM

## 2019-11-10 DIAGNOSIS — Z3480 Encounter for supervision of other normal pregnancy, unspecified trimester: Secondary | ICD-10-CM

## 2019-11-10 DIAGNOSIS — O358XX0 Maternal care for other (suspected) fetal abnormality and damage, not applicable or unspecified: Secondary | ICD-10-CM

## 2019-11-10 MED ORDER — COMFORT FIT MATERNITY SUPP LG MISC: 0 refills | Status: DC

## 2019-11-10 NOTE — Progress Notes (Signed)
   PRENATAL VISIT NOTE  Subjective:  Alexandria Boyle is a 44 y.o. G3P1011 at [redacted]w[redacted]d being seen today for ongoing prenatal care.  She is currently monitored for the following issues for this high-risk pregnancy and has Syncope and collapse; Chronic back pain; Anemia, iron deficiency; Fibromyalgia; Numbness; Chronic pain syndrome; Uterine fibroid; Chronic midline low back pain with bilateral sciatica; Anxiety; Pelvic pain affecting pregnancy; No prenatal care in current pregnancy in second trimester; Supervision of other normal pregnancy, antepartum; AMA, Age>40; Fetal renal anomaly, single gestation; Obesity affecting pregnancy; and History of cesarean section complicating pregnancy on their problem list.  Patient reports hip pain and insomnia.  Contractions: Not present. Vag. Bleeding: None.  Movement: Present. Denies leaking of fluid.   The following portions of the patient's history were reviewed and updated as appropriate: allergies, current medications, past family history, past medical history, past social history, past surgical history and problem list.   Objective:   Vitals:   11/10/19 1600  BP: 125/80  Pulse: 98    Fetal Status: Fetal Heart Rate (bpm): 142   Movement: Present     General:  Alert, oriented and cooperative. Patient is in no acute distress.  Skin: Skin is warm and dry. No rash noted.   Cardiovascular: Normal heart rate noted  Respiratory: Normal respiratory effort, no problems with respiration noted  Abdomen: Soft, gravid, appropriate for gestational age.  Pain/Pressure: Present     Pelvic: Cervical exam deferred        Extremities: Normal range of motion.  Edema: Trace  Mental Status: Normal mood and affect. Normal behavior. Normal judgment and thought content.   Assessment and Plan:  Pregnancy: G3P1011 at [redacted]w[redacted]d 1. Supervision of other normal pregnancy, antepartum Patient is doing well Rx maternity support belt provided Rx ambien given Patient declined  glucola and monitoring CBGs. She reluctantly agreed to blood work today Patient desires BTL- papers signed today as patient reports having medicaid as secondary  2. AMA, Age>40 FOllow up ultrasound  3. History of cesarean section complicating pregnancy Previous c-section due to NRFHT. Patient desires TOLAC. Consent signed  4. Obesity affecting pregnancy in third trimester   5. Fetal renal anomaly, single gestation Follow up ultrasound  Preterm labor symptoms and general obstetric precautions including but not limited to vaginal bleeding, contractions, leaking of fluid and fetal movement were reviewed in detail with the patient. Please refer to After Visit Summary for other counseling recommendations.   Return in about 2 weeks (around 11/24/2019) for in person, ROB, High risk.  Future Appointments  Date Time Provider [place]  12/06/2019  2:00 PM [place] [facility]  12/06/2019  2:00 PM Felida [place] 3 [facility]    Mora Bellman, MD

## 2019-11-10 NOTE — Progress Notes (Signed)
HOB, c/o back, hip nerve pain, nausea, insomnia x 2 weeks.

## 2019-11-11 LAB — COMPREHENSIVE METABOLIC PANEL
ALT: 11 IU/L (ref 0–32)
AST: 16 IU/L (ref 0–40)
Albumin/Globulin Ratio: 1 — ABNORMAL LOW (ref 1.2–2.2)
Albumin: 3.3 g/dL — ABNORMAL LOW (ref 3.8–4.8)
Alkaline Phosphatase: 112 IU/L (ref 39–117)
BUN/Creatinine Ratio: 16 (ref 9–23)
BUN: 7 mg/dL (ref 6–24)
Bilirubin Total: <0.2 mg/dL (ref 0.0–1.2)
CO2: 20 mmol/L (ref 20–29)
Calcium: 8.7 mg/dL (ref 8.7–10.2)
Chloride: 104 mmol/L (ref 96–106)
Creatinine, Ser: 0.43 mg/dL — ABNORMAL LOW (ref 0.57–1.00)
GFR calc Af Amer: 146 mL/min/{1.73_m2} (ref >59)
GFR calc non Af Amer: 127 mL/min/{1.73_m2} (ref >59)
Globulin, Total: 3.2 g/dL (ref 1.5–4.5)
Glucose: 103 mg/dL — ABNORMAL HIGH (ref 65–99)
Potassium: 3.9 mmol/L (ref 3.5–5.2)
Sodium: 137 mmol/L (ref 134–144)
Total Protein: 6.5 g/dL (ref 6.0–8.5)

## 2019-11-11 LAB — CBC
Hematocrit: 34.8 % (ref 34.0–46.6)
Hemoglobin: 11.4 g/dL (ref 11.1–15.9)
MCH: 27.8 pg (ref 26.6–33.0)
MCHC: 32.8 g/dL (ref 31.5–35.7)
MCV: 85 fL (ref 79–97)
Platelets: 206 10*3/uL (ref 150–450)
RBC: 4.1 x10E6/uL (ref 3.77–5.28)
RDW: 14 % (ref 11.7–15.4)
WBC: 8.8 10*3/uL (ref 3.4–10.8)

## 2019-11-11 LAB — PROTEIN / CREATININE RATIO, URINE
Creatinine, Urine: 38.8 mg/dL
Protein, Ur: 5.4 mg/dL
Protein/Creat Ratio: 139 mg/g creat (ref 0–200)

## 2019-11-11 LAB — HEMOGLOBIN A1C
Est. average glucose Bld gHb Est-mCnc: 126 mg/dL
Hgb A1c MFr Bld: 6 % — ABNORMAL HIGH (ref 4.8–5.6)

## 2019-11-12 NOTE — Addendum Note (Signed)
Addended by: Mora Bellman on: 11/12/2019 08:41 AM   Modules accepted: Orders

## 2019-11-17 ENCOUNTER — Telehealth: Payer: Self-pay

## 2019-11-17 NOTE — Telephone Encounter (Signed)
S/w patient and advised of results and provider recommendations. Advised pt of upcoming referral appointments and pt stated that she does not think that she is going to do the glucose test, advised to discuss with provider at appt on 11-23-19.

## 2019-11-23 ENCOUNTER — Encounter: Payer: Self-pay | Admitting: Obstetrics and Gynecology

## 2019-11-23 ENCOUNTER — Other Ambulatory Visit: Payer: Self-pay

## 2019-11-23 ENCOUNTER — Telehealth (INDEPENDENT_AMBULATORY_CARE_PROVIDER_SITE_OTHER): Payer: Medicare Other | Admitting: Obstetrics and Gynecology

## 2019-11-23 VITALS — BP 138/80 | HR 81

## 2019-11-23 DIAGNOSIS — Z3A34 34 weeks gestation of pregnancy: Secondary | ICD-10-CM

## 2019-11-23 DIAGNOSIS — O09529 Supervision of elderly multigravida, unspecified trimester: Secondary | ICD-10-CM

## 2019-11-23 DIAGNOSIS — Z3480 Encounter for supervision of other normal pregnancy, unspecified trimester: Secondary | ICD-10-CM

## 2019-11-23 DIAGNOSIS — O34219 Maternal care for unspecified type scar from previous cesarean delivery: Secondary | ICD-10-CM

## 2019-11-23 DIAGNOSIS — E669 Obesity, unspecified: Secondary | ICD-10-CM

## 2019-11-23 DIAGNOSIS — O09523 Supervision of elderly multigravida, third trimester: Secondary | ICD-10-CM

## 2019-11-23 DIAGNOSIS — O99213 Obesity complicating pregnancy, third trimester: Secondary | ICD-10-CM

## 2019-11-23 MED ORDER — ZOLPIDEM TARTRATE 5 MG PO TABS: 5.0000 mg | ORAL_TABLET | Freq: Every evening | ORAL | 1 refills | Status: DC | PRN

## 2019-11-23 NOTE — Progress Notes (Signed)
Pt is having increase in ctx.  Pt states she is not able to sleep.  Pt states she is starting to have HA as well.

## 2019-11-23 NOTE — Progress Notes (Signed)
OBSTETRICS PRENATAL VIRTUAL VISIT ENCOUNTER NOTE  Provider location: [facility] [place]   I connected with [name] on 11/23/19 at  1:15 PM EDT by MyChart Video Encounter at home and verified that I am speaking with the correct person using two identifiers.   I discussed the limitations, risks, security and privacy concerns of performing an evaluation and management service virtually and the availability of in person appointments. I also discussed with the patient that there may be a patient responsible charge related to this service. The patient expressed understanding and agreed to proceed. Subjective:  Alexandria Boyle is a 44 y.o. G3P1011 at [redacted]w[redacted]d being seen today for ongoing prenatal care.  She is currently monitored for the following issues for this high-risk pregnancy and has Syncope and collapse; Chronic back pain; Anemia, iron deficiency; Fibromyalgia; Numbness; Chronic pain syndrome; Uterine fibroid; Chronic midline low back pain with bilateral sciatica; Anxiety; Pelvic pain affecting pregnancy; No prenatal care in current pregnancy in second trimester; Supervision of other normal pregnancy, antepartum; AMA, Age>40; Fetal renal anomaly, single gestation; Obesity affecting pregnancy; and History of cesarean section complicating pregnancy on their problem list.  Patient reports irregular contractions and pelvic pressure.  Contractions: Irregular. Vag. Bleeding: None.  Movement: Present. Denies any leaking of fluid.   The following portions of the patient's history were reviewed and updated as appropriate: allergies, current medications, past family history, past medical history, past social history, past surgical history and problem list.   Objective:   Vitals:   11/23/19 1350  BP: 138/80  Pulse: 81    Fetal Status:     Movement: Present     General:  Alert, oriented and cooperative. Patient is in no acute distress.  Respiratory: Normal respiratory  effort, no problems with respiration noted  Mental Status: Normal mood and affect. Normal behavior. Normal judgment and thought content.  Rest of physical exam deferred due to type of encounter  Imaging: [place] MFM OB FOLLOW UP  Result Date: 11/03/2019 ----------------------------------------------------------------------  OBSTETRICS REPORT                       (Signed Final 11/03/2019 04:11 pm) ---------------------------------------------------------------------- Patient Info  ID #:       [id number]                          D.O.B.:  November 11, 1975 (43 yrs)  Name:       Alexandria Boyle                Visit Date: 11/03/2019 03:14 pm ---------------------------------------------------------------------- Performed By  Performed By:     Jeanene Erb BS,      Ref. Address:     [address]  [place], [place]  Attending:        Sander Nephew      [facility]  Referred By:      Laury Deep CNM ---------------------------------------------------------------------- Orders   #  Description                          Code         Ordered By   1  [place] MFM OB FOLLOW UP                  [id number]     [place]  ----------------------------------------------------------------------   #  Order #                    Accession #                 Episode #   1  [id number]                  [id number]                  [id number]  ---------------------------------------------------------------------- Indications   Previous cesarean delivery, antepartum         O34.219   Fetal abnormality - other known or             O35.9XX0   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)   Late to prenatal care, third trimester         O09.33    Advanced maternal age multigravida 51+,        O55.523   third trimester   Marginal insertion of umbilical cord affecting O43.193   management of mother in third trimester   Uterine fibroids affecting pregnancy in third  O91.13, D25.9   trimester, antepartum   Encounter for other antenatal screening        Z36.2   follow-up   [redacted] weeks gestation of pregnancy                Z3A.31  ---------------------------------------------------------------------- Fetal Evaluation  Num Of Fetuses:         1  Fetal Heart Rate(bpm):  136  Cardiac Activity:       Observed  Presentation:           Cephalic  Placenta:               Posterior  P. Cord Insertion:      Marginal insertion prev.  Amniotic Fluid  AFI FV:      Within normal limits  AFI Sum(cm)     %Tile  Largest Pocket(cm)  9.23            9           3.12  RUQ(cm)       RLQ(cm)       LUQ(cm)        LLQ(cm)  2.82          0.65          3.12           2.64 ---------------------------------------------------------------------- Biometry  BPD:      84.9  mm     G. Age:  34w 1d         98  %    CI:        82.48   %    70 - 86                                                          FL/HC:      20.9   %    19.3 - 21.3  HC:      294.9  mm     G. Age:  32w 4d         53  %    HC/AC:      1.06        0.96 - 1.17  AC:      277.3  mm     G. Age:  31w 6d         66  %    FL/BPD:     72.7   %    71 - 87  FL:       61.7  mm     G. Age:  32w 0d         60  %    FL/AC:      22.3   %    20 - 24  Est. FW:    1909  gm      4 lb 3 oz     72  % ---------------------------------------------------------------------- OB History  Gravidity:    3         Term:   1         SAB:   1  Living:       1 ---------------------------------------------------------------------- Gestational Age  Clinical EDD:  32w 1d                                        EDD:   12/28/19  U/S Today:     32w 5d                                        EDD:   12/24/19  Best:          31w 1d     Det. ByLoman Chroman          EDD:   01/04/20                                      (  05/24/19) ---------------------------------------------------------------------- Anatomy  Cranium:               Appears normal         LVOT:                   Previously seen  Cavum:                 Previously seen        Aortic Arch:            Not well visualized  Ventricles:            Appears normal         Ductal Arch:            Not well visualized  Choroid Plexus:        Previously seen        Diaphragm:              Appears normal  Cerebellum:            Previously seen        Stomach:                Appears normal, left                                                                        sided  Posterior Fossa:       Previously seen        Abdomen:                Appears normal  Nuchal Fold:           Not applicable ([id number]    Abdominal Wall:         Previously seen                         wks GA)  Face:                  Orbits and profile     Cord Vessels:           Previously seen                         previously seen  Lips:                  Not well visualized    Kidneys:                Crossed renal                                                                        ectopia  Palate:                Appears normal         Bladder:                Appears normal  Thoracic:              Appears normal         Spine:                  Previously seen  Heart:                 Previously seen        Upper Extremities:      Visualized  RVOT:                  Previously seen        Lower Extremities:      Visualized  Other:  Nasal bone visualized. Heels visualized. Technically difficult due to          advanced gestational age. ---------------------------------------------------------------------- Cervix Uterus Adnexa  Cervix  Not visualized (advanced GA >24wks) ---------------------------------------------------------------------- Impression  Follow up growth, there in normal interval growth and  amniotic fluid.  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 This is not a horseshoe  kidney. She was previously counseled regarding the  associated aneuploidy risk. She was offered an  amniocentesis but declined. I reviewed today's examination  with Ms. Gibbins. 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  I reviewed the images in detail and discussed invasive  testing, she declined further testing at this time.  I did not observe the splenic cyst as seen in the prior exam. ---------------------------------------------------------------------- Recommendations  Follow up growth scheduled in 4 weeks. ----------------------------------------------------------------------               Sander Nephew, MD Electronically Signed Final Report   11/03/2019 04:11 pm ----------------------------------------------------------------------   Assessment and Plan:  Pregnancy: [id number] at [redacted]w[redacted]d 1. History of cesarean section complicating pregnancy Patient desires TOLAC  2. Supervision of other normal pregnancy, antepartum Patient is doing well reporting irregular contractions Patient missed nutrition appointment but agrees to have it rescheduled Patient declines 2 hour glucola again  3. Obesity affecting pregnancy in third trimester Continue ASA  4. AMA, Age>40   Preterm labor symptoms and general obstetric precautions including but not limited to vaginal bleeding, contractions, leaking of fluid and fetal movement were reviewed in detail with the patient. I discussed the assessment and treatment plan with the patient. The patient was provided an opportunity to ask questions and all were answered. The patient agreed with the plan and demonstrated an understanding of the instructions. The patient was advised to call back or seek an in-person office evaluation/go to [facility] at [facility] for any urgent or concerning  symptoms. Please refer to After Visit Summary for other counseling recommendations.   I provided 11 minutes of face-to-face time during this encounter.  No follow-ups on file.  Future Appointments  Date Time Provider [place]  11/25/2019  8:45 AM [facility] None  11/25/2019  9:15 AM Gavin Pound, CNM [facility] None  12/06/2019  2:00 PM [place] NURSE [place] [facility]  12/06/2019  2:00 PM [place] 3 [facility]    Mora Bellman, MD [facility] [place]

## 2019-11-25 ENCOUNTER — Other Ambulatory Visit: Payer: Medicare Other

## 2019-12-06 ENCOUNTER — Ambulatory Visit: Payer: Medicare Other

## 2019-12-07 ENCOUNTER — Encounter: Payer: Medicare Other | Admitting: Family Medicine

## 2019-12-13 ENCOUNTER — Other Ambulatory Visit: Payer: Self-pay

## 2019-12-13 ENCOUNTER — Inpatient Hospital Stay (EMERGENCY_DEPARTMENT_HOSPITAL)
Admission: AD | Admit: 2019-12-13 | Discharge: 2019-12-13 | Disposition: A | Discharge status: Home / Self Care | Payer: Medicare Other | Source: Home / Self Care | Attending: Obstetrics & Gynecology | Admitting: Obstetrics & Gynecology

## 2019-12-13 DIAGNOSIS — O4703 False labor before 37 completed weeks of gestation, third trimester: Secondary | ICD-10-CM

## 2019-12-13 DIAGNOSIS — Z3A36 36 weeks gestation of pregnancy: Secondary | ICD-10-CM | POA: Diagnosis not present

## 2019-12-13 DIAGNOSIS — O99213 Obesity complicating pregnancy, third trimester: Secondary | ICD-10-CM

## 2019-12-13 DIAGNOSIS — O26893 Other specified pregnancy related conditions, third trimester: Secondary | ICD-10-CM | POA: Insufficient documentation

## 2019-12-13 DIAGNOSIS — O26899 Other specified pregnancy related conditions, unspecified trimester: Secondary | ICD-10-CM

## 2019-12-13 DIAGNOSIS — R102 Pelvic and perineal pain: Secondary | ICD-10-CM

## 2019-12-13 DIAGNOSIS — O0933 Supervision of pregnancy with insufficient antenatal care, third trimester: Secondary | ICD-10-CM | POA: Insufficient documentation

## 2019-12-13 DIAGNOSIS — O134 Gestational [pregnancy-induced] hypertension without significant proteinuria, complicating childbirth: Secondary | ICD-10-CM | POA: Diagnosis not present

## 2019-12-13 DIAGNOSIS — Z7982 Long term (current) use of aspirin: Secondary | ICD-10-CM | POA: Insufficient documentation

## 2019-12-13 DIAGNOSIS — O479 False labor, unspecified: Secondary | ICD-10-CM

## 2019-12-13 DIAGNOSIS — E669 Obesity, unspecified: Secondary | ICD-10-CM | POA: Insufficient documentation

## 2019-12-13 DIAGNOSIS — Z3480 Encounter for supervision of other normal pregnancy, unspecified trimester: Secondary | ICD-10-CM

## 2019-12-13 DIAGNOSIS — O0932 Supervision of pregnancy with insufficient antenatal care, second trimester: Secondary | ICD-10-CM

## 2019-12-13 DIAGNOSIS — O34219 Maternal care for unspecified type scar from previous cesarean delivery: Secondary | ICD-10-CM

## 2019-12-13 NOTE — MAU Provider Note (Signed)
Chief Complaint:  Labor Eval   First Provider Initiated Contact with Patient 12/13/19 0532      HPI: Steven L Bentsen is a 44 y.o. G3P1011 at 56w6dwho presents to maternity admissions reporting that she lost her mucous plug. She has had intermittent abdominal tightening but denies other concerns.  She reports good fetal movement that has been more than normal during time in MAU. Denies LOF, vaginal bleeding, vaginal itching/burning, urinary symptoms, h/a, dizziness, n/v, or fever/chills.    Past Medical History: Past Medical History:  Diagnosis Date  . Allergy   . Arthritis   . Back disorder   . Complex regional pain syndrome   . CRPS (complex regional pain syndrome type I)   . CRPS (complex regional pain syndrome type I)   . Depression   . Fibromyalgia   . Paresthesia 07/13/2015  . Syncope and collapse 07/13/2015    Past obstetric history: OB History  Gravida Para Term Preterm AB Living  _0 0 1 1  SAB TAB Ectopic Multiple Live Births  1 0 0 0 1    # Outcome Date GA Lbr Len/2nd Weight Sex Delivery Anes PTL Lv  3 Current           2 Term 11/30/96 [id number] g F CS-Unspec        Complications: Fetal Intolerance  1 SAB             Past Surgical History: Past Surgical History:  Procedure Laterality Date  . CESAREAN SECTION      Family History: Family History  Problem Relation Age of Onset  . Diabetes Mother   . Cancer Father   . Cancer Maternal Grandfather   . Diabetes Maternal Grandfather     Social History: Social History   Tobacco Use  . Smoking status: Never Smoker  . Smokeless tobacco: Never Used  Substance Use Topics  . Alcohol use: No  . Drug use: No    Allergies:  Allergies  Allergen Reactions  . Bee Pollen Swelling  . Tomato Itching and Swelling  . Citrus Rash  . Lyrica [Pregabalin] Rash  . Neurontin [Gabapentin] Swelling and Rash  . Strawberry Extract Itching and Rash    Meds:  Medications Prior to Admission  Medication Sig Dispense  Refill Last Dose  . cetirizine HCl (ZYRTEC) 5 MG/5ML SOLN Take 10 mLs (10 mg total) by mouth daily. 300 mL 3 Past Week at Unknown time  . ferrous sulfate 325 (65 FE) MG tablet Take 1 tablet (325 mg total) by mouth 3 (three) times daily with meals. 90 tablet 3 12/12/2019 at Unknown time  . Prenatal Vit-Fe Fumarate-FA (PRENATAL MULTIVITAMIN) TABS tablet Take 1 tablet by mouth daily at 12 noon.   12/12/2019 at Unknown time  . acetaminophen (TYLENOL) 325 MG tablet Take 2 tablets (650 mg total) by mouth every 4 (four) hours as needed. 100 tablet 2   . Ascorbic Acid (VITAMIN C PO) Take 1 tablet by mouth daily.     . Marland Kitchenspirin 81 MG tablet Take 1 tablet (81 mg total) by mouth daily. 90 tablet 4   . Blood Pressure Monitoring (BLOOD PRESSURE KIT) DEVI 1 kit by Does not apply route once a week. Check Blood Pressure regularly and record readings into the Babyscripts App.  Large Cuff.  DX O90.0 1 each 0   . capsaicin (ZOSTRIX) 0.025 % cream Apply topically 2 (two) times daily.     . Regino Schultzeandages & Supports (COMFORT FIT  MATERNITY SUPP LG) MISC Wear daily when ambulating 1 each 0   . Elastic Bandages & Supports (COMFORT FIT MATERNITY SUPP SM) MISC 1 Units by Does not apply route daily as needed. 1 each 0   . pantoprazole (PROTONIX) 40 MG tablet Take 1 tablet (40 mg total) by mouth daily. (Patient not taking: Reported on 11/03/2019) 30 tablet 3   . zolpidem (AMBIEN) 5 MG tablet Take 1 tablet (5 mg total) by mouth at bedtime as needed for sleep. 20 tablet 1     ROS:  Review of Systems All other systems negative unless noted above in HPI.   I have reviewed patient's Past Medical Hx, Surgical Hx, Family Hx, Social Hx, medications and allergies.   Physical Exam   Patient Vitals for the past 24 hrs:  BP Temp Temp src Pulse Resp Height Weight  12/13/19 0355 124/76 -- -- 93 -- -- --  12/13/19 0354 -- 98.8 F (37.1 C) Oral -- 16 -- --  12/13/19 0346 -- -- -- -- -- 5' (1.524 m) 88.9 kg   Constitutional:  Well-developed, well-nourished female in no acute distress.  Cardiovascular: normal rate Respiratory: normal effort GI: Abd soft, gravid appropriate for gestational age.  MS: Extremities nontender, no edema, normal ROM Neurologic: Alert and oriented x 4.   Dilation: Closed Effacement (%): Thick Cervical Position: Middle Station: -3 Presentation: Undeterminable Exam by:: benji Production designer, theatre/television/film  FHT:  Baseline 145, moderate variability, accelerations present, two late decelerations at beginning of tracing that resolved with position changes Contractions: q 5 mins   Labs: No results found for this or any previous visit (from the past 24 hour(s)). A/Positive/-- (02/17 1605)  Imaging:  No results found.  MAU Course/MDM: Orders Placed This Encounter  Procedures  . Discharge patient    No orders of the defined types were placed in this encounter.    NST reviewed: Reactive. Two late decels as noted above but resolved with position changes and FHT Cat I for > 1 hour thereafter.  Patient felt two times of abdominal tightening during stay otherwise not feeling ctx noted on TOCO. Hx C-section; desires TOLAC. Pt discharged with strict return precautions. Appt in two days as below.  Future Appointments  Date Time Provider [place]  12/15/2019  3:00 PM Sloan Leiter, MD [facility] None    Assessment: 1. False labor   2. Obesity affecting pregnancy in third trimester   3. History of cesarean section complicating pregnancy   4. Supervision of other normal pregnancy, antepartum   5. Pelvic pain affecting pregnancy, antepartum   6. No prenatal care in current pregnancy in second trimester     Plan: Discharge home Labor precautions and fetal kick counts  Allergies as of 12/13/2019      Reactions   Bee Pollen Swelling   Tomato Itching, Swelling   Citrus Rash   Lyrica [pregabalin] Rash   Neurontin [gabapentin] Swelling, Rash   Strawberry Extract Itching, Rash      Medication  List    STOP taking these medications   pantoprazole 40 MG tablet Commonly known as: Protonix     TAKE these medications   acetaminophen 325 MG tablet Commonly known as: Tylenol Take 2 tablets (650 mg total) by mouth every 4 (four) hours as needed.   aspirin 81 MG tablet Take 1 tablet (81 mg total) by mouth daily.   Blood Pressure Kit Devi 1 kit by Does not apply route once a week. Check Blood Pressure regularly and record  readings into the Babyscripts App.  Large Cuff.  DX O90.0   capsaicin 0.025 % cream Commonly known as: ZOSTRIX Apply topically 2 (two) times daily.   cetirizine HCl 5 MG/5ML Soln Commonly known as: Zyrtec Take 10 mLs (10 mg total) by mouth daily.   Comfort Fit Maternity Supp Sm Misc 1 Units by Does not apply route daily as needed.   Comfort Fit Maternity Supp Lg Misc Wear daily when ambulating   ferrous sulfate 325 (65 FE) MG tablet Take 1 tablet (325 mg total) by mouth 3 (three) times daily with meals.   prenatal multivitamin Tabs tablet Take 1 tablet by mouth daily at 12 noon.   VITAMIN C PO Take 1 tablet by mouth daily.   zolpidem 5 MG tablet Commonly known as: AMBIEN Take 1 tablet (5 mg total) by mouth at bedtime as needed for sleep.       Barrington Ellison, MD [facility] Family Medicine Fellow, [facility] for [facility] 12/13/2019 5:36 AM

## 2019-12-13 NOTE — MAU Note (Addendum)
Lost mucus plug 30 min ago, brown mucus.  No leaking.  No bleeding. Baby moving well. Tighnings in abd area on and off for 5 days.

## 2019-12-13 NOTE — Discharge Instructions (Signed)

## 2019-12-15 ENCOUNTER — Other Ambulatory Visit: Payer: Self-pay

## 2019-12-15 ENCOUNTER — Encounter: Payer: Self-pay | Admitting: Obstetrics and Gynecology

## 2019-12-15 ENCOUNTER — Inpatient Hospital Stay (HOSPITAL_COMMUNITY)
Admission: AD | Admit: 2019-12-15 | Discharge: 2019-12-20 | DRG: 785 | Disposition: A | Discharge status: Home / Self Care | Payer: Medicare Other | Attending: Obstetrics and Gynecology | Admitting: Obstetrics and Gynecology

## 2019-12-15 ENCOUNTER — Encounter (HOSPITAL_COMMUNITY): Payer: Self-pay | Admitting: Obstetrics and Gynecology

## 2019-12-15 ENCOUNTER — Encounter: Payer: Medicare Other | Admitting: Obstetrics and Gynecology

## 2019-12-15 ENCOUNTER — Ambulatory Visit (INDEPENDENT_AMBULATORY_CARE_PROVIDER_SITE_OTHER): Payer: Medicare Other | Admitting: Obstetrics and Gynecology

## 2019-12-15 ENCOUNTER — Other Ambulatory Visit (HOSPITAL_COMMUNITY)
Admission: RE | Admit: 2019-12-15 | Discharge: 2019-12-15 | Disposition: A | Discharge status: Home / Self Care | Payer: Medicare Other | Source: Ambulatory Visit | Attending: Family Medicine | Admitting: Family Medicine

## 2019-12-15 VITALS — BP 132/90 | HR 94 | Wt 199.5 lb

## 2019-12-15 DIAGNOSIS — R102 Pelvic and perineal pain: Secondary | ICD-10-CM

## 2019-12-15 DIAGNOSIS — Z98891 History of uterine scar from previous surgery: Secondary | ICD-10-CM

## 2019-12-15 DIAGNOSIS — D509 Iron deficiency anemia, unspecified: Secondary | ICD-10-CM | POA: Diagnosis present

## 2019-12-15 DIAGNOSIS — O34219 Maternal care for unspecified type scar from previous cesarean delivery: Secondary | ICD-10-CM

## 2019-12-15 DIAGNOSIS — O0932 Supervision of pregnancy with insufficient antenatal care, second trimester: Secondary | ICD-10-CM

## 2019-12-15 DIAGNOSIS — O134 Gestational [pregnancy-induced] hypertension without significant proteinuria, complicating childbirth: Principal | ICD-10-CM | POA: Diagnosis present

## 2019-12-15 DIAGNOSIS — Z20822 Contact with and (suspected) exposure to covid-19: Secondary | ICD-10-CM | POA: Diagnosis present

## 2019-12-15 DIAGNOSIS — O358XX0 Maternal care for other (suspected) fetal abnormality and damage, not applicable or unspecified: Secondary | ICD-10-CM | POA: Diagnosis present

## 2019-12-15 DIAGNOSIS — O9902 Anemia complicating childbirth: Secondary | ICD-10-CM | POA: Diagnosis present

## 2019-12-15 DIAGNOSIS — E669 Obesity, unspecified: Secondary | ICD-10-CM | POA: Diagnosis present

## 2019-12-15 DIAGNOSIS — Z3A37 37 weeks gestation of pregnancy: Secondary | ICD-10-CM | POA: Diagnosis not present

## 2019-12-15 DIAGNOSIS — Z302 Encounter for sterilization: Secondary | ICD-10-CM | POA: Diagnosis not present

## 2019-12-15 DIAGNOSIS — D252 Subserosal leiomyoma of uterus: Secondary | ICD-10-CM | POA: Diagnosis present

## 2019-12-15 DIAGNOSIS — O133 Gestational [pregnancy-induced] hypertension without significant proteinuria, third trimester: Secondary | ICD-10-CM | POA: Diagnosis present

## 2019-12-15 DIAGNOSIS — Z3480 Encounter for supervision of other normal pregnancy, unspecified trimester: Secondary | ICD-10-CM

## 2019-12-15 DIAGNOSIS — O35EXX0 Maternal care for other (suspected) fetal abnormality and damage, fetal genitourinary anomalies, not applicable or unspecified: Secondary | ICD-10-CM

## 2019-12-15 DIAGNOSIS — O3413 Maternal care for benign tumor of corpus uteri, third trimester: Secondary | ICD-10-CM | POA: Diagnosis present

## 2019-12-15 DIAGNOSIS — R030 Elevated blood-pressure reading, without diagnosis of hypertension: Secondary | ICD-10-CM

## 2019-12-15 DIAGNOSIS — O09529 Supervision of elderly multigravida, unspecified trimester: Secondary | ICD-10-CM

## 2019-12-15 DIAGNOSIS — D259 Leiomyoma of uterus, unspecified: Secondary | ICD-10-CM | POA: Diagnosis present

## 2019-12-15 DIAGNOSIS — O99214 Obesity complicating childbirth: Secondary | ICD-10-CM | POA: Diagnosis present

## 2019-12-15 DIAGNOSIS — O34211 Maternal care for low transverse scar from previous cesarean delivery: Secondary | ICD-10-CM | POA: Diagnosis present

## 2019-12-15 DIAGNOSIS — O99213 Obesity complicating pregnancy, third trimester: Secondary | ICD-10-CM

## 2019-12-15 DIAGNOSIS — O99210 Obesity complicating pregnancy, unspecified trimester: Secondary | ICD-10-CM | POA: Diagnosis present

## 2019-12-15 LAB — COMPREHENSIVE METABOLIC PANEL
ALT: 18 U/L (ref 0–44)
AST: 18 U/L (ref 15–41)
Albumin: 2.7 g/dL — ABNORMAL LOW (ref 3.5–5.0)
Alkaline Phosphatase: 115 U/L (ref 38–126)
Anion gap: 9 (ref 5–15)
BUN: 9 mg/dL (ref 6–20)
CO2: 19 mmol/L — ABNORMAL LOW (ref 22–32)
Calcium: 8.7 mg/dL — ABNORMAL LOW (ref 8.9–10.3)
Chloride: 106 mmol/L (ref 98–111)
Creatinine, Ser: 0.63 mg/dL (ref 0.44–1.00)
GFR calc Af Amer: >60 mL/min (ref >60)
GFR calc non Af Amer: >60 mL/min (ref >60)
Glucose, Bld: 128 mg/dL — ABNORMAL HIGH (ref 70–99)
Potassium: 3.7 mmol/L (ref 3.5–5.1)
Sodium: 134 mmol/L — ABNORMAL LOW (ref 135–145)
Total Bilirubin: 0.8 mg/dL (ref 0.3–1.2)
Total Protein: 6.6 g/dL (ref 6.5–8.1)

## 2019-12-15 LAB — URINALYSIS, ROUTINE W REFLEX MICROSCOPIC
Bilirubin Urine: NEGATIVE
Glucose, UA: NEGATIVE mg/dL
Ketones, ur: 5 mg/dL — AB
Leukocytes,Ua: NEGATIVE
Nitrite: NEGATIVE
Protein, ur: NEGATIVE mg/dL
Specific Gravity, Urine: 1.016 (ref 1.005–1.030)
pH: 5 (ref 5.0–8.0)

## 2019-12-15 LAB — CBC
HCT: 36.4 % (ref 36.0–46.0)
Hemoglobin: 11.6 g/dL — ABNORMAL LOW (ref 12.0–15.0)
MCH: 26.7 pg (ref 26.0–34.0)
MCHC: 31.9 g/dL (ref 30.0–36.0)
MCV: 83.9 fL (ref 80.0–100.0)
Platelets: 186 10*3/uL (ref 150–400)
RBC: 4.34 MIL/uL (ref 3.87–5.11)
RDW: 15 % (ref 11.5–15.5)
WBC: 8.4 10*3/uL (ref 4.0–10.5)
nRBC: 0 % (ref 0.0–0.2)

## 2019-12-15 LAB — PROTEIN / CREATININE RATIO, URINE
Creatinine, Urine: 87.94 mg/dL
Protein Creatinine Ratio: 0.1 mg/mg{Cre} (ref 0.00–0.15)
Total Protein, Urine: 9 mg/dL

## 2019-12-15 LAB — TYPE AND SCREEN
ABO/RH(D): A POS
Antibody Screen: NEGATIVE

## 2019-12-15 LAB — ABO/RH: ABO/RH(D): A POS

## 2019-12-15 MED ORDER — LIDOCAINE HCL (PF) 1 % IJ SOLN
30.0000 mL | INTRAMUSCULAR | Status: AC | PRN
  Administered 2019-12-16: 2 mL via SUBCUTANEOUS
  Administered 2019-12-16: 5 mL via SUBCUTANEOUS
  Administered 2019-12-16: 3 mL via SUBCUTANEOUS

## 2019-12-15 MED ORDER — LACTATED RINGERS IV SOLN
500.0000 mL | INTRAVENOUS | Status: DC | PRN
  Administered 2019-12-15: 500 mL via INTRAVENOUS
  Administered 2019-12-16: 1000 mL via INTRAVENOUS
  Administered 2019-12-16: 500 mL via INTRAVENOUS

## 2019-12-15 MED ORDER — LACTATED RINGERS IV SOLN: INTRAVENOUS | Status: DC

## 2019-12-15 MED ORDER — OXYTOCIN 40 UNITS IN NORMAL SALINE INFUSION - SIMPLE MED
2.5000 [IU]/h | INTRAVENOUS | Status: DC
  Filled 2019-12-15: qty 1000

## 2019-12-15 MED ORDER — ACETAMINOPHEN 325 MG PO TABS: 650.0000 mg | ORAL_TABLET | ORAL | Status: DC | PRN

## 2019-12-15 MED ORDER — OXYTOCIN BOLUS FROM INFUSION: 500.0000 mL | Freq: Once | INTRAVENOUS | Status: DC

## 2019-12-15 MED ORDER — ACETAMINOPHEN 500 MG PO TABS
1000.0000 mg | ORAL_TABLET | Freq: Once | ORAL | Status: AC
  Administered 2019-12-15: 500 mg via ORAL
  Filled 2019-12-15: qty 2

## 2019-12-15 MED ORDER — ONDANSETRON HCL 4 MG/2ML IJ SOLN: 4.0000 mg | Freq: Four times a day (QID) | INTRAMUSCULAR | Status: DC | PRN

## 2019-12-15 MED ORDER — SOD CITRATE-CITRIC ACID 500-334 MG/5ML PO SOLN
30.0000 mL | ORAL | Status: DC | PRN
  Administered 2019-12-17: 30 mL via ORAL
  Filled 2019-12-15: qty 30

## 2019-12-15 MED ORDER — ZOLPIDEM TARTRATE 5 MG PO TABS: 5.0000 mg | ORAL_TABLET | Freq: Every evening | ORAL | Status: DC | PRN

## 2019-12-15 NOTE — MAU Provider Note (Addendum)
Chief Complaint  Patient presents with  . Hypertension  . pelvic pressure     First Provider Initiated Contact with Patient 12/15/19 1835      S: Alexandria Boyle  is a 44 y.o. y.o. year old G37P1011 female at [redacted]w[redacted]d weeks gestation who presents to MAU from the office with elevated blood pressures. She denies any history of HTN. BP in the office was 146/87 then 132/90 at recheck. She reports a very slight headache, rates pain 2-3/10. Denies vision changes, RUQ or epigastric pain. Pregnancy is complicated by AMA, fetal renal anomaly, obesity, and hx of cesarean section   Contractions: occasional contractions that are not painful  Vaginal bleeding: denies  Fetal movement: present   O:  Patient Vitals for the past 24 hrs:  BP Temp Temp src Pulse Resp SpO2 Height Weight  12/15/19 1945 (!) 143/85 -- -- 93 -- -- -- --  12/15/19 1930 120/75 -- -- 92 -- -- -- --  12/15/19 1916 133/65 -- -- 97 -- -- -- --  12/15/19 1900 135/80 -- -- 94 -- -- -- --  12/15/19 1845 (!) 124/92 -- -- 100 -- -- -- --  12/15/19 1828 124/86 -- -- 98 -- -- -- --  12/15/19 1746 127/90 98.9 F (37.2 C) Oral (!) 110 20 99 % 5' (1.524 m) 90.3 kg   General: NAD Heart: Regular rate Lungs: Normal rate and effort Abd: Soft, NT, Gravid, S=D Extremities: no pedal edema Neuro: 2+ deep tendon reflexes, No clonus     FHR: 145/moderate/+accels/ occasional late decelerations  Toco: irregular mild contractions    MDM:  Treatments in MAU including Tylenol 1,000mg  for HA, patient refused to take both pills for treatment, only took 500mg    Results for orders placed or performed during the hospital encounter of 12/15/19 (from the past 24 hour(s))  CBC     Status: Abnormal   Collection Time: 12/15/19  6:21 PM  Result Value Ref Range   WBC 8.4 4.0 - 10.5 K/uL   RBC 4.34 3.87 - 5.11 MIL/uL   Hemoglobin 11.6 (L) 12.0 - 15.0 g/dL   HCT 36.4 36.0 - 46.0 %   MCV 83.9 80.0 - 100.0 fL   MCH 26.7 26.0 - 34.0 pg   MCHC 31.9 30.0 -  36.0 g/dL   RDW 15.0 11.5 - 15.5 %   Platelets 186 150 - 400 K/uL   nRBC 0.0 0.0 - 0.2 %  Comprehensive metabolic panel     Status: Abnormal   Collection Time: 12/15/19  6:21 PM  Result Value Ref Range   Sodium 134 (L) 135 - 145 mmol/L   Potassium 3.7 3.5 - 5.1 mmol/L   Chloride 106 98 - 111 mmol/L   CO2 19 (L) 22 - 32 mmol/L   Glucose, Bld 128 (H) 70 - 99 mg/dL   BUN 9 6 - 20 mg/dL   Creatinine, Ser 0.63 0.44 - 1.00 mg/dL   Calcium 8.7 (L) 8.9 - 10.3 mg/dL   Total Protein 6.6 6.5 - 8.1 g/dL   Albumin 2.7 (L) 3.5 - 5.0 g/dL   AST 18 15 - 41 U/L   ALT 18 0 - 44 U/L   Alkaline Phosphatase 115 38 - 126 U/L   Total Bilirubin 0.8 0.3 - 1.2 mg/dL   GFR calc non Af Amer >60 >60 mL/min   GFR calc Af Amer >60 >60 mL/min   Anion gap 9 5 - 15  Type and screen [place]  Status: None (Preliminary result)   Collection Time: 12/15/19  6:25 PM  Result Value Ref Range   ABO/RH(D) PENDING    Antibody Screen PENDING    Sample Expiration      12/18/2019,2359 Performed at [facility], [place] [address] [place], [place] [address]   Protein / creatinine ratio, urine     Status: None   Collection Time: 12/15/19  6:28 PM  Result Value Ref Range   Creatinine, Urine 87.94 mg/dL   Total Protein, Urine 9 mg/dL   Protein Creatinine Ratio 0.10 0.00 - 0.15 mg/mg[Cre]  Urinalysis, Routine w reflex microscopic     Status: Abnormal   Collection Time: 12/15/19  6:31 PM  Result Value Ref Range   Color, Urine YELLOW YELLOW   APPearance CLEAR CLEAR   Specific Gravity, Urine 1.016 1.005 - 1.030   pH 5.0 5.0 - 8.0   Glucose, UA NEGATIVE NEGATIVE mg/dL   Hgb urine dipstick SMALL (A) NEGATIVE   Bilirubin Urine NEGATIVE NEGATIVE   Ketones, ur 5 (A) NEGATIVE mg/dL   Protein, ur NEGATIVE NEGATIVE mg/dL   Nitrite NEGATIVE NEGATIVE   Leukocytes,Ua NEGATIVE NEGATIVE   RBC / HPF 6-10 0 - 5 RBC/hpf   WBC, UA 0-5 0 - 5 WBC/hpf   Bacteria, UA RARE (A) NONE SEEN   Squamous Epithelial /  LPF 0-5 0 - 5   Mucus PRESENT    PEC labs normal  Occasional late decelerations  Vertex presentation confirmed by bedside US   Consult with Dr Elly Modena, recommends admission to LD for induction d/t GHTN and decelerations  Patient reports that she wants TOLAC, consent signed on 4/7  A: [redacted]w[redacted]d week IUP Gestational hypertension  late decelerations on NST   P: Admit to L&D  Orders for admission placed  L&D team aware and care taken over by L&D   Lajean Manes, CNM 12/15/2019 8:01 PM

## 2019-12-15 NOTE — MAU Note (Signed)
Went in to do covid swab and pt very hesitant to have swab done. Explained pt would be treated as positive the entire stay and implications of that. Pt decided she would do the swab herself. Nurse did assist alittle but pt did own swab appropriately

## 2019-12-15 NOTE — Progress Notes (Signed)
ROB/GBS.  C/o pressure.

## 2019-12-15 NOTE — MAU Note (Signed)
Sent from prenatal visit, BP elevated.  Slight HA,  Denies epigastric pain, visual changes or increase in swelling. Denies bleeding or leaking.

## 2019-12-15 NOTE — Progress Notes (Signed)
   PRENATAL VISIT NOTE  Subjective:  Alexandria Boyle is a 44 y.o. G3P1011 at [redacted]w[redacted]d being seen today for ongoing prenatal care.  She is currently monitored for the following issues for this high-risk pregnancy and has Syncope and collapse; Chronic back pain; Anemia, iron deficiency; Fibromyalgia; Numbness; Chronic pain syndrome; Uterine fibroid; Chronic midline low back pain with bilateral sciatica; Anxiety; Pelvic pain affecting pregnancy; No prenatal care in current pregnancy in second trimester; Supervision of other normal pregnancy, antepartum; AMA, Age>40; Fetal renal anomaly, single gestation; Obesity affecting pregnancy; and History of cesarean section complicating pregnancy on their problem list.  Patient reports no complaints.  Contractions: Irregular. Vag. Bleeding: None, Scant.  Movement: Present. Denies leaking of fluid.   The following portions of the patient's history were reviewed and updated as appropriate: allergies, current medications, past family history, past medical history, past social history, past surgical history and problem list.   Objective:   Vitals:   12/15/19 1503 12/15/19 1545  BP: (!) 146/87 132/90  Pulse: 88 94  Weight: 199 lb 8 oz (90.5 kg)     Fetal Status: Fetal Heart Rate (bpm): 139   Movement: Present     General:  Alert, oriented and cooperative. Patient is in no acute distress.  Skin: Skin is warm and dry. No rash noted.   Cardiovascular: Normal heart rate noted  Respiratory: Normal respiratory effort, no problems with respiration noted  Abdomen: Soft, gravid, appropriate for gestational age.  Pain/Pressure: Present     Pelvic: Cervical exam deferred        Extremities: Normal range of motion.  Edema: Trace  Mental Status: Normal mood and affect. Normal behavior. Normal judgment and thought content.   Assessment and Plan:  Pregnancy: G3P1011 at [redacted]w[redacted]d  1. Supervision of other normal pregnancy, antepartum - Strep Gp B NAA - Cervicovaginal  ancillary only( [place]) - pt declined to have me do vaginal swabs but allowed me to rectal swab for GBS  2. Obesity affecting pregnancy in third trimester  3. History of cesarean section complicating pregnancy For TOLAC  4. AMA, Age>40 has not had fetal testing, ordered and scheduled today  5. Fetal renal anomaly, single gestation Crossed vs absent L kidney  6. Elevated blood pressure reading without diagnosis of hypertension Elevated today, pt denies history of hypertension - mildly elevated BP, to MAU for PEC rule out and BP monitoring - pt verbalizes understanding   Term labor symptoms and general obstetric precautions including but not limited to vaginal bleeding, contractions, leaking of fluid and fetal movement were reviewed in detail with the patient. Please refer to After Visit Summary for other counseling recommendations.   Return in about 1 week (around 12/22/2019) for high OB, in person.  No future appointments.  Sloan Leiter, MD

## 2019-12-15 NOTE — H&P (Addendum)
OBSTETRIC ADMISSION HISTORY AND PHYSICAL  Alexandria Boyle is a 44 y.o. female G3P1011 with IUP at 72w1dby early ultrasound presenting for IOL for gHTN. No prior hx of HTN. Sent from office for 146/87 then recheck of 132/90. Slight headache in MAU that is now relieved. She reports +FMs, No LOF, no VB, no blurry vision, peripheral edema, and RUQ pain.  She plans on breast feeding. She request BTL for birth control. [facility] Dating: By [place]--->  Estimated Date of Delivery: 01/04/20  Sono:    '@[redacted]w[redacted]d' , CWD, normal anatomy, cephalic presentation, posterior placental lie, 1909g, 72% EFW   Prenatal History/Complications:  AMA Fetal renal anomaly Obesity Hx of c-section  Past Medical History: Past Medical History:  Diagnosis Date  . Allergy   . Arthritis   . Back disorder   . Complex regional pain syndrome   . CRPS (complex regional pain syndrome type I)   . CRPS (complex regional pain syndrome type I)   . Depression   . Fibromyalgia   . Paresthesia 07/13/2015  . Syncope and collapse 07/13/2015    Past Surgical History: Past Surgical History:  Procedure Laterality Date  . CESAREAN SECTION      Obstetrical History: OB History    Gravida  3   Para  1   Term  1   Preterm  0   AB  1   Living  1     SAB  1   TAB  0   Ectopic  0   Multiple  0   Live Births  1           Social History Social History   Socioeconomic History  . Marital status: Single    Spouse name: Not on file  . Number of children: 1  . Years of education: 144 . Highest education level: Not on file  Occupational History  . Occupation: pGeophysicist/field seismologist Tobacco Use  . Smoking status: Never Smoker  . Smokeless tobacco: Never Used  Substance and Sexual Activity  . Alcohol use: No  . Drug use: No  . Sexual activity: Yes    Birth control/protection: None  Other Topics Concern  . Not on file  Social History Narrative   Patient drinks about 1-2 cups of  caffeine daily.   Patient is right handed.    Social Determinants of Health   Financial Resource Strain:   . Difficulty of Paying Living Expenses:   Food Insecurity:   . Worried About RCharity fundraiserin the Last Year:   . RArboriculturistin the Last Year:   Transportation Needs:   . LFilm/video editor(Medical):   .Marland KitchenLack of Transportation (Non-Medical):   Physical Activity:   . Days of Exercise per Week:   . Minutes of Exercise per Session:   Stress:   . Feeling of Stress :   Social Connections:   . Frequency of Communication with Friends and Family:   . Frequency of Social Gatherings with Friends and Family:   . Attends Religious Services:   . Active Member of Clubs or Organizations:   . Attends CArchivistMeetings:   .Marland KitchenMarital Status:     Family History: Family History  Problem Relation Age of Onset  . Diabetes Mother   . Cancer Father   . Cancer Maternal Grandfather   . Diabetes Maternal Grandfather     Allergies: Allergies  Allergen Reactions  .  Bee Pollen Swelling  . Savella [Milnacipran]   . Tomato Itching and Swelling  . Citrus Rash  . Lyrica [Pregabalin] Rash  . Neurontin [Gabapentin] Swelling and Rash  . Strawberry Extract Itching and Rash    Medications Prior to Admission  Medication Sig Dispense Refill Last Dose  . cetirizine HCl (ZYRTEC) 5 MG/5ML SOLN Take 10 mLs (10 mg total) by mouth daily. 300 mL 3 12/15/2019 at 0100  . ferrous sulfate 325 (65 FE) MG tablet Take 1 tablet (325 mg total) by mouth 3 (three) times daily with meals. 90 tablet 3 12/14/2019 at Unknown time  . multivitamin (VIT W/EXTRA C) CHEW chewable tablet Chew 1 tablet by mouth daily.   12/14/2019 at Unknown time  . zolpidem (AMBIEN) 5 MG tablet Take 1 tablet (5 mg total) by mouth at bedtime as needed for sleep. 20 tablet 1   . acetaminophen (TYLENOL) 325 MG tablet Take 2 tablets (650 mg total) by mouth every 4 (four) hours as needed. 100 tablet 2   . Ascorbic Acid  (VITAMIN C PO) Take 1 tablet by mouth daily.     Marland Kitchen aspirin 81 MG tablet Take 1 tablet (81 mg total) by mouth daily. 90 tablet 4   . Blood Pressure Monitoring (BLOOD PRESSURE KIT) DEVI 1 kit by Does not apply route once a week. Check Blood Pressure regularly and record readings into the Babys[employer]s App.  Large Cuff.  DX O90.0 1 each 0   . capsaicin (ZOSTRIX) 0.025 % cream Apply topically 2 (two) times daily.     Water engineer Bandages & Supports (COMFORT FIT MATERNITY SUPP LG) MISC Wear daily when ambulating 1 each 0   . Elastic Bandages & Supports (COMFORT FIT MATERNITY SUPP SM) MISC 1 Units by Does not apply route daily as needed. 1 each 0   . Prenatal Vit-Fe Fumarate-FA (PRENATAL MULTIVITAMIN) TABS tablet Take 1 tablet by mouth daily at 12 noon.        Review of Systems   All systems reviewed and negative except as stated in HPI  Blood pressure 123/71, pulse 85, temperature 98.6 F (37 C), temperature source Oral, resp. rate 18, height 5' (1.524 m), weight 90.3 kg, last menstrual period 04/01/2019, SpO2 99 %, unknown if currently breastfeeding. General appearance: alert, cooperative, appears stated age and no distress Lungs: clear to auscultation bilaterally Heart: regular rate and rhythm Abdomen: soft, non-tender; bowel sounds normal Pelvic: deferred to Derrill Memo Extremities: Homans sign is negative, no sign of DVT Presentation: cephalic Fetal monitoringBaseline: 140 bpm, Variability: Good {> 6 bpm), Accelerations: Reactive and Decelerations: Late, 1 late relieved by positional switch Uterine activityFrequency: Every 20 minutes Dilation: 1 Effacement (%): Thick Station: -2 Exam by:: Derrill Memo, CNM   Prenatal labs: ABO, Rh: --/--/A POS, A POS Performed at [facility], [place] [address] [place], [place] [address]  [phone]) Antibody: NEG (05/12 1825) Rubella: 4.76 (02/17 1605) RPR: Non Reactive (02/17 1605)  HBsAg: Negative (02/17 1605)  HIV: Non Reactive (02/17 1605)  GBS:    PCR pending 1 hr Glucola declined by patient multiple times per office notes Genetic screening declined amniocentesis  Anatomy US: 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 Fused kidneys  Prenatal Transfer Tool  Maternal Diabetes: Unknown, decline glucola tests Genetic Screening: Declined Maternal Ultrasounds/Referrals: Fetal Kidney Anomalies Fetal Ultrasounds or other Referrals:  Other: Fetal renal abnormality: either fused kidney or absent L kidney Maternal Substance Abuse:  No Significant Maternal Medications:  None Significant Maternal Lab Results: None  Results for orders placed or performed during the hospital encounter of 12/15/19 (from the past 24 hour(s))  CBC   Collection Time: 12/15/19  6:21 PM  Result Value Ref Range   WBC 8.4 4.0 - 10.5 K/uL   RBC 4.34 3.87 - 5.11 MIL/uL   Hemoglobin 11.6 (L) 12.0 - 15.0 g/dL   HCT 36.4 36.0 - 46.0 %   MCV 83.9 80.0 - 100.0 fL   MCH 26.7 26.0 - 34.0 pg   MCHC 31.9 30.0 - 36.0 g/dL   RDW 15.0 11.5 - 15.5 %   Platelets 186 150 - 400 K/uL   nRBC 0.0 0.0 - 0.2 %  Comprehensive metabolic panel   Collection Time: 12/15/19  6:21 PM  Result Value Ref Range   Sodium 134 (L) 135 - 145 mmol/L   Potassium 3.7 3.5 - 5.1 mmol/L   Chloride 106 98 - 111 mmol/L   CO2 19 (L) 22 - 32 mmol/L   Glucose, Bld 128 (H) 70 - 99 mg/dL   BUN 9 6 - 20 mg/dL   Creatinine, Ser 0.63 0.44 - 1.00 mg/dL   Calcium 8.7 (L) 8.9 - 10.3 mg/dL   Total Protein 6.6 6.5 - 8.1 g/dL   Albumin 2.7 (L) 3.5 - 5.0 g/dL   AST 18 15 - 41 U/L   ALT 18 0 - 44 U/L   Alkaline Phosphatase 115 38 - 126 U/L   Total Bilirubin 0.8 0.3 - 1.2 mg/dL   GFR calc non Af Amer >60 >60 mL/min   GFR calc Af Amer >60 >60 mL/min   Anion gap 9 5 - 15  Type and screen [place]   Collection Time: 12/15/19  6:25 PM  Result Value Ref Range   ABO/RH(D) A POS    Antibody Screen NEG    Sample Expiration      12/18/2019,2359 Performed at [facility], [address] [place], [place]  [address]   ABO/Rh   Collection Time: 12/15/19  6:25 PM  Result Value Ref Range   ABO/RH(D)      A POS Performed at [facility] [address], [place], [place] [address]   Protein / creatinine ratio, urine   Collection Time: 12/15/19  6:28 PM  Result Value Ref Range   Creatinine, Urine 87.94 mg/dL   Total Protein, Urine 9 mg/dL   Protein Creatinine Ratio 0.10 0.00 - 0.15 mg/mg[Cre]  Urinalysis, Routine w reflex microscopic   Collection Time: 12/15/19  6:31 PM  Result Value Ref Range   Color, Urine YELLOW YELLOW   APPearance CLEAR CLEAR   Specific Gravity, Urine 1.016 1.005 - 1.030   pH 5.0 5.0 - 8.0   Glucose, UA NEGATIVE NEGATIVE mg/dL   Hgb urine dipstick SMALL (A) NEGATIVE   Bilirubin Urine NEGATIVE NEGATIVE   Ketones, ur 5 (A) NEGATIVE mg/dL   Protein, ur NEGATIVE NEGATIVE mg/dL   Nitrite NEGATIVE NEGATIVE   Leukocytes,Ua NEGATIVE NEGATIVE   RBC / HPF 6-10 0 - 5 RBC/hpf   WBC, UA 0-5 0 - 5 WBC/hpf   Bacteria, UA RARE (A) NONE SEEN   Squamous Epithelial / LPF 0-5 0 - 5   Mucus PRESENT     Patient Active Problem List   Diagnosis Date Noted  . Gestational hypertension, third trimester 12/15/2019  . Obesity affecting pregnancy 10/14/2019  . History of cesarean section complicating pregnancy 93/71/6967  . Fetal renal anomaly, single gestation 10/07/2019  . Supervision of other normal  pregnancy, antepartum 09/22/2019  . AMA, Age>40 09/22/2019  . Pelvic pain affecting pregnancy 09/03/2019  . No prenatal care in current pregnancy in second trimester 09/03/2019  . Anxiety 11/25/2018  . Chronic midline low back pain with bilateral sciatica 08/26/2018  . Uterine fibroid 04/24/2017  . Numbness 02/19/2017  . Chronic pain syndrome 02/19/2017  . Chronic back pain 09/19/2015  . Anemia, iron deficiency 09/19/2015  . Fibromyalgia 09/19/2015  . Syncope and collapse 07/13/2015    Assessment/Plan:  Kassadi L Daus is a 44 y.o. G3P1011 at 32w1dhere for IOL for new-onset  gHTN  #Labor: Admit to L&D floor for TOLAC. Hx of CS > 20 years ago, pt states because baby had meconium fluid, but likely from fetal bradycardia. Vertex presentation confirmed by [place] MAU. Will not give cytotec due to hx of CS. 1cm dilated, so inserted foley bulb, once it comes out will proceed with pitocin and AROM PRN. Anticipate vaginal delivery.  #Pain: As patient requests #FWB: Cat II, EFW 72% #ID: GBS unknown, will get GBS PCR.  #MOF: Breast #MOC: BTL #Circ: Yes, inpatient  #gHTN: New in office today with slight headache in MAU. P/C ratio of 0.10. ALT and AST WNL. Consider mag if severe features or if two readings of 160/110.  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 Declined amniocentesis for high aneuploidy risk.  #glucola declined: Pt declined glucola screenings multiple times, per office notes. Glucose on CMP 11/10/19: 103, glucose in MAU: 128.  #hx of iron-def anemia: Hgb of 11.6 on admission.  RAlroy Bailiff DO  12/15/2019, 10:32 PM  CNM attestation:  I have seen and examined this patient; I agree with above documentation in the resident's note.   Natosha L DBrightbillis a 44y.o. G3P1011 here for IOL due to gHTN with neg pre-e eval; prev C/S for fetal intolerance   PE: BP 118/62   Pulse 93   Temp 98.6 F (37 C) (Oral)   Resp 16   Ht 5' (1.524 m)   Wt 90.3 kg   LMP 04/01/2019 (Approximate)   SpO2 99%   BMI 38.86 kg/m  Gen: calm comfortable, NAD Resp: normal effort, no distress Abd: gravid  ROS, labs, PMH reviewed  Plan: -Admit to Labor & Delivery -Start with cx ripening with foley followed by Pitocin/AROM -GBS PCR collected- if positive will give PCN with Pit or ROM -Watch BPs & evidence of s/s pre-e (had mild h/a earlier but spont resolved; no severe range BPs and neg labs) -Still desires TOLAC- will proceed (signed consent in chart 11/10/19) -Plans ppBTL (signed 30d papers 11/10/19)  KMyrtis SerCNM 12/16/2019,  12:02 AM

## 2019-12-16 ENCOUNTER — Encounter (HOSPITAL_COMMUNITY): Payer: Self-pay | Admitting: Obstetrics and Gynecology

## 2019-12-16 ENCOUNTER — Inpatient Hospital Stay (HOSPITAL_COMMUNITY): Payer: Medicare Other | Admitting: Anesthesiology

## 2019-12-16 LAB — GROUP B STREP BY PCR: Group B strep by PCR: NEGATIVE

## 2019-12-16 LAB — CBC
HCT: 36.7 % (ref 36.0–46.0)
Hemoglobin: 11.7 g/dL — ABNORMAL LOW (ref 12.0–15.0)
MCH: 26.8 pg (ref 26.0–34.0)
MCHC: 31.9 g/dL (ref 30.0–36.0)
MCV: 84 fL (ref 80.0–100.0)
Platelets: 180 10*3/uL (ref 150–400)
RBC: 4.37 MIL/uL (ref 3.87–5.11)
RDW: 14.9 % (ref 11.5–15.5)
WBC: 10.7 10*3/uL — ABNORMAL HIGH (ref 4.0–10.5)
nRBC: 0 % (ref 0.0–0.2)

## 2019-12-16 LAB — CERVICOVAGINAL ANCILLARY ONLY
Chlamydia: NEGATIVE
Comment: NEGATIVE
Comment: NORMAL
Neisseria Gonorrhea: NEGATIVE

## 2019-12-16 LAB — SARS CORONAVIRUS 2 (TAT 6-24 HRS): SARS Coronavirus 2: NEGATIVE

## 2019-12-16 LAB — RPR: RPR Ser Ql: NONREACTIVE

## 2019-12-16 MED ORDER — EPHEDRINE 5 MG/ML INJ: 10.0000 mg | INTRAVENOUS | Status: DC | PRN

## 2019-12-16 MED ORDER — CETIRIZINE HCL 5 MG/5ML PO SOLN
10.0000 mg | Freq: Every day | ORAL | Status: DC
  Administered 2019-12-16: 2 mg via ORAL
  Filled 2019-12-16 (×2): qty 10

## 2019-12-16 MED ORDER — PHENYLEPHRINE 40 MCG/ML (10ML) SYRINGE FOR IV PUSH (FOR BLOOD PRESSURE SUPPORT): 80.0000 ug | PREFILLED_SYRINGE | INTRAVENOUS | Status: DC | PRN

## 2019-12-16 MED ORDER — SODIUM CHLORIDE (PF) 0.9 % IJ SOLN
INTRAMUSCULAR | Status: DC | PRN
  Administered 2019-12-16: 12 mL/h via EPIDURAL

## 2019-12-16 MED ORDER — LACTATED RINGERS IV SOLN
500.0000 mL | Freq: Once | INTRAVENOUS | Status: AC
  Administered 2019-12-16: 1000 mL via INTRAVENOUS

## 2019-12-16 MED ORDER — OXYTOCIN 40 UNITS IN NORMAL SALINE INFUSION - SIMPLE MED
1.0000 m[IU]/min | INTRAVENOUS | Status: DC
  Administered 2019-12-16: 1 m[IU]/min via INTRAVENOUS

## 2019-12-16 MED ORDER — EPHEDRINE 5 MG/ML INJ
10.0000 mg | INTRAVENOUS | Status: DC | PRN
  Filled 2019-12-16: qty 10

## 2019-12-16 MED ORDER — TERBUTALINE SULFATE 1 MG/ML IJ SOLN: 0.2500 mg | Freq: Once | INTRAMUSCULAR | Status: DC | PRN

## 2019-12-16 MED ORDER — FENTANYL CITRATE (PF) 100 MCG/2ML IJ SOLN
100.0000 ug | INTRAMUSCULAR | Status: DC | PRN
  Administered 2019-12-16 (×4): 100 ug via INTRAVENOUS
  Filled 2019-12-16 (×4): qty 2

## 2019-12-16 MED ORDER — FENTANYL-BUPIVACAINE-NACL 0.5-0.125-0.9 MG/250ML-% EP SOLN
12.0000 mL/h | EPIDURAL | Status: DC | PRN
  Filled 2019-12-16: qty 250

## 2019-12-16 MED ORDER — LACTATED RINGERS IV SOLN: 500.0000 mL | Freq: Once | INTRAVENOUS | Status: AC

## 2019-12-16 MED ORDER — DIPHENHYDRAMINE HCL 50 MG/ML IJ SOLN: 12.5000 mg | INTRAMUSCULAR | Status: DC | PRN

## 2019-12-16 NOTE — Anesthesia Preprocedure Evaluation (Signed)
Anesthesia Evaluation  Patient identified by MRN, date of birth, ID band Patient awake    Reviewed: Allergy & Precautions, Patient's Chart, lab work & pertinent test results  Airway Mallampati: II       Dental   Pulmonary neg pulmonary ROS,    Pulmonary exam normal        Cardiovascular hypertension, Normal cardiovascular exam     Neuro/Psych  Neuromuscular disease    GI/Hepatic negative GI ROS, Neg liver ROS,   Endo/Other  negative endocrine ROS  Renal/GU negative Renal ROS     Musculoskeletal  (+) Arthritis , Fibromyalgia -  Abdominal   Peds  Hematology negative hematology ROS (+)   Anesthesia Other Findings   Reproductive/Obstetrics (+) Pregnancy (44yo G2 IOL 2/2 elevated BP)                             Lab Results  Component Value Date   WBC 10.7 (H) 12/16/2019   HGB 11.7 (L) 12/16/2019   HCT 36.7 12/16/2019   MCV 84.0 12/16/2019   PLT 180 12/16/2019    Anesthesia Physical Anesthesia Plan  ASA: III  Anesthesia Plan: Epidural   Post-op Pain Management:    Induction:   PONV Risk Score and Plan: Treatment may vary due to age or medical condition  Airway Management Planned: Natural Airway  Additional Equipment: None  Intra-op Plan:   Post-operative Plan:   Informed Consent: I have reviewed the patients History and Physical, chart, labs and discussed the procedure including the risks, benefits and alternatives for the proposed anesthesia with the patient or authorized representative who has indicated his/her understanding and acceptance.       Plan Discussed with:   Anesthesia Plan Comments:         Anesthesia Quick Evaluation

## 2019-12-16 NOTE — Progress Notes (Signed)
Labor Progress Note Alexandria Boyle is a 44 y.o. G3P1011 at [redacted]w[redacted]d presented for IOL for gHTN  S:  Feeling some ctx.  O:  BP 110/67   Pulse 94   Temp 98.6 F (37 C) (Oral)   Resp 18   Ht 5' (1.524 m)   Wt 90.3 kg   LMP 04/01/2019 (Approximate)   SpO2 99%   BMI 38.86 kg/m  EFM: baseline 145 bpm/ mod variability/ + accels/ occ variable decels  Toco/IUPC: q4 SVE: deferred  A/P: 44 y.o. G3P1011 [redacted]w[redacted]d  1. Labor: latent 2. FWB: Cat II 3. Pain: analgesia/anesthesia prn 4. GHTN: stable  Vtx confirmed by BSUS. Will start Pitocin. Anticipate labor progress and VBAC.  Julianne Handler, CNM 10:29 AM

## 2019-12-16 NOTE — Progress Notes (Signed)
FB still in place. Recheck cervical exam and start pitocin when FB removed.

## 2019-12-16 NOTE — Progress Notes (Signed)
Labor Progress Note Alexandria Boyle is a 44 y.o. G3P1011 at [redacted]w[redacted]d presented for IOL for gHTN  S: Some pain with recent contractions.   O:  BP 129/60   Pulse 83   Temp 98.6 F (37 C) (Oral)   Resp 17   Ht 5' (1.524 m)   Wt 90.3 kg   LMP 04/01/2019 (Approximate)   SpO2 99%   BMI 38.86 kg/m  [name], moderate variability, no accels, no decels  CVE: Dilation: 1 Effacement (%): Thick Station: -2 Presentation: Vertex Exam by:: Derrill Memo, CNM   A&P: 44 y.o. G3P1011 [redacted]w[redacted]d for IOL for gHTN  #Labor: FB still in place. Once foley bulb removed, will start pitocin. Consider AROM PRN. #Pain: As patient requests #FWB: Cat 1, EFW 72% #GBS negative #gHTN: New in office today with slight headache in MAU. P/C ratio of 0.10. ALT and AST WNL. BP since admission WNL. Consider mag if severe features or if two readings of 160/110.  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 Declined amniocentesis for high aneuploidy risk.   [name], DO 2:33 AM

## 2019-12-16 NOTE — Progress Notes (Signed)
Labor Progress Note  Alexandria Boyle is a 44 y.o. G3P1011 at 42w2dpresented for IOL for gHTN.   S: Met patient and had a 30 minute discussion about plan and patient's thoughts. Patient is perseverating on her circulation and worried about how numb she feels with her epidural. She ultimately would like a c-section now to be done but when she was told she would have to be more numb for a c-section, she decided she did not want that.    O:  BP (!) 147/88   Pulse (!) 121   Temp 98.4 F (36.9 C)   Resp 20   Ht 5' (1.524 m)   Wt 90.3 kg   LMP 04/01/2019 (Approximate)   SpO2 98%   BMI 38.86 kg/m  EFM: 135, moderate variability, pos accels, no decels, reactive TOCO: q637mDilation: 4 Effacement (%): 70 Cervical Position: Anterior Station: 0 Presentation: Vertex Exam by:: Aubry Tucholski, MD  A&P: [age].o. [id number] for IOL for gHTN.   #Labor: TOLAC. S/p FB and AROM. Pitocin has been turned off twice due to lates. IUPC placed now and will restart Pit; MVU's clearly inadequate now. Amnioinfusion PRN. Patient in hands and knees as a compromise for not being able to get up and walk with assistance as she desires with her epidural. Discussed that if she needs to go for a c-section, she will have to have her epidural dosed up regardless of her concerns about this. Guarded for VBAC; CS as appropriate. #Pain: epidural #FWB: Cat I currently; has had lates and variables throughout the day #GBS negative  #anxiety/irrational fears: Hands and knees assisting this. Will attempt to give her a sense that we are hearing what she is saying but also redirect to reality.  #gHTN: Labs WNL. Mild range pressures currently.   CheBarrington EllisonD OB [facility] LLPm[facility] Medicine Fellow, FacTri[facility] Instit[facility]Dean [facility] Companyon[facility]

## 2019-12-16 NOTE — Progress Notes (Signed)
Labor Progress Note Alexandria Boyle is a 44 y.o. G3P1011 at [redacted]w[redacted]d presented for IOL for gHTN  S: Patient comfortable s/p epidural.  Feeling pelvic pressure.  Denies headache, RUQ pain and vision changes. Endorses nausea.   O:  BP 126/65   Pulse 91   Temp 98.6 F (37 C) (Oral)   Resp 18   Ht 5' (1.524 m)   Wt 90.3 kg   LMP 04/01/2019 (Approximate)   SpO2 99%   BMI 38.86 kg/m  FHT:  140 / moderate variability / occ variable decels one of which concerning for late decel though accels occurring  TOCO: 2-4 minutes   CVE: Dilation: (P) 5 Effacement (%): (P) 90 Cervical Position: (P) Anterior Station: 0 Presentation: (P) Vertex Exam by:: (P) Julianne Handler, CNM   A&P: 44 y.o. [id number] [redacted]w[redacted]d for IOL for gHTN  #Labor: Progressing s.p Foley bulb. AROM discussed and patient agreeable.  AROM successful.  Continue pitocin.  If variables continue will consider pitocin break.  Continued discussion for risks of TOLAC and possible C-section.  #Pain: Epidural #FWB: Cat 2, reassuring with moderate variability  #GBS negative   #gHTN: Normotensive to mildly hypertensive.  Asymptomatic.    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 Declined amniocentesis for high aneuploidy risk.   Lyndee Hensen, DO 3:34 PM

## 2019-12-16 NOTE — Anesthesia Procedure Notes (Signed)
Epidural Patient location during procedure: OB Start time: 12/16/2019 2:15 PM End time: 12/16/2019 2:22 PM  Staffing Anesthesiologist: Suzette Battiest, MD Performed: anesthesiologist   Preanesthetic Checklist Completed: patient identified, IV checked, site marked, risks and benefits discussed, surgical consent, monitors and equipment checked, pre-op evaluation and timeout performed  Epidural Patient position: sitting Prep: DuraPrep and site prepped and draped Patient monitoring: continuous pulse ox and blood pressure Approach: midline Location: L4-L5 Injection technique: LOR air  Needle:  Needle type: Tuohy  Needle gauge: 17 G Needle length: 9 cm and 9 Needle insertion depth: 6 cm Catheter type: closed end flexible Catheter size: 19 Gauge Catheter at skin depth: 11 cm Test dose: negative  Assessment Events: blood not aspirated, injection not painful, no injection resistance, no paresthesia and negative IV test

## 2019-12-16 NOTE — Progress Notes (Signed)
Labor Progress Note  Alexandria Boyle is a 44 y.o. G3P1011 at [redacted]w[redacted]d presented for IOL for gHTN.   S: Patient endorsed feelings of poor oxygenation and blood flow. Patient felt walking around would alleviate these feelings. Feels like she needs to lie flat for proper flow, if she cannot walk.  Pt. Requests door be left open and legs not be squeezed. Pt. Mentioned impending feelings of death and that she needs help. Pt. Endorsed sweatiness, nausea, feels like she cannot breathe. Denies RUQ pain, headaches, vision changes, and lightheadedness.    O:  BP (!) 155/86 (BP Location: Right Arm)   Pulse (!) 116   Temp 98.2 F (36.8 C) (Axillary)   Resp 20   Ht 5' (1.524 m)   Wt 90.3 kg   LMP 04/01/2019 (Approximate)   SpO2 99%   BMI 38.86 kg/m  EFM: 145/moderate variability/pccasional variable decels and ?late decels  CVE: Deferred.   A&P: 44 y.o. G3P1011 [redacted]w[redacted]d admitted for IOL for gHTN.   #Labor: Patient desires TOLAC. S/p foley bulb and AROM. Variable decels and occasional late decels. Pitocin was stopped at [place] restrated at 1620. However pt. Started having variables decels again pitocin was stopped at 1646. Restart Pitocin in 20 minutes or when Cat 1 strip present.  Reassess in 3-4 hrs. Hopeful for vaginal delivery.    #Pain: epidural  #FWB: cat 2; reassuring moderate variability  #GBS negative   #anxiety/panic: offered medications but pt. Refused. Repositioned patient per her request.   Ok Edwards, Medical Student 7:07 PM      RESIDENT ATTESTATION OF STUDENT NOTE   I personally evaluated this patient along with the student, and verified all aspects of the history, physical exam, and medical decision making as documented by the student. I agree with the student's documentation and have made all necessary edits.   Lyndee Hensen, DO PGY-1, [facility] 12/16/2019 7:29 PM

## 2019-12-16 NOTE — Progress Notes (Signed)
Patient requested for epidural medicine to be turned down. Nurse and provider consulted with Anesthesiologist and patient agreed for epidural dose to be reduced from 71ml to 6 ml/hour. Patient reported "I dont want my legs to go numb. I need to feel my legs."  Patient seemed content with suggestions and epidural is now at 70ml per hour.

## 2019-12-17 ENCOUNTER — Encounter (HOSPITAL_COMMUNITY)
Admission: AD | Disposition: A | Discharge status: Home / Self Care | Payer: Self-pay | Source: Home / Self Care | Attending: Obstetrics and Gynecology

## 2019-12-17 ENCOUNTER — Encounter (HOSPITAL_COMMUNITY): Payer: Self-pay | Admitting: Obstetrics and Gynecology

## 2019-12-17 DIAGNOSIS — Z302 Encounter for sterilization: Secondary | ICD-10-CM

## 2019-12-17 DIAGNOSIS — Z3A37 37 weeks gestation of pregnancy: Secondary | ICD-10-CM

## 2019-12-17 LAB — CBC
HCT: 34.4 % — ABNORMAL LOW (ref 36.0–46.0)
Hemoglobin: 11 g/dL — ABNORMAL LOW (ref 12.0–15.0)
MCH: 27.2 pg (ref 26.0–34.0)
MCHC: 32 g/dL (ref 30.0–36.0)
MCV: 85.1 fL (ref 80.0–100.0)
Platelets: 182 10*3/uL (ref 150–400)
RBC: 4.04 MIL/uL (ref 3.87–5.11)
RDW: 15.2 % (ref 11.5–15.5)
WBC: 14.9 10*3/uL — ABNORMAL HIGH (ref 4.0–10.5)
nRBC: 0 % (ref 0.0–0.2)

## 2019-12-17 LAB — STREP GP B NAA: Strep Gp B NAA: POSITIVE — AB

## 2019-12-17 SURGERY — Surgical Case
Anesthesia: Epidural | Wound class: Clean Contaminated

## 2019-12-17 MED ORDER — OXYTOCIN 10 UNIT/ML IJ SOLN
INTRAMUSCULAR | Status: DC | PRN
Start: 2019-12-17 — End: 2019-12-17
  Administered 2019-12-17: 40 [IU] via INTRAMUSCULAR

## 2019-12-17 MED ORDER — DEXAMETHASONE SODIUM PHOSPHATE 4 MG/ML IJ SOLN
INTRAMUSCULAR | Status: DC | PRN
Start: 2019-12-17 — End: 2019-12-17
  Administered 2019-12-17: 4 mg via INTRAVENOUS

## 2019-12-17 MED ORDER — OXYTOCIN 40 UNITS IN NORMAL SALINE INFUSION - SIMPLE MED: 2.5000 [IU]/h | INTRAVENOUS | Status: AC

## 2019-12-17 MED ORDER — MEPERIDINE HCL 25 MG/ML IJ SOLN
INTRAMUSCULAR | Status: AC
  Filled 2019-12-17: qty 1

## 2019-12-17 MED ORDER — KETOROLAC TROMETHAMINE 30 MG/ML IJ SOLN: 30.0000 mg | Freq: Four times a day (QID) | INTRAMUSCULAR | Status: AC | PRN

## 2019-12-17 MED ORDER — MEPERIDINE HCL 25 MG/ML IJ SOLN: 6.2500 mg | INTRAMUSCULAR | Status: DC | PRN

## 2019-12-17 MED ORDER — PROPOFOL 10 MG/ML IV BOLUS
INTRAVENOUS | Status: AC
  Filled 2019-12-17: qty 20

## 2019-12-17 MED ORDER — LIDOCAINE 2% (20 MG/ML) 5 ML SYRINGE
INTRAMUSCULAR | Status: AC
  Filled 2019-12-17: qty 5

## 2019-12-17 MED ORDER — MEPERIDINE HCL 25 MG/ML IJ SOLN
INTRAMUSCULAR | Status: DC | PRN
  Administered 2019-12-17 (×2): 12.5 mg via INTRAVENOUS

## 2019-12-17 MED ORDER — SCOPOLAMINE 1 MG/3DAYS TD PT72
1.0000 | MEDICATED_PATCH | Freq: Once | TRANSDERMAL | Status: DC
  Filled 2019-12-17: qty 1

## 2019-12-17 MED ORDER — SIMETHICONE 80 MG PO CHEW: 80.0000 mg | CHEWABLE_TABLET | ORAL | Status: DC | PRN

## 2019-12-17 MED ORDER — NALBUPHINE HCL 10 MG/ML IJ SOLN: 5.0000 mg | Freq: Once | INTRAMUSCULAR | Status: DC | PRN

## 2019-12-17 MED ORDER — DIPHENHYDRAMINE HCL 25 MG PO CAPS: 25.0000 mg | ORAL_CAPSULE | ORAL | Status: DC | PRN

## 2019-12-17 MED ORDER — SODIUM CHLORIDE 0.9 % IV SOLN
INTRAVENOUS | Status: DC | PRN
Start: 2019-12-17 — End: 2019-12-17

## 2019-12-17 MED ORDER — ACETAMINOPHEN 325 MG PO TABS
650.0000 mg | ORAL_TABLET | Freq: Four times a day (QID) | ORAL | Status: DC | PRN
  Administered 2019-12-18: 325 mg via ORAL
  Filled 2019-12-17 (×2): qty 2

## 2019-12-17 MED ORDER — ACETAMINOPHEN 500 MG PO TABS
1000.0000 mg | ORAL_TABLET | Freq: Four times a day (QID) | ORAL | Status: AC
  Filled 2019-12-17: qty 2

## 2019-12-17 MED ORDER — PRENATAL MULTIVITAMIN CH: 1.0000 | ORAL_TABLET | Freq: Every day | ORAL | Status: DC

## 2019-12-17 MED ORDER — HYDROMORPHONE HCL 1 MG/ML IJ SOLN: 0.2500 mg | INTRAMUSCULAR | Status: DC | PRN

## 2019-12-17 MED ORDER — SODIUM CHLORIDE 0.9 % IV SOLN
INTRAVENOUS | Status: AC
  Filled 2019-12-17: qty 500

## 2019-12-17 MED ORDER — DIPHENHYDRAMINE HCL 50 MG/ML IJ SOLN: 12.5000 mg | INTRAMUSCULAR | Status: DC | PRN

## 2019-12-17 MED ORDER — ZOLPIDEM TARTRATE 5 MG PO TABS: 5.0000 mg | ORAL_TABLET | Freq: Every evening | ORAL | Status: DC | PRN

## 2019-12-17 MED ORDER — MIDAZOLAM HCL 5 MG/5ML IJ SOLN
INTRAMUSCULAR | Status: DC | PRN
Start: 2019-12-17 — End: 2019-12-17
  Administered 2019-12-17: 1 mg via INTRAVENOUS

## 2019-12-17 MED ORDER — METOCLOPRAMIDE HCL 5 MG/ML IJ SOLN
INTRAMUSCULAR | Status: DC | PRN
  Administered 2019-12-17: 10 mg via INTRAVENOUS

## 2019-12-17 MED ORDER — LACTATED RINGERS IV SOLN: INTRAVENOUS | Status: DC

## 2019-12-17 MED ORDER — SODIUM CHLORIDE (PF) 0.9 % IJ SOLN
INTRAMUSCULAR | Status: AC
  Filled 2019-12-17: qty 10

## 2019-12-17 MED ORDER — KETOROLAC TROMETHAMINE 30 MG/ML IJ SOLN
INTRAMUSCULAR | Status: DC | PRN
Start: 2019-12-17 — End: 2019-12-17
  Administered 2019-12-17: 30 mg via INTRAVENOUS

## 2019-12-17 MED ORDER — SIMETHICONE 80 MG PO CHEW
80.0000 mg | CHEWABLE_TABLET | ORAL | Status: DC
  Administered 2019-12-17 – 2019-12-20 (×3): 80 mg via ORAL
  Filled 2019-12-17 (×3): qty 1

## 2019-12-17 MED ORDER — MORPHINE SULFATE (PF) 0.5 MG/ML IJ SOLN
INTRAMUSCULAR | Status: AC
  Filled 2019-12-17: qty 10

## 2019-12-17 MED ORDER — ONDANSETRON HCL 4 MG/2ML IJ SOLN
INTRAMUSCULAR | Status: AC
  Filled 2019-12-17: qty 2

## 2019-12-17 MED ORDER — KETOROLAC TROMETHAMINE 30 MG/ML IJ SOLN
30.0000 mg | Freq: Four times a day (QID) | INTRAMUSCULAR | Status: DC
  Filled 2019-12-17: qty 1

## 2019-12-17 MED ORDER — PROPOFOL 500 MG/50ML IV EMUL
INTRAVENOUS | Status: AC
  Filled 2019-12-17: qty 50

## 2019-12-17 MED ORDER — SIMETHICONE 80 MG PO CHEW
80.0000 mg | CHEWABLE_TABLET | Freq: Three times a day (TID) | ORAL | Status: DC
  Administered 2019-12-17 – 2019-12-20 (×7): 80 mg via ORAL
  Filled 2019-12-17 (×8): qty 1

## 2019-12-17 MED ORDER — FENTANYL CITRATE (PF) 100 MCG/2ML IJ SOLN
INTRAMUSCULAR | Status: AC
  Filled 2019-12-17: qty 2

## 2019-12-17 MED ORDER — NALOXONE HCL 4 MG/10ML IJ SOLN
1.0000 ug/kg/h | INTRAVENOUS | Status: DC | PRN
  Filled 2019-12-17: qty 5

## 2019-12-17 MED ORDER — LACTATED RINGERS IV SOLN: INTRAVENOUS | Status: DC | PRN

## 2019-12-17 MED ORDER — NALOXONE HCL 0.4 MG/ML IJ SOLN: 0.4000 mg | INTRAMUSCULAR | Status: DC | PRN

## 2019-12-17 MED ORDER — MORPHINE SULFATE (PF) 0.5 MG/ML IJ SOLN
INTRAMUSCULAR | Status: DC | PRN
  Administered 2019-12-17: 3 mg via EPIDURAL

## 2019-12-17 MED ORDER — DIBUCAINE (PERIANAL) 1 % EX OINT: 1.0000 "application " | TOPICAL_OINTMENT | CUTANEOUS | Status: DC | PRN

## 2019-12-17 MED ORDER — OXYCODONE HCL 5 MG PO TABS
5.0000 mg | ORAL_TABLET | ORAL | Status: DC | PRN
  Administered 2019-12-18 – 2019-12-20 (×7): 5 mg via ORAL
  Filled 2019-12-17 (×3): qty 1
  Filled 2019-12-17: qty 2
  Filled 2019-12-17 (×5): qty 1

## 2019-12-17 MED ORDER — PHENYLEPHRINE HCL (PRESSORS) 10 MG/ML IV SOLN
INTRAVENOUS | Status: DC | PRN
  Administered 2019-12-17 (×3): 80 ug via INTRAVENOUS
  Administered 2019-12-17: 120 ug via INTRAVENOUS
  Administered 2019-12-17 (×2): 80 ug via INTRAVENOUS

## 2019-12-17 MED ORDER — COCONUT OIL OIL: 1.0000 "application " | TOPICAL_OIL | Status: DC | PRN

## 2019-12-17 MED ORDER — IBUPROFEN 800 MG PO TABS
800.0000 mg | ORAL_TABLET | Freq: Four times a day (QID) | ORAL | Status: DC
  Filled 2019-12-17: qty 1

## 2019-12-17 MED ORDER — NALBUPHINE HCL 10 MG/ML IJ SOLN: 5.0000 mg | INTRAMUSCULAR | Status: DC | PRN

## 2019-12-17 MED ORDER — ENOXAPARIN SODIUM 40 MG/0.4ML ~~LOC~~ SOLN
40.0000 mg | SUBCUTANEOUS | Status: DC
  Administered 2019-12-17 – 2019-12-20 (×3): 40 mg via SUBCUTANEOUS
  Filled 2019-12-17 (×3): qty 0.4

## 2019-12-17 MED ORDER — MENTHOL 3 MG MT LOZG: 1.0000 | LOZENGE | OROMUCOSAL | Status: DC | PRN

## 2019-12-17 MED ORDER — SODIUM CHLORIDE 0.9% FLUSH: 3.0000 mL | INTRAVENOUS | Status: DC | PRN

## 2019-12-17 MED ORDER — CEFAZOLIN SODIUM-DEXTROSE 2-4 GM/100ML-% IV SOLN
INTRAVENOUS | Status: AC
  Filled 2019-12-17: qty 100

## 2019-12-17 MED ORDER — TETANUS-DIPHTH-ACELL PERTUSSIS 5-2.5-18.5 LF-MCG/0.5 IM SUSP: 0.5000 mL | Freq: Once | INTRAMUSCULAR | Status: DC

## 2019-12-17 MED ORDER — PROMETHAZINE HCL 25 MG/ML IJ SOLN: 6.2500 mg | INTRAMUSCULAR | Status: DC | PRN

## 2019-12-17 MED ORDER — WITCH HAZEL-GLYCERIN EX PADS: 1.0000 "application " | MEDICATED_PAD | CUTANEOUS | Status: DC | PRN

## 2019-12-17 MED ORDER — KETOROLAC TROMETHAMINE 30 MG/ML IJ SOLN: 30.0000 mg | Freq: Once | INTRAMUSCULAR | Status: DC | PRN

## 2019-12-17 MED ORDER — SODIUM CHLORIDE 0.9 % IV SOLN
500.0000 mg | INTRAVENOUS | Status: AC
  Administered 2019-12-17: 500 mg via INTRAVENOUS

## 2019-12-17 MED ORDER — KETOROLAC TROMETHAMINE 30 MG/ML IJ SOLN
INTRAMUSCULAR | Status: AC
  Filled 2019-12-17: qty 1

## 2019-12-17 MED ORDER — DIPHENHYDRAMINE HCL 25 MG PO CAPS
25.0000 mg | ORAL_CAPSULE | Freq: Four times a day (QID) | ORAL | Status: DC | PRN
  Filled 2019-12-17: qty 1

## 2019-12-17 MED ORDER — METOCLOPRAMIDE HCL 5 MG/ML IJ SOLN
INTRAMUSCULAR | Status: AC
  Filled 2019-12-17: qty 2

## 2019-12-17 MED ORDER — SCOPOLAMINE 1 MG/3DAYS TD PT72
MEDICATED_PATCH | TRANSDERMAL | Status: DC | PRN
  Administered 2019-12-17: 1 via TRANSDERMAL

## 2019-12-17 MED ORDER — MIDAZOLAM HCL 2 MG/2ML IJ SOLN
INTRAMUSCULAR | Status: AC
  Filled 2019-12-17: qty 2

## 2019-12-17 MED ORDER — FENTANYL CITRATE (PF) 100 MCG/2ML IJ SOLN
INTRAMUSCULAR | Status: DC | PRN
  Administered 2019-12-17: 100 ug via INTRAVENOUS

## 2019-12-17 MED ORDER — SENNOSIDES-DOCUSATE SODIUM 8.6-50 MG PO TABS
2.0000 | ORAL_TABLET | ORAL | Status: DC
  Administered 2019-12-17 – 2019-12-20 (×3): 2 via ORAL
  Filled 2019-12-17 (×3): qty 2

## 2019-12-17 MED ORDER — CEFAZOLIN SODIUM-DEXTROSE 2-4 GM/100ML-% IV SOLN
2.0000 g | INTRAVENOUS | Status: AC
  Administered 2019-12-17: 2 g via INTRAVENOUS

## 2019-12-17 MED ORDER — ONDANSETRON HCL 4 MG/2ML IJ SOLN
INTRAMUSCULAR | Status: DC | PRN
  Administered 2019-12-17: 4 mg via INTRAVENOUS

## 2019-12-17 MED ORDER — ONDANSETRON HCL 4 MG/2ML IJ SOLN: 4.0000 mg | Freq: Three times a day (TID) | INTRAMUSCULAR | Status: DC | PRN

## 2019-12-17 MED ORDER — SCOPOLAMINE 1 MG/3DAYS TD PT72
MEDICATED_PATCH | TRANSDERMAL | Status: AC
  Filled 2019-12-17: qty 1

## 2019-12-17 MED ORDER — LIDOCAINE-EPINEPHRINE (PF) 2 %-1:200000 IJ SOLN
INTRAMUSCULAR | Status: DC | PRN
Start: 2019-12-17 — End: 2019-12-17
  Administered 2019-12-17 (×3): 5 mL via INTRADERMAL

## 2019-12-17 SURGICAL SUPPLY — 41 items
BENZOIN TINCTURE PRP APPL 2/3 (GAUZE/BANDAGES/DRESSINGS) ×3 IMPLANT
CHLORAPREP W/TINT 26ML (MISCELLANEOUS) ×3 IMPLANT
CLAMP CORD UMBIL (MISCELLANEOUS) IMPLANT
CLOSURE WOUND 1/2 X4 (GAUZE/BANDAGES/DRESSINGS) ×1
CLOTH BEACON ORANGE TIMEOUT ST (SAFETY) ×3 IMPLANT
DRAPE C SECTION CLR SCREEN (DRAPES) IMPLANT
DRSG OPSITE POSTOP 4X10 (GAUZE/BANDAGES/DRESSINGS) ×3 IMPLANT
ELECT REM PT RETURN 9FT ADLT (ELECTROSURGICAL) ×3
ELECTRODE REM PT RTRN 9FT ADLT (ELECTROSURGICAL) ×1 IMPLANT
EXTRACTOR VACUUM M CUP 4 TUBE (SUCTIONS) IMPLANT
EXTRACTOR VACUUM M CUP 4' TUBE (SUCTIONS)
GLOVE BIO SURGEON STRL SZ7.5 (GLOVE) ×3 IMPLANT
GLOVE BIOGEL PI IND STRL 7.0 (GLOVE) ×1 IMPLANT
GLOVE BIOGEL PI INDICATOR 7.0 (GLOVE) ×2
GOWN STRL REUS W/TWL 2XL LVL3 (GOWN DISPOSABLE) ×3 IMPLANT
GOWN STRL REUS W/TWL LRG LVL3 (GOWN DISPOSABLE) ×6 IMPLANT
KIT ABG SYR 3ML LUER SLIP (SYRINGE) IMPLANT
NEEDLE HYPO 22GX1.5 SAFETY (NEEDLE) ×3 IMPLANT
NEEDLE HYPO 25X5/8 SAFETYGLIDE (NEEDLE) IMPLANT
NS IRRIG 1000ML POUR BTL (IV SOLUTION) ×3 IMPLANT
PACK C SECTION WH (CUSTOM PROCEDURE TRAY) ×3 IMPLANT
PAD OB MATERNITY 4.3X12.25 (PERSONAL CARE ITEMS) ×3 IMPLANT
PENCIL SMOKE EVAC W/HOLSTER (ELECTROSURGICAL) ×3 IMPLANT
RTRCTR C-SECT PINK 25CM LRG (MISCELLANEOUS) ×3 IMPLANT
STRIP CLOSURE SKIN 1/2X4 (GAUZE/BANDAGES/DRESSINGS) ×2 IMPLANT
SUT CHROMIC 1 CTX 36 (SUTURE) ×6 IMPLANT
SUT PLAIN 2 0 XLH (SUTURE) ×3 IMPLANT
SUT VIC AB 1 CT1 36 (SUTURE) ×6 IMPLANT
SUT VIC AB 2-0 CT1 (SUTURE) ×3 IMPLANT
SUT VIC AB 2-0 CT1 27 (SUTURE) ×2
SUT VIC AB 2-0 CT1 TAPERPNT 27 (SUTURE) ×1 IMPLANT
SUT VIC AB 3-0 CT1 27 (SUTURE) ×4
SUT VIC AB 3-0 CT1 TAPERPNT 27 (SUTURE) ×2 IMPLANT
SUT VIC AB 3-0 SH 27 (SUTURE)
SUT VIC AB 3-0 SH 27X BRD (SUTURE) IMPLANT
SUT VIC AB 4-0 KS 27 (SUTURE) ×3 IMPLANT
SYR BULB IRRIGATION 50ML (SYRINGE) IMPLANT
TOWEL OR 17X24 6PK STRL BLUE (TOWEL DISPOSABLE) ×3 IMPLANT
TRAY FOLEY W/BAG SLVR 14FR LF (SET/KITS/TRAYS/PACK) ×3 IMPLANT
TUBE VACUTAINER EDTA 3ML 13X75 (MISCELLANEOUS) ×3 IMPLANT
WATER STERILE IRR 1000ML POUR (IV SOLUTION) ×6 IMPLANT
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
## 2019-12-17 NOTE — Op Note (Signed)
Alexandria Boyle PROCEDURE DATE: 12/17/2019  PREOPERATIVE DIAGNOSES: Intrauterine pregnancy at [redacted]w[redacted]d weeks gestation; non-reassuring fetal status and failed TOLAC/elective repeat; undesired fertility  POSTOPERATIVE DIAGNOSES: The same; lysis of adhesions  PROCEDURE: Repeat Low Transverse Cesarean Section, Bilateral Tubal Sterilization using Modified [place] Method  SURGEON:  Dr. Arlina Robes - Primary Dr. Barrington Ellison - Fellow  ANESTHESIOLOGIST: Dr. Jillyn Hidden  INDICATIONS: Alexandria Boyle is a 44 y.o. [id number] at [redacted]w[redacted]d here for cesarean section and bilateral tubal sterilization secondary to the indications listed under preoperative diagnoses; please see preoperative note for further details.  The risks of surgery were discussed with the patient including but were not limited to: bleeding which may require transfusion or reoperation; infection which may require antibiotics; injury to bowel, bladder, ureters or other surrounding organs; injury to the fetus; need for additional procedures including hysterectomy in the event of a life-threatening hemorrhage; placental abnormalities wth subsequent pregnancies, incisional problems, thromboembolic phenomenon and other postoperative/anesthesia complications.  Patient also desires permanent sterilization.  Other reversible forms of contraception were discussed with patient; she declines all other modalities. Risks of procedure discussed with patient including but not limited to: risk of regret, permanence of method, bleeding, infection, injury to surrounding organs and need for additional procedures.  Failure risk of 1-2% with increased risk of ectopic gestation if pregnancy occurs was also discussed with patient.  Also discussed possibility of post-tubal pain syndrome. The patient concurred with the proposed plan, giving informed written consent for the procedures.    FINDINGS:  Viable female infant in asynclitic cephalic presentation. Clear amniotic fluid.   Intact placenta, three vessel cord.  Normal uterus with anterior 3x2cm subserosal fibroid, fallopian tubes and ovaries bilaterally. Fallopian tubes were sterilized bilaterally. Thin filmy adhesions of bladder to lower uterine segment. Recto-fascial adhesions. Adhesions of rectus to uterus.  APGAR (1 MIN): 8   APGAR (5 MINS): 9   APGAR (10 MINS):    ANESTHESIA: Epidural INTRAVENOUS FLUIDS: 2400 ml ESTIMATED BLOOD LOSS: 200 ml URINE OUTPUT:  200 ml SPECIMENS: Placenta sent to L&D and bilateral fallopian tube fragments sent to pathology COMPLICATIONS: None immediate  PROCEDURE IN DETAIL:  The patient preoperatively received intravenous antibiotics and had sequential compression devices applied to her lower extremities.   She was then taken to the operating room where the epidural anesthesia was dosed up to surgical level and was found to be adequate. She was then placed in a dorsal supine position with a leftward tilt, and prepped and draped in a sterile manner.  A foley catheter had been previously placed into her bladder and attached to constant gravity.  After an adequate timeout was performed, a Pfannenstiel skin incision was made with scalpel over her preexisting scar and carried through to the underlying layer of fascia. The fascia was incised in the midline, and this incision was extended bilaterally using the Mayo scissors.  Kocher clamps were applied to the superior aspect of the fascial incision and the underlying rectus muscles were dissected off bluntly.  A similar process was carried out on the inferior aspect of the fascial incision. The rectus muscles were separated in the midline and the peritoneum was entered bluntly after sharp dissection of rectus adhesions. The Alexis self-retaining retractor was introduced into the abdominal cavity.  Attention was turned to the lower uterine segment where a low transverse hysterotomy was made with a scalpel and extended bilaterally bluntly.  The infant  was successfully delivered, the cord was clamped and cut after one minute and the infant  was handed over to the awaiting neonatology team. Uterine massage was then administered, and the placenta delivered intact with a three-vessel cord. The uterus was then cleared of clots and debris.  The hysterotomy was closed with 1 Chromic in a running locked fashion. Attention was then turned to the fallopian tubes. The patient's right fallopian tube was then identified, and the Babcock clamp was then used to grasp the tube approximately 3 cm from the cornual region. Another clamp was placed about 3 cm distal to this initial clamp.  An opening was created sharply in an avascular portion of the intervening mesosalpinx. Two free ties of 2-0 plain gut were passed through the opening and the proximal end of the tube was doubly ligated. Same process was carried out for the distal end. The 2 cm tube segment was then sharply excised between the sutures, and this was sent for pathology.  This entire process was repeated in an identical fashion to the left fallopian tube, allowing for bilateral tubal sterilization. Excellent hemostasis was noted. The pelvis was cleared of all clot and debris. Hemostasis was confirmed on all surfaces.  The retractor was removed.  The peritoneum was closed with a 2-0 Vicryl running stitch along with rectus muscles. The fascia was then closed using 1 Vicryl in a running fashion.  The subcutaneous layer was irrigated and reapproximated with 2-0 plain gut running stitches and the skin was closed with a 4-0 Vicryl subcuticular stitch. The patient tolerated the procedure well. Sponge, instrument and needle counts were correct x 3.  She was taken to the recovery room in stable condition.   Barrington Ellison, MD [facility] Family Medicine Fellow, [facility] for [employer], [place]

## 2019-12-17 NOTE — Progress Notes (Addendum)
Went bedside to discuss recurrent late decelerations with patient. She reports that if she is still 4 cm, she does not want to continue TOLAC as she cannot take her legs being numb any longer. Cervix unchanged. IOL started > 24 hours ago with minimal progress. The risks of cesarean section were discussed with the patient including but were not limited to: bleeding which may require transfusion or reoperation; infection which may require antibiotics; injury to bowel, bladder, ureters or other surrounding organs; injury to the fetus; need for additional procedures including hysterectomy in the event of a life-threatening hemorrhage; placental abnormalities wth subsequent pregnancies, incisional problems, thromboembolic phenomenon and other postoperative/anesthesia complications.  Patient also desires permanent sterilization. Discussed possible salpingectomy and patient desires this is possible. Other reversible forms of contraception were discussed with patient; she declines all other modalities. Risks of procedure discussed with patient including but not limited to: risk of regret, permanence of method, bleeding, infection, injury to surrounding organs and need for additional procedures.  Failure risk of about 1% with increased risk of ectopic gestation if pregnancy occurs was also discussed with patient.  Also discussed possibility of post-tubal pain syndrome. The patient concurred with the proposed plan, giving informed written consent for the procedures.  Patient has been NPO except clears since 1000 hours she will remain NPO for procedure. Anesthesia and OR aware.  Preoperative prophylactic antibiotics and SCDs ordered on call to the OR.  To OR when ready.  Barrington Ellison, MD [facility] Family Medicine Fellow, [facility] for [employer], [place]

## 2019-12-17 NOTE — Transfer of Care (Signed)
Immediate Anesthesia Transfer of Care Note  Patient: Alexandria Boyle  Procedure(s) Performed: CESAREAN SECTION WITH BILATERAL TUBAL LIGATION (N/A )  Patient Location: PACU  Anesthesia Type:Epidural  Level of Consciousness: awake, alert  and oriented  Airway & Oxygen Therapy: Patient Spontanous Breathing  Post-op Assessment: Report given to RN and Post -op Vital signs reviewed and stable  Post vital signs: Reviewed and stable  Last Vitals:  Vitals Value Taken Time  BP 106/80 12/17/19 0230  Temp    Pulse 98 12/17/19 0235  Resp 13 12/17/19 0235  SpO2 99 % 12/17/19 0235  Vitals shown include unvalidated device data.  Last Pain:  Vitals:   12/16/19 1913  TempSrc:   PainSc: Asleep      Patients Stated Pain Goal: 3 ([id number])  Complications: No apparent anesthesia complications

## 2019-12-17 NOTE — Consult Note (Signed)
Neonatology Note:   Attendance at C-section:    I was asked by Dr. Mariane Masters to attend this C/S at term 37 1/7 weeks for decels. 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 1hr glucola declined prenatally.  ROM 10h 20m prior to delivery, fluid clear. Infant vigorous with good spontaneous cry and tone. +60 sec DCC.  Needed minimal bulb suctioning.  Ap 8/9. Lungs clear to ausc in DR. Family updated.  To CN to care of Pediatrician.  Monitor voiding and obtain RUS and BMP after 24 hol.    Monia Sabal Katherina Mires, MD

## 2019-12-17 NOTE — Lactation Note (Signed)
This note was copied from a baby's chart. Lactation Consultation Note  Patient Name: Alexandria Boyle [id number] Date: 12/17/2019 Reason for consult: Initial assessment;Early term 37-38.6wks;Primapara;1st time breastfeeding  P2 mother whose infant is now 37 hours old.  This is an ETI at 37+3 weeks weighing <6 lbs.  Mother had originally not wanted a lactation consult.  RN called me for assistance per mother's request, however, an order was not placed.  RN was able to place an [facility] order and mother requested consult after 1500 today.  Baby was asleep when I arrived.  Reviewed the LPTI policy guidelines with mother.  Considerable amount of education provided.  I would suggest frequent review of breast feeding basic information to better prepare mother for breast feeding.  Hand expression taught and mother was unable to express any colostrum drops at this time.  Milk storage times reviewed and finger feeding demonstrated.  Mother has been giving Similac 22 calorie formula but now thinks the baby is allergic to it because she has some newborn rash on her face.  She does not want to give this formula any longer.  Discussed the need for a higher calorie formula and provided reasons why baby is consuming this formula.  Explained that her rash is a typical rash that many newborns get and this rash will go away without any special treatment.    Mother believes the baby is constantly cold because he "shivers."  Again, reviewed normal newborn characteristics with mother.  She witnessed that he does not shiver when held or swaddled.  Mother is anxious and asks many questions.  She seems nervous about typical well baby behavior.  Offered to assist with latching and mother agreeable.  Made suggestions as to good positions, proper positioning and how to hold baby.  Assisted to latch to the left breast, however, baby was not at all interested in sucking.  Mother stated she felt him sucking but no obvious jaw movements  noted.  No swallows were heard.  Demonstrated breast compressions and gentle stimulation.  Baby was still too sleepy to breast feed.    Demonstrated paced bottle feeding and mother extremely apprehensive about feeding him.  He gagged a couple of times and this made mother panic.  No emesis noted and baby remained calm.  Attempted to burp but unsuccessful.  Showed mother how to effectively burp.  Suck training performed and baby uncoordinated and chomping on my finger.  This bothered mother to watch me work with him.  She thought he might gag even though he remained very calm and quiet.  Allowed him to suck for a few minutes.  Demonstrated paced bottle feeding and provided cheek/jaw support to get him to maintain a grasp on the nipple.  Again, mother afraid he was going to "choke."  Reassured her that this is typical behavior and that baby has to practice sucking in order to attain a strong suck/swallow.  Continued to feed him while educating mother.  Baby consumed 9 mls, burped and remained content.  Suggested mother begin pumping with the DEBP, however, mother more interested in taking a shower at this time.  RN in room and offered to demonstrate the pump after mother is finished with her shower.  I believe the shower may help her relax a little bit.  She will awaken baby again in three hours and feed per instructions.  She will call her RN for assistance with latching and feeding.  Suggested she always feed STS and reasons given.  Mother insists that he will get cold but I tried to reiterate the benefits of STS.    Continue to educate and reinforce concepts.  Allow mother time to get used to baby and breast feeding.  Reminded her that it takes patience and practice and baby may not breast feed well for a couple of weeks.  Praised her for her efforts today.  RN updated.   Maternal Data Formula Feeding for Exclusion: Yes Reason for exclusion: Mother's choice to formula and breast feed on admission Has  patient been taught Hand Expression?: Yes Does the patient have breastfeeding experience prior to this delivery?: No  Feeding    LATCH Score Latch: Too sleepy or reluctant, no latch achieved, no sucking elicited.  Audible Swallowing: None  Type of Nipple: Everted at rest and after stimulation  Comfort (Breast/Nipple): Soft / non-tender  Hold (Positioning): Assistance needed to correctly position infant at breast and maintain latch.  LATCH Score: 5  Interventions Interventions: Breast feeding basics reviewed;Assisted with latch;Skin to skin;Breast massage;Hand express;Breast compression;Adjust position;DEBP;Position options;Support pillows  Lactation Tools Discussed/Used WIC Program: No(Has made an appt to apply)   Consult Status Consult Status: Follow-up Date: 12/18/19 Follow-up type: In-patient    Little Ishikawa 12/17/2019, 3:57 PM

## 2019-12-17 NOTE — Discharge Summary (Signed)
Postpartum Discharge Summary    Patient Name: Alexandria Boyle DOB: [birth year] MRN: [id number]  Date of admission: 12/15/2019 Delivery date:12/17/2019  Delivering provider: Chancy Milroy  Date of discharge: 12/20/2019  Admitting diagnosis: Gestational hypertension, third trimester [O13.3] Intrauterine pregnancy: [redacted]w[redacted]d    Secondary diagnosis:  Active Problems:   AMA, Age>40   Fetal renal anomaly, single gestation   Obesity affecting pregnancy   History of cesarean section complicating pregnancy   Gestational hypertension, third trimester  Additional problems: None    Discharge diagnosis: Term Pregnancy Delivered and Gestational Hypertension                                              Post partum procedures:None Augmentation: AROM, Pitocin and IP Foley Complications: None  Hospital course: Induction of Labor With Cesarean Section   44y.o. yo [id number] admitted to the hospital 12/15/2019 for induction of labor. Patient had a labor course significant for IOL secondary to gHTN. Patient received Foley bulb and Pitocin. AROM performed with minimal progression after 24 hours. FHT also notable for recurrent late decelerations. The patient went for cesarean section due to Elective Repeat and Non-Reassuring FHR. Delivery details are as follows: Membrane Rupture Time/Date: 3:27 PM ,12/16/2019   Delivery Method:C-Section, Low Transverse  Details of operation can be found in separate operative Note.  Patient had an uncomplicated postpartum course. She is ambulating, tolerating a regular diet, passing flatus, and urinating well.  Patient is discharged home in stable condition on 12/20/19.      Newborn Data: Birth date:12/17/2019  Birth time:1:47 AM  Gender:Female  Living status:Living  Apgars:8 ,9                                 Magnesium Sulfate received: No BMZ received: No Rhophylac:No MMR:No T-DaP:Ordered Flu: No Transfusion:No  Physical exam  Vitals:   12/19/19  0733 12/19/19 1622 12/19/19 2059 12/20/19 0616  BP: 135/88 129/78 (!) 142/89 128/81  Pulse: 89 88 99 86  Resp: '18 20  16  ' Temp: 98.2 F (36.8 C) 98 F (36.7 C) 98.8 F (37.1 C) 98.6 F (37 C)  TempSrc: Oral  Oral Oral  SpO2:   100% 100%  Weight:      Height:       General: alert, cooperative and no distress Lochia: appropriate Uterine Fundus: firm Incision: Dressing is clean, dry, and intact DVT Evaluation: No evidence of DVT seen on physical exam. Negative Homan's sign. Labs: Lab Results  Component Value Date   WBC 10.7 (H) 12/16/2019   HGB 11.7 (L) 12/16/2019   HCT 36.7 12/16/2019   MCV 84.0 12/16/2019   PLT 180 12/16/2019   CMP Latest Ref Rng & Units 12/15/2019  Glucose 70 - 99 mg/dL 128(H)  BUN 6 - 20 mg/dL 9  Creatinine 0.44 - 1.00 mg/dL 0.63  Sodium 135 - 145 mmol/L 134(L)  Potassium 3.5 - 5.1 mmol/L 3.7  Chloride 98 - 111 mmol/L 106  CO2 22 - 32 mmol/L 19(L)  Calcium 8.9 - 10.3 mg/dL 8.7(L)  Total Protein 6.5 - 8.1 g/dL 6.6  Total Bilirubin 0.3 - 1.2 mg/dL 0.8  Alkaline Phos 38 - 126 U/L 115  AST 15 - 41 U/L 18  ALT 0 - 44 U/L 18  Edinburgh Score: No flowsheet data found.   After visit meds:  Allergies as of 12/20/2019      Reactions   Bee Pollen Swelling   Savella [milnacipran]    Tomato Itching, Swelling   Citrus Rash   Lyrica [pregabalin] Rash   Neurontin [gabapentin] Swelling, Rash   Strawberry Extract Itching, Rash      Medication List    STOP taking these medications   acetaminophen 325 MG tablet Commonly known as: Tylenol Replaced by: acetaminophen 160 MG/5ML solution   aspirin 81 MG tablet   Blood Pressure Kit Chemical engineer Maternity Supp Lg Misc   Comfort Fit Maternity Supp Sm Misc   ferrous sulfate 325 (65 FE) MG tablet   multivitamin Chew chewable tablet   zolpidem 5 MG tablet Commonly known as: AMBIEN     TAKE these medications   acetaminophen 160 MG/5ML solution Commonly known as: TYLENOL Take 20.3 mLs (650  mg total) by mouth every 6 (six) hours as needed for mild pain (temperature > 101.5.). Replaces: acetaminophen 325 MG tablet   cetirizine HCl 5 MG/5ML Soln Commonly known as: Zyrtec Take 5 mLs (5 mg total) by mouth daily. What changed: how much to take   ibuprofen 100 MG/5ML suspension Commonly known as: ADVIL Take 40 mLs (800 mg total) by mouth every 6 (six) hours.   oxyCODONE 5 MG immediate release tablet Commonly known as: Oxy IR/ROXICODONE Take 1 tablet (5 mg total) by mouth every 4 (four) hours as needed for up to 5 days for moderate pain.   senna-docusate 8.6-50 MG tablet Commonly known as: Senokot-S Take 2 tablets by mouth daily. Start taking on: Dec 21, 2019   simethicone 80 MG chewable tablet Commonly known as: MYLICON Chew 1 tablet (80 mg total) by mouth 3 (three) times daily after meals.        Discharge home in stable condition Infant Feeding: Bottle and Breast Infant Disposition:home with mother Discharge instruction: per After Visit Summary and Postpartum booklet. Activity: Advance as tolerated. Pelvic rest for 6 weeks.  Diet: routine diet Future Appointments: Future Appointments  Date Time Provider [place]  12/28/2019  3:00 PM [facility] NURSE [facility]  12/28/2019  3:00 PM [facility] US1 [facility] [place]   Follow up Visit:   Please schedule this patient for a Virtual postpartum visit in 4 weeks with the following provider: Any provider. Additional Postpartum F/U:Incision check 1 week and BP check 1 week  High risk pregnancy complicated by: HTN Delivery mode:  C-Section, Low Transverse  Anticipated Birth Control:  BTL done PP   Mallie Snooks, MSN, CNM Certified Nurse Midwife, [employer] for [employer], [place] [facility] 12/20/19 9:18 AM

## 2019-12-17 NOTE — Care Management Important Message (Signed)
Important Message  Patient Details  Name: Alexandria Boyle MRN: [id number] Date of Birth: [birth year]   Medicare Important Message Given:  Yes     Shelda Altes 12/17/2019, 9:28 AM

## 2019-12-18 ENCOUNTER — Encounter (HOSPITAL_COMMUNITY): Payer: Self-pay | Admitting: Obstetrics and Gynecology

## 2019-12-18 MED ORDER — ACETAMINOPHEN 160 MG/5ML PO SOLN
650.0000 mg | Freq: Four times a day (QID) | ORAL | Status: DC | PRN
  Administered 2019-12-19: 325 mg via ORAL
  Filled 2019-12-18: qty 20.3

## 2019-12-18 MED ORDER — IBUPROFEN 800 MG PO TABS: 800.0000 mg | ORAL_TABLET | Freq: Four times a day (QID) | ORAL | Status: DC

## 2019-12-18 MED ORDER — KETOROLAC TROMETHAMINE 30 MG/ML IJ SOLN: 30.0000 mg | Freq: Four times a day (QID) | INTRAMUSCULAR | Status: DC

## 2019-12-18 MED ORDER — IBUPROFEN 100 MG/5ML PO SUSP
800.0000 mg | Freq: Four times a day (QID) | ORAL | Status: DC
  Administered 2019-12-18 (×2): 600 mg via ORAL
  Administered 2019-12-18: 800 mg via ORAL
  Administered 2019-12-19 (×2): 600 mg via ORAL
  Administered 2019-12-20: 800 mg via ORAL
  Administered 2019-12-20: 600 mg via ORAL
  Filled 2019-12-18 (×8): qty 40

## 2019-12-18 NOTE — Progress Notes (Addendum)
Post op Day 1 S/p rLTCS 12/17/2019  Subjective: up ad lib, voiding, tolerating PO and + flatus. Patient states she was not in any pain and so declined pain medicine. Then she performed multiple independent tasks (ADLs, infant care) and is now very uncomfortable. States she does not currently have pain but finds the bed very uncomfortable and difficulty to move to and from.   Patient originally planned circumcision but now declines. States she may reconsider tomorrow but feels that her baby has been "poked and prodded enough".  Objective: Blood pressure 128/84, pulse 96, temperature 98.7 F (37.1 C), temperature source Oral, resp. rate 18, height 5' (1.524 m), weight 90.3 kg, last menstrual period 04/01/2019, SpO2 100 %, unknown if currently breastfeeding.  Physical Exam:  General: alert, cooperative, appears stated age and no distress Lochia: appropriate Uterine Fundus: firm Incision: not visible, dressing clean, dry and intact (replaced last night) DVT Evaluation: No evidence of DVT seen on physical exam.  Recent Labs    12/16/19 1307 12/17/19 0730  HGB 11.7* 11.0*  HCT 36.7 34.4*    Assessment/Plan: --Reviewed MAR and confirmed patient has pain medication available. Encouraged her to take as prescribed to allow ADLS --Continue ambulating, ask for help out of bed PRN --Doing well postoperatively   LOS: 3 days   Darlina Rumpf, CNM 12/18/2019, [phone]

## 2019-12-18 NOTE — Progress Notes (Signed)
CSW acknowledged consult and attempted to meet with MOB. However, MOB declined to see CSW stating she is tired and would like to go to sleep. CSW will meet with MOB at a later time.  Alexandria Boyle D. Lissa Morales, MSW, [facility] Clinical Social Worker [phone]

## 2019-12-19 NOTE — Plan of Care (Signed)
  Problem: Clinical Measurements: Goal: Ability to maintain clinical measurements within normal limits will improve Outcome: Completed/Met Goal: Diagnostic test results will improve Outcome: Completed/Met Goal: Respiratory complications will improve Outcome: Completed/Met Goal: Cardiovascular complication will be avoided Outcome: Completed/Met   Problem: Activity: Goal: Risk for activity intolerance will decrease Outcome: Completed/Met   Problem: Nutrition: Goal: Adequate nutrition will be maintained Outcome: Completed/Met   Problem: Safety: Goal: Ability to remain free from injury will improve Outcome: Completed/Met   Problem: Role Relationship: Goal: Will demonstrate positive interactions with the child Outcome: Completed/Met

## 2019-12-19 NOTE — Progress Notes (Signed)
POSTPARTUM PROGRESS NOTE  Post Partum Day 1 Subjective:  Alexandria Boyle is a 44 y.o. [id number] [redacted]w[redacted]d s/p rLTCS.  No acute events overnight.  Pt denies problems with ambulating, voiding or po intake.  She denies nausea or vomiting.  Pain is poorly controlled.  She has had flatus. She has had bowel movement.  Lochia Moderate. She continues to note lower extremity and mild hand swelling.   Objective: Blood pressure 135/88, pulse 89, temperature 98.2 F (36.8 C), temperature source Oral, resp. rate 18, height 5' (1.524 m), weight 90.3 kg, last menstrual period 04/01/2019, SpO2 100 %, unknown if currently breastfeeding.  Physical Exam:  General: alert, cooperative and no distress Lochia:normal flow Chest: CTAB Heart: RRR no m/r/g Abdomen: +BS, soft, nontender,  Uterine Fundus: tender to palpation DVT Evaluation: No calf swelling or tenderness Extremities: 3+ edema  Recent Labs    12/16/19 1307 12/17/19 0730  HGB 11.7* 11.0*  HCT 36.7 34.4*    Assessment/Plan:  ASSESSMENT: Alexandria Boyle is a 44 y.o. [id number] [redacted]w[redacted]d s/p rLTCS.  Plan for discharge tomorrow  # abdominal tenderness likely secondary to not taking pain medication.  Low suspicion for endometritis. Will continue to monitor #gHTN: well controlled. No medication at this time. Continue to monitor.    LOS: 4 days   Matilde Haymaker, MD 12/19/2019, 12:07 PM

## 2019-12-19 NOTE — Plan of Care (Signed)
  Problem: Education: Goal: Knowledge of General Education information will improve Description: Including pain rating scale, medication(s)/side effects and non-pharmacologic comfort measures Outcome: Completed/Met   Problem: Clinical Measurements: Goal: Ability to maintain clinical measurements within normal limits will improve Outcome: Completed/Met Goal: Will remain free from infection Outcome: Completed/Met Goal: Diagnostic test results will improve Outcome: Completed/Met Goal: Respiratory complications will improve Outcome: Completed/Met Goal: Cardiovascular complication will be avoided Outcome: Completed/Met   Problem: Activity: Goal: Risk for activity intolerance will decrease Outcome: Completed/Met   Problem: Nutrition: Goal: Adequate nutrition will be maintained Outcome: Completed/Met   Problem: Elimination: Goal: Will not experience complications related to bowel motility Outcome: Completed/Met   Problem: Safety: Goal: Ability to remain free from injury will improve Outcome: Completed/Met   Problem: Role Relationship: Goal: Will demonstrate positive interactions with the child Outcome: Completed/Met

## 2019-12-19 NOTE — Progress Notes (Signed)
CSW visited MOB at bedisde to discuss Edinburgh score of 10. MOB saw CSW previously for assessment and support. Alexandria Boyle came in after visit. During this visit MOB denied any new concerns or needs. MOB denied any SI, HI, or domestic violence. MOB requested to go to sleep.   No additional interventions needed. CSW CSW at request of MOB or behavior health concerns arise. No barriers to infant discharging with MOB.   Erman Thum D. Lissa Morales, MSW, [facility] Clinical Social Worker [phone]

## 2019-12-19 NOTE — Lactation Note (Signed)
This note was copied from a baby's chart. Lactation Consultation Note  Patient Name: Alexandria Boyle [id number] Date: 12/19/2019    Infant recently fed.    Mom is desiring to give her baby her milk and tells Stanley she is finally seeing the "clear milk".   Mom asked LC what she could put on her breast for dryness.  Upon assessment, LC noted healthy, normal breast/nipple tissue.  Breast felt heavy.  LC reviewed hand expression with mom and she was able to hand express.  LC encouraged mom to do this several times a day in addition to pumping 8 times a day.   Mom tells LC she's been putting hot towels on her breasts.  She was doing this bc someone told her it would help.  LC explained milk volume changes and the suggested pumping and putting infant to breast instead of hot towel applications.  She feels infant needs her milk.  LC encouraged mom to hand express, put infant to breast, then pump following the BF.  Mom had several questions; all were answered.  LC reviewed importance of pumping to establish milk supply if infant wasn't regularly feeding at the breast.  Mom understands.    LC encouraged mom to call out when infant is ready to feed again if assistance is needed with feeding or pumping.   Maternal Data    Feeding Feeding Type: Bottle Fed - Formula  LATCH Score                   Interventions    Lactation Tools Discussed/Used     Consult Status      Ferne Coe [facility] 12/19/2019, 10:09 PM

## 2019-12-19 NOTE — Plan of Care (Signed)
  Problem: Clinical Measurements: Goal: Ability to maintain clinical measurements within normal limits will improve Outcome: Completed/Met Goal: Diagnostic test results will improve Outcome: Completed/Met Goal: Respiratory complications will improve Outcome: Completed/Met Goal: Cardiovascular complication will be avoided Outcome: Completed/Met   Problem: Activity: Goal: Risk for activity intolerance will decrease Outcome: Completed/Met   Problem: Nutrition: Goal: Adequate nutrition will be maintained Outcome: Completed/Met   Problem: Safety: Goal: Ability to remain free from injury will improve Outcome: Completed/Met

## 2019-12-20 ENCOUNTER — Ambulatory Visit: Payer: Self-pay

## 2019-12-20 LAB — SURGICAL PATHOLOGY

## 2019-12-20 MED ORDER — SIMETHICONE 80 MG PO CHEW: 80.0000 mg | CHEWABLE_TABLET | Freq: Three times a day (TID) | ORAL | 0 refills | Status: DC

## 2019-12-20 MED ORDER — IBUPROFEN 100 MG/5ML PO SUSP: 800.0000 mg | Freq: Four times a day (QID) | ORAL | 0 refills | Status: DC

## 2019-12-20 MED ORDER — CETIRIZINE HCL 5 MG/5ML PO SOLN: 5.0000 mg | Freq: Every day | ORAL | 0 refills | Status: DC

## 2019-12-20 MED ORDER — SENNOSIDES-DOCUSATE SODIUM 8.6-50 MG PO TABS: 2.0000 | ORAL_TABLET | ORAL | 0 refills | Status: DC

## 2019-12-20 MED ORDER — OXYCODONE HCL 5 MG PO TABS: 5.0000 mg | ORAL_TABLET | ORAL | 0 refills | Status: DC | PRN

## 2019-12-20 MED ORDER — ACETAMINOPHEN 160 MG/5ML PO SOLN: 650.0000 mg | Freq: Four times a day (QID) | ORAL | 0 refills | Status: DC | PRN

## 2019-12-20 NOTE — Care Management Important Message (Signed)
Important Message  Patient Details  Name: LAPORCHE ARCHIBEQUE MRN: [id number] Date of Birth: 10/19/1975   Medicare Important Message Given:  Yes     Shelda Altes 12/20/2019, 4:53 PM

## 2019-12-20 NOTE — Lactation Note (Signed)
This note was copied from a baby's chart. Lactation Consultation Note  Patient Name: Alexandria Boyle [id number] Date: 12/20/2019   I stopped into Mom's room, but Mom was in the bathroom. When asked, infant's older sibling said that he didn't seem to gulp when using the purple extra-slow flow nipples, so I provided additional ones. Brooke, RN was given an update.  Matthias Hughs [facility] 12/20/2019, 3:23 PM

## 2019-12-20 NOTE — Discharge Instructions (Signed)
General Post-Operative Instructions You may expect to feel dizzy, weak, and drowsy for as long as 24 hours after receiving the medicine that made you sleep (anesthetic).  Do not drive a car, ride a bicycle, participate in physical activities, or take public transportation until you are done taking narcotic pain medicines or as directed by your doctor.  Do not drink alcohol or take tranquilizers.  Do not take medicine that has not been prescribed by your doctor.  Do not sign important papers or make important decisions while on narcotic pain medicines.  Have a responsible person with you.  CARE OF INCISION  Keep incision clean and dry. Take showers instead of baths until your doctor gives you permission to take baths.  Avoid heavy lifting (more than 10 pounds/4.5 kilograms), pushing, or pulling.  Avoid activities that may risk injury to your surgical site.  No sexual intercourse or placement of anything in the vagina for 6 weeks or as instructed by your doctor. If you have tubes coming from the wound site, check with your doctor regarding appropriate care of the tubes. Only take prescription or over-the-counter medicines  for pain, discomfort, or fever as directed by your doctor. Do not take aspirin. It can make you bleed. Take medicines (antibiotics) that kill germs if they are prescribed for you.  Call the office or go to the MAU if:  You feel sick to your stomach (nauseous).  You start to throw up (vomit).  You have trouble eating or drinking.  You have an oral temperature above 101.  You have constipation that is not helped by adjusting diet or increasing fluid intake. Pain medicines are a common cause of constipation.  You have any other concerns. SEEK IMMEDIATE MEDICAL CARE IF:  You have persistent dizziness.  You have difficulty breathing or a congested sounding (croupy) cough.  You have an oral temperature above 102.5, not controlled by medicine.  There is increasing pain or  tenderness near or in the surgical site.

## 2019-12-20 NOTE — Lactation Note (Signed)
This note was copied from a baby's chart. Lactation Consultation Note  Patient Name: Alexandria Boyle [id number] Date: 12/20/2019 Reason for consult: Follow-up assessment  LC Follow Up Visit:  Mother had a question regarding "mixing her milk."  When I arrived mother was attempting to figure out how to mix her milk with the formula.  She informed me that she was told the milk could be mixed.  I suggested mother feed back any EBM she obtains from pumping to baby first, rather than mixing her EBM with formula.  I explained that her milk is valuable and we would like to be sure baby consumes her EBM first.  She had two bottles with 10 mls each of EBM.  I showed her that we could mix the EBM for a combined total of 20 mls.  Instructed her to feed this to her son.  After he consumes all of the milk she should give him the formula supplementation.  Mother verbalized understanding.  RN updated.   Maternal Data    Feeding Nipple Type: Extra Slow Flow  LATCH Score                   Interventions    Lactation Tools Discussed/Used     Consult Status Consult Status: Follow-up Date: 12/21/19 Follow-up type: In-patient    Marisella Puccio R Sanayah Munro 12/20/2019, 6:20 PM

## 2019-12-20 NOTE — Lactation Note (Signed)
This note was copied from a baby's chart. Lactation Consultation Note  Patient Name: Alexandria Boyle [id number] Date: 12/20/2019  Infant is 64 hrs old & has gained 24 g over since his last weight. Mom is giving formula with a bottle, except for giving some EBM via curved-tip syringe (documented once).   I informed Mom about our [facility] loaner program. Mom has an appt with [facility] on the 20th. I have not sent a referral to [facility] b/c although she says she wants to pump, she has done so rarely. Mom cites being in pain (Mom's chart notes to have complex regional pain syndrome I) & being busy/having a lot to do as why she has not pumped.   Mom has no breast complaints. Mom reports that the yellow slow-flow nipples are too fast; I provided some purple extra-slow flow nipples.   I spoke w/Dr. Nevada Crane about my impressions of mother's thought processes (which seem non-linear at times). Social work has interacted with mom.   Matthias Hughs [facility] 12/20/2019, 11:53 AM

## 2019-12-24 ENCOUNTER — Inpatient Hospital Stay (EMERGENCY_DEPARTMENT_HOSPITAL)
Admission: AD | Admit: 2019-12-24 | Discharge: 2019-12-24 | Disposition: A | Discharge status: Home / Self Care | Payer: Medicare Other | Source: Home / Self Care | Attending: Obstetrics and Gynecology | Admitting: Obstetrics and Gynecology

## 2019-12-24 ENCOUNTER — Other Ambulatory Visit: Payer: Self-pay

## 2019-12-24 ENCOUNTER — Encounter (HOSPITAL_COMMUNITY): Payer: Self-pay | Admitting: Obstetrics and Gynecology

## 2019-12-24 ENCOUNTER — Inpatient Hospital Stay (HOSPITAL_COMMUNITY): Payer: Medicare Other

## 2019-12-24 ENCOUNTER — Ambulatory Visit: Payer: Medicare Other

## 2019-12-24 ENCOUNTER — Telehealth: Payer: Self-pay | Admitting: Obstetrics and Gynecology

## 2019-12-24 DIAGNOSIS — S301XXA Contusion of abdominal wall, initial encounter: Secondary | ICD-10-CM

## 2019-12-24 DIAGNOSIS — O165 Unspecified maternal hypertension, complicating the puerperium: Secondary | ICD-10-CM

## 2019-12-24 DIAGNOSIS — O9089 Other complications of the puerperium, not elsewhere classified: Secondary | ICD-10-CM

## 2019-12-24 DIAGNOSIS — O1415 Severe pre-eclampsia, complicating the puerperium: Secondary | ICD-10-CM | POA: Diagnosis not present

## 2019-12-24 DIAGNOSIS — O902 Hematoma of obstetric wound: Secondary | ICD-10-CM

## 2019-12-24 DIAGNOSIS — R030 Elevated blood-pressure reading, without diagnosis of hypertension: Secondary | ICD-10-CM | POA: Diagnosis not present

## 2019-12-24 LAB — COMPREHENSIVE METABOLIC PANEL
ALT: 56 U/L — ABNORMAL HIGH (ref 0–44)
AST: 30 U/L (ref 15–41)
Albumin: 2.8 g/dL — ABNORMAL LOW (ref 3.5–5.0)
Alkaline Phosphatase: 105 U/L (ref 38–126)
Anion gap: 12 (ref 5–15)
BUN: 7 mg/dL (ref 6–20)
CO2: 23 mmol/L (ref 22–32)
Calcium: 8.9 mg/dL (ref 8.9–10.3)
Chloride: 106 mmol/L (ref 98–111)
Creatinine, Ser: 0.63 mg/dL (ref 0.44–1.00)
GFR calc Af Amer: >60 mL/min (ref >60)
GFR calc non Af Amer: >60 mL/min (ref >60)
Glucose, Bld: 94 mg/dL (ref 70–99)
Potassium: 3.8 mmol/L (ref 3.5–5.1)
Sodium: 141 mmol/L (ref 135–145)
Total Bilirubin: 1 mg/dL (ref 0.3–1.2)
Total Protein: 6.6 g/dL (ref 6.5–8.1)

## 2019-12-24 LAB — CBC
HCT: 35.3 % — ABNORMAL LOW (ref 36.0–46.0)
Hemoglobin: 11.2 g/dL — ABNORMAL LOW (ref 12.0–15.0)
MCH: 27.1 pg (ref 26.0–34.0)
MCHC: 31.7 g/dL (ref 30.0–36.0)
MCV: 85.5 fL (ref 80.0–100.0)
Platelets: 299 10*3/uL (ref 150–400)
RBC: 4.13 MIL/uL (ref 3.87–5.11)
RDW: 15.2 % (ref 11.5–15.5)
WBC: 8.5 10*3/uL (ref 4.0–10.5)
nRBC: 0 % (ref 0.0–0.2)

## 2019-12-24 MED ORDER — LABETALOL HCL 5 MG/ML IV SOLN
20.0000 mg | INTRAVENOUS | Status: DC | PRN
  Administered 2019-12-24: 20 mg via INTRAVENOUS
  Filled 2019-12-24: qty 4

## 2019-12-24 MED ORDER — NIFEDIPINE 10 MG PO CAPS
10.0000 mg | ORAL_CAPSULE | Freq: Once | ORAL | Status: AC
  Administered 2019-12-24: 10 mg via ORAL
  Filled 2019-12-24: qty 1

## 2019-12-24 MED ORDER — LABETALOL HCL 5 MG/ML IV SOLN: 40.0000 mg | INTRAVENOUS | Status: DC | PRN

## 2019-12-24 MED ORDER — ENALAPRIL MALEATE 5 MG PO TABS
10.0000 mg | ORAL_TABLET | Freq: Once | ORAL | Status: AC
  Administered 2019-12-24: 10 mg via ORAL
  Filled 2019-12-24 (×2): qty 2

## 2019-12-24 MED ORDER — LABETALOL HCL 5 MG/ML IV SOLN: 80.0000 mg | INTRAVENOUS | Status: DC | PRN

## 2019-12-24 MED ORDER — HYDRALAZINE HCL 20 MG/ML IJ SOLN: 10.0000 mg | INTRAMUSCULAR | Status: DC | PRN

## 2019-12-24 MED ORDER — ENALAPRIL MALEATE 10 MG PO TABS: 10.0000 mg | ORAL_TABLET | Freq: Every day | ORAL | 2 refills | Status: DC

## 2019-12-24 NOTE — MAU Note (Signed)
Had a virtual appt today, told her to check BP, BP was elevated. Not on BP med.  Denies HA, visual changes, epigastric pain.  Slight increase in swelling in left foot.

## 2019-12-24 NOTE — Telephone Encounter (Signed)
Patient called, she was thinking she had a virtual visit today.  She was scheduled for an incision check.  I have rescheduled this appointment to next week.    Patient states her incision is dry and no issues. Her BP was 150/95.  Patient denies headache or visual changes.  Advised patient what to watch for and that she should go to the hospital if any symptoms arise and is her repeated BP is still elevated.

## 2019-12-24 NOTE — Progress Notes (Signed)
V. Stann Mainland, CNM wants BP taken in approx 5 minutes, states BP elevation may have been caused because abdominal dressing was removed just BP taken, pt stated drsg removal was painful.

## 2019-12-24 NOTE — Discharge Instructions (Signed)
Hematoma A hematoma is a collection of blood. A hematoma can happen:  Under the skin.  In an organ.  In a body space.  In a joint space.  In other tissues. The blood can thicken (clot) to form a lump that you can see and feel. The lump is often hard and may become sore and tender. The lump can be very small or very big. Most hematomas get better in a few days to weeks. However, some hematomas may be serious and need medical care. What are the causes? This condition is caused by:  An injury.  Blood that leaks under the skin.  Problems from surgeries.  Medical conditions that cause bleeding or bruising. What increases the risk? You are more likely to develop this condition if:  You are an older adult.  You use medicines that thin your blood. What are the signs or symptoms? Symptoms depend on where the hematoma is in your body.  If the hematoma is under the skin, there is: ? A firm lump on the body. ? Pain and tenderness in the area. ? Bruising. The skin above the lump may be blue, dark blue, purple-red, or yellowish.  If the hematoma is deep in the tissues or body spaces, there may be: ? Blood in the stomach. This may cause pain in the belly (abdomen), weakness, passing out (fainting), and shortness of breath. ? Blood in the head. This may cause a headache, weakness, trouble speaking or understanding speech, or passing out. How is this diagnosed? This condition is diagnosed based on:  Your medical history.  A physical exam.  Imaging tests, such as ultrasound or CT scan.  Blood tests. How is this treated? Treatment depends on the cause, size, and location of the hematoma. Treatment may include:  Doing nothing. Many hematomas go away on their own without treatment.  Surgery or close monitoring. This may be needed for large hematomas or hematomas that affect the body's organs.  Medicines. These may be given if a medical condition caused the hematoma. Follow these  instructions at home: Managing pain, stiffness, and swelling   If told, put ice on the area. ? Put ice in a plastic bag. ? Place a towel between your skin and the bag. ? Leave the ice on for 20 minutes, 2-3 times a day for the first two days.  If told, put heat on the affected area after putting ice on the area for two days. Use the heat source that your doctor tells you to use. This could be a moist heat pack or a heating pad. To do this: ? Place a towel between your skin and the heat source. ? Leave the heat on for 20-30 minutes. ? Remove the heat if your skin turns bright red. This is very important if you are unable to feel pain, heat, or cold. You may have a greater risk of getting burned.  Raise (elevate) the affected area above the level of your heart while you are sitting or lying down.  Wrap the affected area with an elastic bandage, if told by your doctor. Do not wrap the bandage too tightly.  If your hematoma is on a leg or foot and is painful, your doctor may give you crutches. Use them as told by your doctor. General instructions  Take over-the-counter and prescription medicines only as told by your doctor.  Keep all follow-up visits as told by your doctor. This is important. Contact a doctor if:  You have a fever.    The swelling or bruising gets worse.  You start to get more hematomas. Get help right away if:  Your pain gets worse.  Your pain is not getting better with medicine.  Your skin over the hematoma breaks or starts to bleed.  Your hematoma is in your chest or belly and you: ? Pass out. ? Feel weak. ? Become short of breath.  You have a hematoma on your scalp that is caused by a fall or injury, and you: ? Have a headache that gets worse. ? Have trouble speaking or understanding speech. ? Become less alert or you pass out. Summary  A hematoma is a collection of blood in any part of your body.  Most hematomas get better on their own in a few days  to weeks. Some may need medical care.  Follow instructions from your doctor about how to care for your hematoma.  Contact a doctor if the swelling or bruising gets worse, or if you are short of breath. This information is not intended to replace advice given to you by your health care provider. Make sure you discuss any questions you have with your health care provider. Document Revised: 12/25/2017 Document Reviewed: 12/25/2017 Elsevier Patient Education  2020 [employer]  

## 2019-12-24 NOTE — MAU Provider Note (Signed)
Chief Complaint  Patient presents with  . Hypertension  . Foot Swelling     First Provider Initiated Contact with Patient 12/24/19 1710     S: Alexandria Boyle  is a 44 y.o. y.o. year old G18P2012 female who is 1 week PP from a rLTCS on 12/17/19 who presents to MAU with elevated blood pressures. Patient has a hx of GHTN and currently not on any blood pressure medication. She reports having a BP of 150/95 at home on babyscripts. She denies HA, visual changes, epigastric or RUQ.   She reports over the past 1-2 days she started noticing increased abdominal pain and pain with position changes.Rates pain 5/10- has taken ibuprofen with some relief. She denies increased vaginal bleeding.   O:  Patient Vitals for the past 24 hrs:  BP Temp Temp src Pulse Resp SpO2  12/24/19 1946 133/71 -- -- 79 -- --  12/24/19 1830 (!) 154/91 -- -- 74 -- --  12/24/19 1816 (!) 161/92 -- -- 76 -- --  12/24/19 1801 (!) 174/96 -- -- 73 -- --  12/24/19 1750 (!) 159/98 -- -- 76 -- --  12/24/19 1724 (!) 156/95 -- -- 73 -- --  12/24/19 1715 (!) 162/94 -- -- 76 -- --  12/24/19 1701 (!) 168/88 -- -- 84 -- --  12/24/19 1646 (!) 159/90 -- -- 76 -- --  12/24/19 1634 (!) 152/107 -- -- 84 -- --  12/24/19 1612 (!) 158/97 99.1 F (37.3 C) Oral 90 18 98 %   General: NAD Heart: Regular rate Lungs: Normal rate and effort Abd: superior firmness and extreme tenderness that is superficial. Incision site healing well with no drainage or exudate  Extremities: no pedal edema Neuro: 2+ deep tendon reflexes, No clonus     Results for orders placed or performed during the hospital encounter of 12/24/19 (from the past 24 hour(s))  CBC     Status: Abnormal   Collection Time: 12/24/19  5:09 PM  Result Value Ref Range   WBC 8.5 4.0 - 10.5 K/uL   RBC 4.13 3.87 - 5.11 MIL/uL   Hemoglobin 11.2 (L) 12.0 - 15.0 g/dL   HCT 35.3 (L) 36.0 - 46.0 %   MCV 85.5 80.0 - 100.0 fL   MCH 27.1 26.0 - 34.0 pg   MCHC 31.7 30.0 - 36.0 g/dL   RDW  15.2 11.5 - 15.5 %   Platelets 299 150 - 400 K/uL   nRBC 0.0 0.0 - 0.2 %  Comprehensive metabolic panel     Status: Abnormal   Collection Time: 12/24/19  5:09 PM  Result Value Ref Range   Sodium 141 135 - 145 mmol/L   Potassium 3.8 3.5 - 5.1 mmol/L   Chloride 106 98 - 111 mmol/L   CO2 23 22 - 32 mmol/L   Glucose, Bld 94 70 - 99 mg/dL   BUN 7 6 - 20 mg/dL   Creatinine, Ser 0.63 0.44 - 1.00 mg/dL   Calcium 8.9 8.9 - 10.3 mg/dL   Total Protein 6.6 6.5 - 8.1 g/dL   Albumin 2.8 (L) 3.5 - 5.0 g/dL   AST 30 15 - 41 U/L   ALT 56 (H) 0 - 44 U/L   Alkaline Phosphatase 105 38 - 126 U/L   Total Bilirubin 1.0 0.3 - 1.2 mg/dL   GFR calc non Af Amer >60 >60 mL/min   GFR calc Af Amer >60 >60 mL/min   Anion gap 12 5 - 15   MDM  Orders Placed This Encounter  Procedures  . US PELVIS (TRANSABDOMINAL ONLY)  . CBC  . Comprehensive metabolic panel  . Measure blood pressure  . Notify Physician  . Insert peripheral IV   Meds ordered this encounter  Medications  . AND Linked Order Group   . labetalol (NORMODYNE) injection 20 mg   . labetalol (NORMODYNE) injection 40 mg   . labetalol (NORMODYNE) injection 80 mg   . hydrALAZINE (APRESOLINE) injection 10 mg  . enalapril (VASOTEC) tablet 10 mg  . NIFEdipine (PROCARDIA) capsule 10 mg   Treatments in MAU included IV labetalol 20mg , given at 1829 Ultrasound ordered to assess for hematoma due to abdominal examination   Ultrasound reviewed: US PELVIS (TRANSABDOMINAL ONLY)  Result Date: 12/24/2019 CLINICAL DATA:  44 year old female status post C-section 6 days ago with severe postpartum pain. EXAM: TRANSABDOMINAL ULTRASOUND OF PELVIS TECHNIQUE: Transabdominal ultrasound examination of the pelvis was performed including evaluation of the uterus, ovaries, adnexal regions, and pelvic cul-de-sac. COMPARISON:  Pelvis ultrasound 04/24/2017. Recent Ob ultrasound 11/03/2019. FINDINGS: Uterus Measurements: 16.4 x 8.3 x 11.0 cm = volume: 788 mL. Anteverted  uterus with at least 4 uterine fibroids ranging from 25 mm to 55 mm diameter. The largest is at the right fundus, intramural (image 16). A smaller 3.5 cm subserosal fibroid is noted at the left fundus (image 20). There is also a 2.5 cm subserosal fibroid on image 23. Ventral to the uterus and elongated 10.9 x 8.4 x 2.3 cm area of complex hypoechogenicity without vascularity is noted and probably postoperative hematoma or seroma (images 52-55). And this might be contiguous with the myometrium at the level of the lower uterine segment where a saccular area of hypoechogenicity is seen on image 45. Endometrium Complex fluid within the lower endometrial canal up to 31 mm diameter, but no vascularity (image 39). Smaller volume fluid elsewhere in the endometrium. Near the fundus the endometrium is 9 mm (image 31). Right ovary Measurements: 2.2 x 2.0 x 2.2 cm = volume: 5 mL. Normal appearance/no adnexal mass. Left ovary Measurements: 3.3 x 1.3 x 2.0 cm = volume: 4 mL. Normal appearance/no adnexal mass. Other findings:  No free fluid is identified. IMPRESSION: 1. Elongated roughly 11 x 2 cm area of postoperative hematoma or seroma ventral to the uterus at the level of C-section (image 52), and might be contiguous with the uterine incision (image 45). 2. Moderate volume of complex appearing fluid in the lower endometrial canal, but no vascular elements identified to suggest retained products of conception. 3. Superimposed fibroid uterus, with at least 4 fibroids ranging from 2.5 to 5.5 cm. 4. Normal ovaries and no pelvic free fluid identified. Electronically Signed   By: Genevie Ann M.D.   On: 12/24/2019 19:18   Consult with Dr Kennon Rounds on assessment and management. Dr Kennon Rounds recommends oral medication in MAU and discharge home.   Procardia and enalapril given in MAU prior to discharge home  Repeat BP 133/71 after medication.  Rx for enalapril sent to pharmacy of choice, discussed patient need to start medication in morning  since given dose tonight prior to discharge.  Follow up as scheduled in the office for BP check and incision check on Monday  Discussed reasons to return to MAU. Strict PEC precautions. Pt stable at time of discharge.   A:  1. Postpartum hypertension   2. Postpartum complication   3. Hematoma of abdominal wall, initial encounter     P: Discharge home in stable condition per consult with Dr  Pratt Preeclampsia precautions. Follow-up for blood pressure check in 3 days at your doctor's office sooner as needed if symptoms worsen. Return to maternity admissions as needed in emergencies Rx for enalapril sent to pharmacy of choice   Herby Abraham 12/24/2019 7:58 PM

## 2019-12-25 ENCOUNTER — Inpatient Hospital Stay (HOSPITAL_COMMUNITY)
Admission: AD | Admit: 2019-12-25 | Discharge: 2019-12-27 | DRG: 776 | Disposition: A | Discharge status: Home / Self Care | Payer: Medicare Other | Attending: Obstetrics and Gynecology | Admitting: Obstetrics and Gynecology

## 2019-12-25 ENCOUNTER — Other Ambulatory Visit: Payer: Self-pay

## 2019-12-25 ENCOUNTER — Encounter (HOSPITAL_COMMUNITY): Payer: Self-pay | Admitting: Obstetrics and Gynecology

## 2019-12-25 ENCOUNTER — Telehealth: Payer: Self-pay | Admitting: Student

## 2019-12-25 DIAGNOSIS — Z3480 Encounter for supervision of other normal pregnancy, unspecified trimester: Secondary | ICD-10-CM

## 2019-12-25 DIAGNOSIS — Z8759 Personal history of other complications of pregnancy, childbirth and the puerperium: Secondary | ICD-10-CM | POA: Diagnosis not present

## 2019-12-25 DIAGNOSIS — O1495 Unspecified pre-eclampsia, complicating the puerperium: Secondary | ICD-10-CM | POA: Diagnosis present

## 2019-12-25 DIAGNOSIS — O133 Gestational [pregnancy-induced] hypertension without significant proteinuria, third trimester: Secondary | ICD-10-CM | POA: Diagnosis present

## 2019-12-25 DIAGNOSIS — Z20822 Contact with and (suspected) exposure to covid-19: Secondary | ICD-10-CM | POA: Diagnosis present

## 2019-12-25 DIAGNOSIS — O34219 Maternal care for unspecified type scar from previous cesarean delivery: Secondary | ICD-10-CM | POA: Diagnosis present

## 2019-12-25 DIAGNOSIS — O35EXX0 Maternal care for other (suspected) fetal abnormality and damage, fetal genitourinary anomalies, not applicable or unspecified: Secondary | ICD-10-CM

## 2019-12-25 DIAGNOSIS — O135 Gestational [pregnancy-induced] hypertension without significant proteinuria, complicating the puerperium: Secondary | ICD-10-CM | POA: Diagnosis not present

## 2019-12-25 DIAGNOSIS — O09529 Supervision of elderly multigravida, unspecified trimester: Secondary | ICD-10-CM

## 2019-12-25 DIAGNOSIS — R030 Elevated blood-pressure reading, without diagnosis of hypertension: Secondary | ICD-10-CM | POA: Diagnosis present

## 2019-12-25 DIAGNOSIS — O1415 Severe pre-eclampsia, complicating the puerperium: Secondary | ICD-10-CM | POA: Diagnosis present

## 2019-12-25 DIAGNOSIS — O99210 Obesity complicating pregnancy, unspecified trimester: Secondary | ICD-10-CM | POA: Diagnosis present

## 2019-12-25 DIAGNOSIS — O99215 Obesity complicating the puerperium: Secondary | ICD-10-CM | POA: Diagnosis not present

## 2019-12-25 LAB — CBC
HCT: 35.5 % — ABNORMAL LOW (ref 36.0–46.0)
Hemoglobin: 11 g/dL — ABNORMAL LOW (ref 12.0–15.0)
MCH: 26.8 pg (ref 26.0–34.0)
MCHC: 31 g/dL (ref 30.0–36.0)
MCV: 86.4 fL (ref 80.0–100.0)
Platelets: 316 10*3/uL (ref 150–400)
RBC: 4.11 MIL/uL (ref 3.87–5.11)
RDW: 15.4 % (ref 11.5–15.5)
WBC: 8.8 10*3/uL (ref 4.0–10.5)
nRBC: 0 % (ref 0.0–0.2)

## 2019-12-25 LAB — CREATININE, SERUM
Creatinine, Ser: 0.59 mg/dL (ref 0.44–1.00)
GFR calc Af Amer: >60 mL/min (ref >60)
GFR calc non Af Amer: >60 mL/min (ref >60)

## 2019-12-25 LAB — SARS CORONAVIRUS 2 BY RT PCR (HOSPITAL ORDER, PERFORMED IN ~~LOC~~ HOSPITAL LAB): SARS Coronavirus 2: NEGATIVE

## 2019-12-25 MED ORDER — LABETALOL HCL 5 MG/ML IV SOLN: 40.0000 mg | INTRAVENOUS | Status: DC | PRN

## 2019-12-25 MED ORDER — LACTATED RINGERS IV SOLN: INTRAVENOUS | Status: DC

## 2019-12-25 MED ORDER — SIMETHICONE 80 MG PO CHEW
80.0000 mg | CHEWABLE_TABLET | Freq: Three times a day (TID) | ORAL | Status: DC
  Administered 2019-12-25 – 2019-12-27 (×4): 80 mg via ORAL
  Filled 2019-12-25 (×4): qty 1

## 2019-12-25 MED ORDER — LABETALOL HCL 5 MG/ML IV SOLN: 80.0000 mg | INTRAVENOUS | Status: DC | PRN

## 2019-12-25 MED ORDER — DIBUCAINE (PERIANAL) 1 % EX OINT
1.0000 "application " | TOPICAL_OINTMENT | CUTANEOUS | Status: DC | PRN
  Filled 2019-12-25: qty 28

## 2019-12-25 MED ORDER — MAGNESIUM SULFATE 40 GM/1000ML IV SOLN
2.0000 g/h | INTRAVENOUS | Status: AC
  Administered 2019-12-25 – 2019-12-26 (×2): 2 g/h via INTRAVENOUS
  Filled 2019-12-25 (×2): qty 1000

## 2019-12-25 MED ORDER — HYDRALAZINE HCL 20 MG/ML IJ SOLN: 10.0000 mg | INTRAMUSCULAR | Status: DC | PRN

## 2019-12-25 MED ORDER — LABETALOL HCL 5 MG/ML IV SOLN: 20.0000 mg | INTRAVENOUS | Status: DC | PRN

## 2019-12-25 MED ORDER — IBUPROFEN 100 MG/5ML PO SUSP
800.0000 mg | Freq: Four times a day (QID) | ORAL | Status: DC
  Administered 2019-12-25 – 2019-12-26 (×2): 200 mg via ORAL
  Filled 2019-12-25 (×5): qty 40

## 2019-12-25 MED ORDER — MENTHOL 3 MG MT LOZG
1.0000 | LOZENGE | OROMUCOSAL | Status: DC | PRN
  Filled 2019-12-25: qty 9

## 2019-12-25 MED ORDER — PRENATAL MULTIVITAMIN CH: 1.0000 | ORAL_TABLET | Freq: Every day | ORAL | Status: DC

## 2019-12-25 MED ORDER — ENALAPRIL MALEATE 5 MG PO TABS
10.0000 mg | ORAL_TABLET | Freq: Every day | ORAL | Status: DC
  Administered 2019-12-26 – 2019-12-27 (×2): 10 mg via ORAL
  Filled 2019-12-25 (×4): qty 2

## 2019-12-25 MED ORDER — MAGNESIUM SULFATE BOLUS VIA INFUSION
4.0000 g | Freq: Once | INTRAVENOUS | Status: AC
  Administered 2019-12-25: 4 g via INTRAVENOUS
  Filled 2019-12-25: qty 1000

## 2019-12-25 MED ORDER — ENOXAPARIN SODIUM 40 MG/0.4ML ~~LOC~~ SOLN
40.0000 mg | SUBCUTANEOUS | Status: DC
  Administered 2019-12-26: 40 mg via SUBCUTANEOUS
  Filled 2019-12-25 (×3): qty 0.4

## 2019-12-25 MED ORDER — WITCH HAZEL-GLYCERIN EX PADS: 1.0000 "application " | MEDICATED_PAD | CUTANEOUS | Status: DC | PRN

## 2019-12-25 MED ORDER — DIPHENHYDRAMINE HCL 25 MG PO CAPS: 25.0000 mg | ORAL_CAPSULE | Freq: Four times a day (QID) | ORAL | Status: DC | PRN

## 2019-12-25 MED ORDER — SENNOSIDES-DOCUSATE SODIUM 8.6-50 MG PO TABS
2.0000 | ORAL_TABLET | ORAL | Status: DC
  Filled 2019-12-25 (×3): qty 2

## 2019-12-25 NOTE — H&P (Signed)
Postpartum ADMISSION HISTORY AND PHYSICAL  Alexandria Boyle is a 44 y.o. female [id number] postpartum from a repeat c/s on 5/14 who presents to MAU for elevated BPs and headache. She was seen in MAU last night and discharged home on enalapril. She called the hospital reporting BPs in the 170s/110s. She also reports a frontal headache that she rates a 5/10. She denies visual changes or epigastric pain.   Prenatal History/Complications: gestational HTN, AMA, limited prenatal care  Past Medical History: Past Medical History:  Diagnosis Date  . Allergy   . Arthritis   . Back disorder   . Complex regional pain syndrome   . CRPS (complex regional pain syndrome type I)   . CRPS (complex regional pain syndrome type I)   . Depression   . Fibromyalgia   . Paresthesia 07/13/2015  . Syncope and collapse 07/13/2015    Past Surgical History: Past Surgical History:  Procedure Laterality Date  . CESAREAN SECTION    . CESAREAN SECTION WITH BILATERAL TUBAL LIGATION N/A 12/17/2019   Procedure: CESAREAN SECTION WITH BILATERAL TUBAL LIGATION;  Surgeon: Chancy Milroy, MD;  Location: [facility];  Service: Obstetrics;  Laterality: N/A;    Obstetrical History: OB History    Gravida  3   Para  2   Term  2   Preterm  0   AB  1   Living  2     SAB  1   TAB  0   Ectopic  0   Multiple  0   Live Births  2           Social History Social History   Socioeconomic History  . Marital status: Single    Spouse name: Not on file  . Number of children: 1  . Years of education: 19  . Highest education level: Not on file  Occupational History  . Occupation: Geophysicist/field seismologist  Tobacco Use  . Smoking status: Never Smoker  . Smokeless tobacco: Never Used  Substance and Sexual Activity  . Alcohol use: No  . Drug use: No  . Sexual activity: Yes    Birth control/protection: None  Other Topics Concern  . Not on file  Social History Narrative   Patient drinks about 1-2 cups of caffeine daily.    Patient is right handed.    Social Determinants of Health   Financial Resource Strain:   . Difficulty of Paying Living Expenses:   Food Insecurity:   . Worried About Charity fundraiser in the Last Year:   . Arboriculturist in the Last Year:   Transportation Needs:   . Film/video editor (Medical):   Marland Kitchen Lack of Transportation (Non-Medical):   Physical Activity:   . Days of Exercise per Week:   . Minutes of Exercise per Session:   Stress:   . Feeling of Stress :   Social Connections:   . Frequency of Communication with Friends and Family:   . Frequency of Social Gatherings with Friends and Family:   . Attends Religious Services:   . Active Member of Clubs or Organizations:   . Attends Archivist Meetings:   Marland Kitchen Marital Status:     Family History: Family History  Problem Relation Age of Onset  . Diabetes Mother   . Cancer Father   . Cancer Maternal Grandfather   . Diabetes Maternal Grandfather     Allergies: Allergies  Allergen Reactions  . Bee Pollen Swelling  . Savella [Milnacipran]   .  Tomato Itching and Swelling  . Citrus Rash  . Lyrica [Pregabalin] Rash  . Neurontin [Gabapentin] Swelling and Rash  . Strawberry Extract Itching and Rash    Medications Prior to Admission  Medication Sig Dispense Refill Last Dose  . acetaminophen (TYLENOL) 160 MG/5ML solution Take 20.3 mLs (650 mg total) by mouth every 6 (six) hours as needed for mild pain (temperature > 101.5.). 120 mL 0   . cetirizine HCl (ZYRTEC) 5 MG/5ML SOLN Take 5 mLs (5 mg total) by mouth daily. 300 mL 0   . enalapril (VASOTEC) 10 MG tablet Take 1 tablet (10 mg total) by mouth daily. 30 tablet 2   . ibuprofen (ADVIL) 100 MG/5ML suspension Take 40 mLs (800 mg total) by mouth every 6 (six) hours. 150 mL 0   . oxyCODONE (OXY IR/ROXICODONE) 5 MG immediate release tablet Take 1 tablet (5 mg total) by mouth every 4 (four) hours as needed for up to 5 days for moderate pain. 30 tablet 0   .  senna-docusate (SENOKOT-S) 8.6-50 MG tablet Take 2 tablets by mouth daily. 30 tablet 0   . simethicone (MYLICON) 80 MG chewable tablet Chew 1 tablet (80 mg total) by mouth 3 (three) times daily after meals. 30 tablet 0      Review of Systems   All systems reviewed and negative except as stated in HPI  Blood pressure (!) 151/91, pulse 87, temperature 98.4 F (36.9 C), temperature source Oral, resp. rate 17, SpO2 99 %, currently breastfeeding. General appearance: alert, cooperative and no distress Lungs: clear to auscultation bilaterally Heart: regular rate and rhythm Abdomen: soft, non-tender; bowel sounds normal Pelvic: n/a Extremities: Homans sign is negative, no sign of DVT DTR's +2  Prenatal labs: ABO, Rh: --/--/A POS, A POS Performed at [facility] [address] [place], [place] [address]  [id number]) Antibody: NEG (05/12 1825) Rubella: 4.76 (02/17 1605) RPR: NON REACTIVE (05/12 1944)  HBsAg: Negative (02/17 1605)  HIV: Non Reactive (02/17 1605)  GBS: NEGATIVE/-- (05/12 2230)    Patient Active Problem List   Diagnosis Date Noted  . Gestational hypertension, third trimester 12/15/2019  . Obesity affecting pregnancy 10/14/2019  . History of cesarean section complicating pregnancy [id number]  . Fetal renal anomaly, single gestation 10/07/2019  . Supervision of other normal pregnancy, antepartum 09/22/2019  . AMA, Age>40 09/22/2019  . Pelvic pain affecting pregnancy 09/03/2019  . No prenatal care in current pregnancy in second trimester 09/03/2019  . Anxiety 11/25/2018  . Chronic midline low back pain with bilateral sciatica 08/26/2018  . Uterine fibroid 04/24/2017  . Numbness 02/19/2017  . Chronic pain syndrome 02/19/2017  . Chronic back pain 09/19/2015  . Anemia, iron deficiency 09/19/2015  . Fibromyalgia 09/19/2015  . Syncope and collapse 07/13/2015    Assessment/Plan:  Labs from yesterday and presentation reviewed with Dr. Elly Modena. Recommends  admission for postpartum preeclampsia and magnesium. Patient agreeable to plan of care.   Postpartum Preeclampsia  Admit to [facility] turned over to Dr. Elly Modena.   Wende Mott, CNM  12/25/2019, 5:26 PM

## 2019-12-25 NOTE — Telephone Encounter (Addendum)
Patient called unit and requested call back about her blood pressure. Chart reviewed; patient was seen last night for elevated BPs, sent home on enalapril with strict precautions. Patient reports that she picked up Enalapril 10 mg this morning and took her first dose at 9:30.   At 1415 her her BP is 163/106  pulse is 84. She reports that her BP was 177  over  "can't remember".  She reports that she has diarrhea, she reports a slight headache. She wants to know what she should do. I advised patient to come in to MAU for evaluation and that she may need admission for post-partum pre-eclampsia. Patient is unable to get to MAU now since FOB is not home . I recommended eat and rest and take pain medicine and then come to MAU.   1430: MAU provider Maryruth Hancock called patient back and reiterated patient needs to come to MAU for evaluation; patient agrees to come in as soon possible.   Maye Hides

## 2019-12-25 NOTE — MAU Note (Signed)
Alexandria Boyle is a 44 y.o. PP c-section 12/17/19 here in MAU reporting:  +swelling in left foot +elevated blood pressures at home +headache. Not currently but earlier today.  Denies vision changes or epigastric pain. Pain score: denies. Feels like in her lower mid back there is a "knot" ever since her surgery.   Vitals:   12/25/19 1711  BP: (!) 144/101  Pulse: 92  Resp: 17  Temp: 98.4 F (36.9 C)  SpO2: 99%    Lab orders placed from triage: none

## 2019-12-26 LAB — COMPREHENSIVE METABOLIC PANEL
ALT: 36 U/L (ref 0–44)
AST: 26 U/L (ref 15–41)
Albumin: 2.8 g/dL — ABNORMAL LOW (ref 3.5–5.0)
Alkaline Phosphatase: 100 U/L (ref 38–126)
Anion gap: 6 (ref 5–15)
BUN: 6 mg/dL (ref 6–20)
CO2: 25 mmol/L (ref 22–32)
Calcium: 7.1 mg/dL — ABNORMAL LOW (ref 8.9–10.3)
Chloride: 108 mmol/L (ref 98–111)
Creatinine, Ser: 0.67 mg/dL (ref 0.44–1.00)
GFR calc Af Amer: >60 mL/min (ref >60)
GFR calc non Af Amer: >60 mL/min (ref >60)
Glucose, Bld: 114 mg/dL — ABNORMAL HIGH (ref 70–99)
Potassium: 3.6 mmol/L (ref 3.5–5.1)
Sodium: 139 mmol/L (ref 135–145)
Total Bilirubin: 0.3 mg/dL (ref 0.3–1.2)
Total Protein: 6.4 g/dL — ABNORMAL LOW (ref 6.5–8.1)

## 2019-12-26 MED ORDER — IBUPROFEN 100 MG/5ML PO SUSP
200.0000 mg | Freq: Once | ORAL | Status: AC
  Administered 2019-12-26: 200 mg via ORAL
  Filled 2019-12-26: qty 10

## 2019-12-26 MED ORDER — FUROSEMIDE 10 MG/ML IJ SOLN
20.0000 mg | Freq: Once | INTRAMUSCULAR | Status: AC
  Administered 2019-12-26: 20 mg via INTRAVENOUS
  Filled 2019-12-26: qty 2

## 2019-12-26 MED ORDER — IBUPROFEN 100 MG/5ML PO SUSP
200.0000 mg | Freq: Four times a day (QID) | ORAL | Status: DC
  Administered 2019-12-26: 200 mg via ORAL
  Filled 2019-12-26 (×2): qty 10

## 2019-12-26 MED ORDER — FLINTSTONES GUMMIES COMPLETE PO CHEW
2.0000 | CHEWABLE_TABLET | Freq: Every day | ORAL | Status: DC
  Filled 2019-12-26 (×2): qty 2

## 2019-12-26 MED ORDER — ZOLPIDEM TARTRATE 5 MG PO TABS: 5.0000 mg | ORAL_TABLET | Freq: Every evening | ORAL | Status: DC | PRN

## 2019-12-26 MED ORDER — OXYCODONE-ACETAMINOPHEN 5-325 MG PO TABS
1.0000 | ORAL_TABLET | Freq: Four times a day (QID) | ORAL | Status: DC | PRN
  Filled 2019-12-26: qty 1

## 2019-12-26 MED ORDER — IBUPROFEN 100 MG/5ML PO SUSP
600.0000 mg | Freq: Four times a day (QID) | ORAL | Status: DC
  Administered 2019-12-27: 600 mg via ORAL
  Filled 2019-12-26: qty 30

## 2019-12-26 MED ORDER — HYDROXYZINE HCL 50 MG PO TABS: 25.0000 mg | ORAL_TABLET | Freq: Three times a day (TID) | ORAL | Status: DC | PRN

## 2019-12-26 MED ORDER — ANIMAL SHAPES WITH C & FA PO CHEW
1.0000 | CHEWABLE_TABLET | Freq: Every day | ORAL | Status: DC
  Filled 2019-12-26: qty 1

## 2019-12-26 NOTE — Progress Notes (Signed)
Pt upset that her vitamins were kept by pharmacy and dispensed individually. She states that her daughter, who is her support person, also takes the OTC multivitamin and does not understand why it was kept by pharmacy.  Pt informed that this was done per protocol so that she could take her home vitamins, which she insisted on taking after being offered different hospital-provided formulations. Pt now upset that "other people were touching my medicine" and is now requesting to speak with a pharmacist. Pharmacist Gerald Stabs spoke with patient and addressed her concerns. Ferguson with MD and pharmacy for patient to keep her Flinstone's gummy vitamins at bedside for pt use. Pt's vitamin bottle returned to patient.

## 2019-12-26 NOTE — Progress Notes (Signed)
CSW acknowledged consult,  however, MOB is currently on magnesium until 6:00 pm today.  CSW will meet with MOB at a later time.  Vuong Musa D. Lissa Morales, MSW, [facility] Clinical Social Worker [phone]

## 2019-12-26 NOTE — Progress Notes (Signed)
The patient discussed feelings of sadness and fear with this RN related to her readmission to the hospital and her new diagnosis of Pre-eclampsia. The patient became tearful and repeatedly stated, "If I die my daughter wouldn't be able to take care of the baby." The patient repeated the phrase "if I die" multiple times during the conversation.   The patient additionally discussed feeling frustration and fear because she was seen on 5/21 in MAU but was discharged and continued to feel unwell until she came back to the hospital on 5/22. She said, "They can't let me leave until they fix me because I can't leave my kids behind. Don't let them discharge me again until I'm fixed."   This RN reasured the patient that the healthcare team was working to keep her safe and address her concerns. This RN discussed the current plan of care, the use of magnesium sulfate, the use of PO blood pressure medications, signs and symptoms of pre-eclampsia, and other measures being taken by the healthcare team.   The patient states understanding that the current plan of care is helping her in the hospital but expresses concerns about being discharged home and problems related to her blood pressure coming back. She stated, "It makes me so scared looking back and thinking about holding him [her newborn] and feeling as bad I did. I knew I wasn't well but they discharged me anyway. I keep thinking about all the bad things that could've happened because I went home. I would have felt terrible if I hurt him. My daughter would feel terrible if anything happened to him. If something happened to me and she had to take care of him, and then something happened to him, she wouldn't be okay. She can't handle him. She couldn't do it- look after him. She tries to help, but she couldn't do it." The patient then proceeded to talk about how she feels guilt and fear for her daughter who is struggling with college classes. The patient stated that she  feels like it is her fault that her daughter is struggling since her daughter is helping her and the baby while she is in the hospital when the daughter has homework and tests that she needs to be taking.  This RN tried discussed the patients concerns and feelings but the patient became focused on her infant needing to eat. This RN put in a consult for Social Work to follow up. Will continue to encourage patient to express concerns, discuss patient fears, and reassure as needed.

## 2019-12-26 NOTE — Progress Notes (Signed)
Pt's Flinstone's Gummy Vitamins hand delivered to pharmacy for verification.

## 2019-12-26 NOTE — Progress Notes (Signed)
Pt refusing to take hospital supplied children's chewable vitamin and is requesting to take her own "Flinstone's vitamin." MD aware and ok with this. Pharmacy notified.

## 2019-12-26 NOTE — Progress Notes (Signed)
Subjective: Postpartum Day 9: Cesarean Delivery with preeclampsia Patient reports patient reports feeling well. She denies any HA or visual changes..    Objective: Vital signs in last 24 hours: Temp:  [98.4 F (36.9 C)-98.8 F (37.1 C)] 98.8 F (37.1 C) (05/22 1820) Pulse Rate:  [77-92] 83 (05/23 0610) Resp:  [17-20] 17 (05/23 0610) BP: (133-160)/(79-107) 146/85 (05/23 0610) SpO2:  [97 %-100 %] 100 % (05/23 0610)  Physical Exam:  General: alert, cooperative and no distress Lochia: appropriate Uterine Fundus: firm Incision: healing well, no significant drainage, no significant erythema DVT Evaluation: No evidence of DVT seen on physical exam.  Recent Labs    12/24/19 1709 12/25/19 1832  HGB 11.2* 11.0*  HCT 35.3* 35.5*    Assessment/Plan: Status post Cesarean section. Postoperative course complicated by severe preeclampsia  Continue magnesium sulfate for a total of 24 hours Continue enalapril Continue current care.  Lander Eslick 12/26/2019, 7:46 AM

## 2019-12-27 ENCOUNTER — Ambulatory Visit: Payer: Medicare Other

## 2019-12-27 DIAGNOSIS — Z8759 Personal history of other complications of pregnancy, childbirth and the puerperium: Secondary | ICD-10-CM

## 2019-12-27 DIAGNOSIS — O1495 Unspecified pre-eclampsia, complicating the puerperium: Secondary | ICD-10-CM

## 2019-12-27 DIAGNOSIS — O135 Gestational [pregnancy-induced] hypertension without significant proteinuria, complicating the puerperium: Secondary | ICD-10-CM

## 2019-12-27 DIAGNOSIS — O99215 Obesity complicating the puerperium: Secondary | ICD-10-CM

## 2019-12-27 DIAGNOSIS — O34219 Maternal care for unspecified type scar from previous cesarean delivery: Secondary | ICD-10-CM

## 2019-12-27 MED ORDER — ENALAPRIL MALEATE 10 MG PO TABS
10.0000 mg | ORAL_TABLET | Freq: Every day | ORAL | 2 refills | Status: DC
Start: 2019-12-27 — End: 2019-12-27

## 2019-12-27 MED ORDER — NIFEDIPINE ER OSMOTIC RELEASE 30 MG PO TB24: 30.0000 mg | ORAL_TABLET | Freq: Every day | ORAL | Status: DC

## 2019-12-27 MED ORDER — OXYCODONE HCL 5 MG PO TABS
5.0000 mg | ORAL_TABLET | ORAL | Status: DC | PRN
  Administered 2019-12-27: 5 mg via ORAL
  Filled 2019-12-27: qty 1

## 2019-12-27 NOTE — Progress Notes (Signed)
Discharge instructions and prescriptions given to pt. Discussed signs and symptoms to report to the MD, upcoming appointments, and meds. Pt verbalizes understanding and has no questions or concerns at this time. Pt discharged in stable condition.

## 2019-12-27 NOTE — Discharge Instructions (Signed)
Preeclampsia and Eclampsia Preeclampsia is a serious condition that may develop during pregnancy. This condition causes high blood pressure and increased protein in your urine along with other symptoms, such as headaches and vision changes. These symptoms may develop as the condition gets worse. Preeclampsia may occur at 20 weeks of pregnancy or later. Diagnosing and treating preeclampsia early is very important. If not treated early, it can cause serious problems for you and your baby. One problem it can lead to is eclampsia. Eclampsia is a condition that causes muscle jerking or shaking (convulsions or seizures) and other serious problems for the mother. During pregnancy, delivering your baby may be the best treatment for preeclampsia or eclampsia. For most women, preeclampsia and eclampsia symptoms go away after giving birth. In rare cases, a woman may develop preeclampsia after giving birth (postpartum preeclampsia). This usually occurs within 48 hours after childbirth but may occur up to 6 weeks after giving birth. What are the causes? The cause of preeclampsia is not known. What increases the risk? The following risk factors make you more likely to develop preeclampsia:  Being pregnant for the first time.  Having had preeclampsia during a past pregnancy.  Having a family history of preeclampsia.  Having high blood pressure.  Being pregnant with more than one baby.  Being 35 or older.  Being African-American.  Having kidney disease or diabetes.  Having medical conditions such as lupus or blood diseases.  Being very overweight (obese). What are the signs or symptoms? The most common symptoms are:  Severe headaches.  Vision problems, such as blurred or double vision.  Abdominal pain, especially upper abdominal pain. Other symptoms that may develop as the condition gets worse include:  Sudden weight gain.  Sudden swelling of the hands, face, legs, and feet.  Severe nausea  and vomiting.  Numbness in the face, arms, legs, and feet.  Dizziness.  Urinating less than usual.  Slurred speech.  Convulsions or seizures. How is this diagnosed? There are no screening tests for preeclampsia. Your health care provider will ask you about symptoms and check for signs of preeclampsia during your prenatal visits. You may also have tests that include:  Checking your blood pressure.  Urine tests to check for protein. Your health care provider will check for this at every prenatal visit.  Blood tests.  Monitoring your baby's heart rate.  Ultrasound. How is this treated? You and your health care provider will determine the treatment approach that is best for you. Treatment may include:  Having more frequent prenatal exams to check for signs of preeclampsia, if you have an increased risk for preeclampsia.  Medicine to lower your blood pressure.  Staying in the hospital, if your condition is severe. There, treatment will focus on controlling your blood pressure and the amount of fluids in your body (fluid retention).  Taking medicine (magnesium sulfate) to prevent seizures. This may be given as an injection or through an IV.  Taking a low-dose aspirin during your pregnancy.  Delivering your baby early. You may have your labor started with medicine (induced), or you may have a cesarean delivery. Follow these instructions at home: Eating and drinking   Drink enough fluid to keep your urine pale yellow.  Avoid caffeine. Lifestyle  Do not use any products that contain nicotine or tobacco, such as cigarettes and e-cigarettes. If you need help quitting, ask your health care provider.  Do not use alcohol or drugs.  Avoid stress as much as possible. Rest and get   plenty of sleep. General instructions  Take over-the-counter and prescription medicines only as told by your health care provider.  When lying down, lie on your left side. This keeps pressure off your  major blood vessels.  When sitting or lying down, raise (elevate) your feet. Try putting some pillows underneath your lower legs.  Exercise regularly. Ask your health care provider what kinds of exercise are best for you.  Keep all follow-up and prenatal visits as told by your health care provider. This is important. How is this prevented? There is no known way of preventing preeclampsia or eclampsia from developing. However, to lower your risk of complications and detect problems early:  Get regular prenatal care. Your health care provider may be able to diagnose and treat the condition early.  Maintain a healthy weight. Ask your health care provider for help managing weight gain during pregnancy.  Work with your health care provider to manage any long-term (chronic) health conditions you have, such as diabetes or kidney problems.  You may have tests of your blood pressure and kidney function after giving birth.  Your health care provider may have you take low-dose aspirin during your next pregnancy. Contact a health care provider if:  You have symptoms that your health care provider told you may require more treatment or monitoring, such as: ? Headaches. ? Nausea or vomiting. ? Abdominal pain. ? Dizziness. ? Light-headedness. Get help right away if:  You have severe: ? Abdominal pain. ? Headaches that do not get better. ? Dizziness. ? Vision problems. ? Confusion. ? Nausea or vomiting.  You have any of the following: ? A seizure. ? Sudden, rapid weight gain. ? Sudden swelling in your hands, ankles, or face. ? Trouble moving any part of your body. ? Numbness in any part of your body. ? Trouble speaking. ? Abnormal bleeding.  You faint. Summary  Preeclampsia is a serious condition that may develop during pregnancy.  This condition causes high blood pressure and increased protein in your urine along with other symptoms, such as headaches and vision  changes.  Diagnosing and treating preeclampsia early is very important. If not treated early, it can cause serious problems for you and your baby.  Get help right away if you have symptoms that your health care provider told you to watch for. This information is not intended to replace advice given to you by your health care provider. Make sure you discuss any questions you have with your health care provider. Document Revised: 03/24/2018 Document Reviewed: 02/26/2016 Elsevier Patient Education  2020 Elsevier Inc.  

## 2019-12-27 NOTE — Progress Notes (Signed)
CSW received consult due to score 10 on Edinburgh Depression Screen.  When CSW arrived, MOB was bonding with infant as evidence by engaging in skin to skin, and MOB's daughter was observing MOB's and infant's interaction.  CSW explained CSW's role and MOB gave CSW permission to complete assessment while MOB's older daughter was present.   CSW reviewed MOB's edinburgh results and asked about MOB's thoughts and feelings about her readmission.  MOB shared initially feeling scared and communicated to CSW, "I just didn't know what was going on with me.  I have never had any blood pressure problems and all the hospital back and forth had me overwhelmed." CSW validated MOB's thoughts and feelings and shared other emotions that MOB may experience.  MOB denied having any MH hx and she denied PMAD symptoms with her oldest daughter 21 years ago.   CSW provided education regarding Baby Blues vs PMADs and provided MOB with resources for mental health follow up.  CSW encouraged MOB to evaluate her mental health throughout the postpartum period with the use of the New Mom Checklist developed by [employer]partum [employer] as well as the [place] Postnatal Depression Scale and notify a medical professional if symptoms arise.  MOB presented with insight and awareness and did not demonstrate any acute MH symptoms.  MOB denied SI and HI and reported having a good support team that will be available.   Per MOB, MOB has all essential items to care for infant.   Laurey Arrow, MSW, LCSW Clinical Social Work [phone]

## 2019-12-27 NOTE — Discharge Summary (Signed)
Postpartum Discharge Summary   Patient Name: Alexandria Boyle DOB: [birth year] MRN: [id number]  Date of admission: 5/22/2021Date of discharge: 12/27/2019  Admitting diagnosis: Preeclampsia in postpartum period [O14.95]    Secondary diagnosis:  Principal Problem:   Preeclampsia in postpartum period Active Problems:   Supervision of other normal pregnancy, antepartum   AMA, Age>40   Fetal renal anomaly, single gestation   Obesity affecting pregnancy   History of cesarean section complicating pregnancy   Gestational hypertension, third trimester  Hospital course: Readmitted 12/25/19 for post partum pre-eclampsia. Received 24 hrs of magnesium and continued on vasotec. Her BP remained stable after magnesium and she was doing well. Complains today of mild dizziness but otherwise feeling well. Pain well controlled. Eating and drinking normally.   Magnesium Sulfate received: Yes: Seizure prophylaxis   Physical exam  Vitals:   12/26/19 2110 12/27/19 0020 12/27/19 0418 12/27/19 0806  BP: (!) 157/77 (!) 152/97 132/86 118/77  Pulse: 75 90 99 79  Resp: 20 19 18 18   Temp:   98.9 F (37.2 C) 99 F (37.2 C)  TempSrc:   Oral Oral  SpO2: 99% 99% 100% 100%   General: alert, cooperative and no distress Lochia: appropriate Uterine Fundus: firm Incision: Healing well with no significant drainage DVT Evaluation: No evidence of DVT seen on physical exam. Labs: Lab Results  Component Value Date   WBC 8.8 12/25/2019   HGB 11.0 (L) 12/25/2019   HCT 35.5 (L) 12/25/2019   MCV 86.4 12/25/2019   PLT 316 12/25/2019   CMP Latest Ref Rng & Units 12/26/2019  Glucose 70 - 99 mg/dL 114(H)  BUN 6 - 20 mg/dL 6  Creatinine 0.44 - 1.00 mg/dL 0.67  Sodium 135 - 145 mmol/L 139  Potassium 3.5 - 5.1 mmol/L 3.6  Chloride 98 - 111 mmol/L 108  CO2 22 - 32 mmol/L 25  Calcium 8.9 - 10.3 mg/dL 7.1(L)  Total Protein 6.5 - 8.1 g/dL 6.4(L)  Total Bilirubin 0.3 - 1.2 mg/dL 0.3  Alkaline Phos 38 - 126 U/L 100   AST 15 - 41 U/L 26  ALT 0 - 44 U/L 36   Edinburgh Score: Edinburgh Postnatal Depression Scale Screening Tool 12/18/2019  I have been able to laugh and see the funny side of things. 1  I have looked forward with enjoyment to things. 0  I have blamed myself unnecessarily when things went wrong. 2  I have been anxious or worried for no good reason. 2  I have felt scared or panicky for no good reason. 1  Things have been getting on top of me. 1  I have been so unhappy that I have had difficulty sleeping. 1  I have felt sad or miserable. 1  I have been so unhappy that I have been crying. 1  The thought of harming myself has occurred to me. 0  Edinburgh Postnatal Depression Scale Total 10     After visit meds:  Allergies as of 12/27/2019      Reactions   Bee Pollen Swelling   Savella [milnacipran]    Tomato Itching, Swelling   Citrus Rash   Lyrica [pregabalin] Rash   Neurontin [gabapentin] Swelling, Rash   Strawberry Extract Itching, Rash      Medication List    STOP taking these medications   ibuprofen 100 MG/5ML suspension Commonly known as: ADVIL   oxyCODONE 5 MG immediate release tablet Commonly known as: Oxy IR/ROXICODONE     TAKE these medications  acetaminophen 160 MG/5ML solution Commonly known as: TYLENOL Take 20.3 mLs (650 mg total) by mouth every 6 (six) hours as needed for mild pain (temperature > 101.5.).   cetirizine HCl 5 MG/5ML Soln Commonly known as: Zyrtec Take 5 mLs (5 mg total) by mouth daily.   enalapril 10 MG tablet Commonly known as: Vasotec Take 1 tablet (10 mg total) by mouth daily.   senna-docusate 8.6-50 MG tablet Commonly known as: Senokot-S Take 2 tablets by mouth daily.   simethicone 80 MG chewable tablet Commonly known as: MYLICON Chew 1 tablet (80 mg total) by mouth 3 (three) times daily after meals.       Future Appointments: Future Appointments  Date Time Provider [place]  12/27/2019  2:00 PM Marcus None  12/29/2019  3:40 PM [place] None  01/21/2020 11:10 AM Darlina Rumpf, CNM [facility] None   Follow up Visit: Follow-up [place]. Call in 2 day(s).   Specialty: Obstetrics and Gynecology Contact information: [address] [place] [address] [place] [phone]          12/27/2019 Sloan Leiter, MD

## 2019-12-28 ENCOUNTER — Ambulatory Visit: Payer: Medicare Other

## 2019-12-28 NOTE — Anesthesia Postprocedure Evaluation (Signed)
Anesthesia Post Note  Patient: Alexandria Boyle  Procedure(s) Performed: CESAREAN SECTION WITH BILATERAL TUBAL LIGATION (N/A )     Patient location during evaluation: PACU Anesthesia Type: Epidural Level of consciousness: awake Pain management: pain level controlled Vital Signs Assessment: post-procedure vital signs reviewed and stable Respiratory status: spontaneous breathing Cardiovascular status: stable Postop Assessment: no headache, no backache, epidural receding, patient able to bend at knees and no apparent nausea or vomiting Anesthetic complications: no    Last Vitals:  Vitals:   12/27/19 0418 12/27/19 0806  BP: 132/86 118/77  Pulse: 99 79  Resp: 18 18  Temp: 37.2 C 37.2 C  SpO2: 100% 100%    Last Pain:  Vitals:   12/27/19 1000  TempSrc:   PainSc: 3    Pain Goal: Patients Stated Pain Goal: 3 (12/27/19 1000)                 Huston Foley

## 2019-12-29 ENCOUNTER — Ambulatory Visit: Payer: Medicare Other

## 2020-01-04 ENCOUNTER — Telehealth: Payer: Self-pay

## 2020-01-04 ENCOUNTER — Other Ambulatory Visit: Payer: Self-pay | Admitting: Obstetrics and Gynecology

## 2020-01-04 MED ORDER — HYDROCODONE-ACETAMINOPHEN 5-325 MG PO TABS: 1.0000 | ORAL_TABLET | Freq: Four times a day (QID) | ORAL | 0 refills | Status: DC | PRN

## 2020-01-04 MED ORDER — IBUPROFEN 100 MG/5ML PO SUSP: 400.0000 mg | ORAL | 1 refills | Status: AC | PRN

## 2020-01-04 NOTE — Telephone Encounter (Signed)
We received a telephone call from [name] asking about a prescription that was to be called in for her.  [name] stated that she spoke with one our doctors on 01/03/2020.  Ms Alexandria Boyle stated that the providers was going to be sending in a prescription for her.  I placed [name] on a brief hold in order to research her request.  After further review I did see a message from the after hours nurse team that someone did speak with her.  I then advised [name] that I did not see any documentation that she was going to be sent a prescription. [name] told me that she spoke with a provider and that they were going to send a prescription in for some Oxycodone.  [name] proceeded to tell me all the issues that she was having.  She states that she having pain at her incision site, a stabbing pain in her ear, elevated blood pressure, and that her face is swollen.  I advised [name] that she needs to be seen in the office.  [name] again proceeded to tell me all the issues that she's having.  I asked [name] for her best phone number so that we could follow-up with the provider and give her a call back.

## 2020-01-05 ENCOUNTER — Other Ambulatory Visit: Payer: Self-pay

## 2020-01-05 ENCOUNTER — Ambulatory Visit (HOSPITAL_BASED_OUTPATIENT_CLINIC_OR_DEPARTMENT_OTHER): Payer: Medicare Other | Admitting: Obstetrics and Gynecology

## 2020-01-05 ENCOUNTER — Encounter: Payer: Self-pay | Admitting: Obstetrics and Gynecology

## 2020-01-05 VITALS — BP 144/97 | HR 93 | Wt 177.4 lb

## 2020-01-05 DIAGNOSIS — O1495 Unspecified pre-eclampsia, complicating the puerperium: Secondary | ICD-10-CM

## 2020-01-05 DIAGNOSIS — Z9889 Other specified postprocedural states: Secondary | ICD-10-CM

## 2020-01-05 NOTE — Progress Notes (Signed)
Pt is here to be evaluated for pain she reports she is experiencing after C section on 12/17/19. Pt reports "the whole right side of my body is hurting". Pt had phone visit with Dr[facility] Glo [facility] yesterday where he prescribed pt Norco/Vicodin 5-325 mg tablets, pt reports this is helping with the pain.
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
## 2020-01-13 ENCOUNTER — Telehealth: Payer: Medicare Other | Admitting: Obstetrics & Gynecology

## 2020-01-21 ENCOUNTER — Other Ambulatory Visit: Payer: Self-pay

## 2020-01-21 ENCOUNTER — Encounter: Payer: Self-pay | Admitting: Advanced Practice Midwife

## 2020-01-21 ENCOUNTER — Ambulatory Visit (INDEPENDENT_AMBULATORY_CARE_PROVIDER_SITE_OTHER): Payer: Medicare Other | Admitting: Advanced Practice Midwife

## 2020-01-21 VITALS — BP 124/83 | HR 97 | Wt 173.4 lb

## 2020-01-21 DIAGNOSIS — E638 Other specified nutritional deficiencies: Secondary | ICD-10-CM

## 2020-01-21 NOTE — Progress Notes (Signed)
..     [place] Partum Visit Note  Alexandria Boyle is a 44 y.o. G56P2012 female who presents for a postpartum visit. She is 5 weeks postpartum following a repeat cesarean section.  I have fully reviewed the prenatal and intrapartum course. The delivery was at 37.3 gestational weeks.  Anesthesia: epidural. Postpartum course has been complicated by readmission for Preeclampsia. Baby is doing well. Baby is feeding by bottle - Enfamil. Bleeding staining only. Bowel function is normal. Bladder function is normal. Patient is not sexually active. Contraception method is tubal ligation. Postpartum depression screening: negative.  Patient has two blood pressure cuffs at home and they are with her today during her visit. She endorses elevated pressures on these cuffs which do not match the reassuring numbers obtained on clinic devices.   Patient states she was previously advised to avoid seasoning and fast food. She feels as if she is not eating anything, feeling weak and hungry then indulging in foods she knows are wrong e.g. potato chips. She is drinking 2-3 large bottles of smart water each day.   The following portions of the patient's history were reviewed and updated as appropriate: allergies, current medications, past family history, past medical history, past social history, past surgical history and problem list.  Review of Systems A comprehensive review of systems was negative.    Objective:  currently breastfeeding.  General:  alert, cooperative, appears stated age, no distress and mildly obese   Breasts:  negative  Lungs: clear to auscultation bilaterally  Heart:  regular rate and rhythm, S1, S2 normal, no murmur, click, rub or gallop  Abdomen: soft, non-tender; bowel sounds normal; no masses,  no organomegaly   Vulva:  not evaluated  Vagina: not evaluated   Incision healing appropriately. Well-approximated. No bruising, drainage or streaking       Assessment:   --Normal postpartum exam.   --Pap smear not done at today's visit.  --BP 124/83 on medication. Continue unless otherwise advised by PCP  Plan:   Essential components of care per ACOG recommendations:  1.  Mood and well being: Patient with negative depression screening today. Reviewed local resources for support.  - Patient does not use tobacco. - hx of drug use? No    2. Infant care and feeding:  -Patient currently breastmilk feeding? No   3. Sexuality, contraception and birth spacing - S/p BTL  4. Sleep and fatigue -Encouraged family/partner/community support of 4 hrs of uninterrupted sleep to help with mood and fatigue  5. Physical Recovery  - Discussed patients delivery and complications - With permission, used patient's home BP cuffs on myself. First cuff gave two errors messages in sequence. Second cuff gave elevated reading not c/w clinic cuff. Encouraged patient to use calibrated clinic or pharmacy machines if she is worried about elevated readings  6.  Health Maintenance - Last pap smear done in first trimester and was normal with negative HPV.  7. Chronic Disease:     a. Hypertension, on medication. PCP follow up at patient's earliest convenience    b. Nerve pain. S/p consult with Dr. Glo Herring, advised PCP follow up. Declined referral to PT    c  Diet. Reviewed recommendations for hydration, food selection, caloric intake. Referral to [place], MSN, CNM Certified Nurse Midwife, [facility] for [employer], [facility] 01/21/20 12:14 PM

## 2020-01-21 NOTE — Patient Instructions (Signed)

## 2020-02-25 ENCOUNTER — Emergency Department (HOSPITAL_COMMUNITY)
Admission: EM | Admit: 2020-02-25 | Discharge: 2020-02-25 | Disposition: A | Discharge status: Home / Self Care | Payer: Medicare Other | Attending: Emergency Medicine | Admitting: Emergency Medicine

## 2020-02-25 DIAGNOSIS — I10 Essential (primary) hypertension: Secondary | ICD-10-CM | POA: Insufficient documentation

## 2020-02-25 DIAGNOSIS — R519 Headache, unspecified: Secondary | ICD-10-CM | POA: Diagnosis not present

## 2020-02-25 DIAGNOSIS — Z5321 Procedure and treatment not carried out due to patient leaving prior to being seen by health care provider: Secondary | ICD-10-CM | POA: Insufficient documentation

## 2020-02-25 NOTE — ED Triage Notes (Signed)
Pt reports checking her blood pressure several time over the past 24 hours.  States her blood pressure at home is over 174...Marland KitchenMarland Kitchen  Pt states she has a headache from the high blood pressure.  In triage her blood pressure was much lower, which she attributes to her taking her medication.  Pt decided to go home. RN encouraged pt to follow up with her primary provider.  Last blood pressure was 134/94

## 2020-08-31 ENCOUNTER — Other Ambulatory Visit: Payer: Medicare Other

## 2020-08-31 ENCOUNTER — Other Ambulatory Visit: Payer: Self-pay

## 2020-08-31 DIAGNOSIS — Z20822 Contact with and (suspected) exposure to covid-19: Secondary | ICD-10-CM

## 2020-09-02 LAB — NOVEL CORONAVIRUS, NAA: SARS-CoV-2, NAA: DETECTED — AB

## 2020-09-02 LAB — SARS-COV-2, NAA 2 DAY TAT

## 2021-04-28 ENCOUNTER — Emergency Department (HOSPITAL_COMMUNITY)
Admission: EM | Admit: 2021-04-28 | Discharge: 2021-04-28 | Disposition: A | Discharge status: Home / Self Care | Payer: Medicare Other | Attending: Emergency Medicine | Admitting: Emergency Medicine

## 2021-04-28 ENCOUNTER — Other Ambulatory Visit: Payer: Self-pay

## 2021-04-28 ENCOUNTER — Encounter (HOSPITAL_COMMUNITY): Payer: Self-pay | Admitting: Emergency Medicine

## 2021-04-28 ENCOUNTER — Emergency Department (HOSPITAL_COMMUNITY): Payer: Medicare Other

## 2021-04-28 DIAGNOSIS — X58XXXA Exposure to other specified factors, initial encounter: Secondary | ICD-10-CM | POA: Insufficient documentation

## 2021-04-28 DIAGNOSIS — R0789 Other chest pain: Secondary | ICD-10-CM | POA: Diagnosis not present

## 2021-04-28 DIAGNOSIS — R110 Nausea: Secondary | ICD-10-CM | POA: Insufficient documentation

## 2021-04-28 DIAGNOSIS — S40021A Contusion of right upper arm, initial encounter: Secondary | ICD-10-CM | POA: Diagnosis not present

## 2021-04-28 DIAGNOSIS — Z5321 Procedure and treatment not carried out due to patient leaving prior to being seen by health care provider: Secondary | ICD-10-CM | POA: Insufficient documentation

## 2021-04-28 DIAGNOSIS — S4991XA Unspecified injury of right shoulder and upper arm, initial encounter: Secondary | ICD-10-CM | POA: Diagnosis present

## 2021-04-28 LAB — BASIC METABOLIC PANEL
Anion gap: 7 (ref 5–15)
BUN: 12 mg/dL (ref 6–20)
CO2: 25 mmol/L (ref 22–32)
Calcium: 8.6 mg/dL — ABNORMAL LOW (ref 8.9–10.3)
Chloride: 104 mmol/L (ref 98–111)
Creatinine, Ser: 0.67 mg/dL (ref 0.44–1.00)
GFR, Estimated: >60 mL/min (ref >60)
Glucose, Bld: 97 mg/dL (ref 70–99)
Potassium: 3.7 mmol/L (ref 3.5–5.1)
Sodium: 136 mmol/L (ref 135–145)

## 2021-04-28 LAB — PROTIME-INR
INR: 1.1 (ref 0.8–1.2)
Prothrombin Time: 14.2 seconds (ref 11.4–15.2)

## 2021-04-28 LAB — I-STAT BETA HCG BLOOD, ED (MC, WL, AP ONLY): I-stat hCG, quantitative: <5 m[IU]/mL (ref <5)

## 2021-04-28 LAB — CBC
HCT: 38.5 % (ref 36.0–46.0)
Hemoglobin: 12 g/dL (ref 12.0–15.0)
MCH: 27.4 pg (ref 26.0–34.0)
MCHC: 31.2 g/dL (ref 30.0–36.0)
MCV: 87.9 fL (ref 80.0–100.0)
Platelets: 245 10*3/uL (ref 150–400)
RBC: 4.38 MIL/uL (ref 3.87–5.11)
RDW: 14.6 % (ref 11.5–15.5)
WBC: 8.2 10*3/uL (ref 4.0–10.5)
nRBC: 0 % (ref 0.0–0.2)

## 2021-04-28 LAB — TROPONIN I (HIGH SENSITIVITY)
Troponin I (High Sensitivity): 4 ng/L (ref <18)
Troponin I (High Sensitivity): 3 ng/L (ref <18)

## 2021-04-28 NOTE — ED Notes (Signed)
Patient left ED per [facility]

## 2021-04-28 NOTE — ED Provider Notes (Signed)
Emergency Medicine Provider Triage Evaluation Note  Alexandria Boyle , a 45 y.o. female  was evaluated in triage.  Pt complains of central chest pressure which has been intermittent since yesterday. Feels like symptoms may be due to increased stress. Notes some associated nausea. No diaphoresis, fevers, syncope. Denies tobacco use or prior hx of ACS. No known FHx of cardiac disease.  Review of Systems  Positive: Chest pressure  Negative: As above  Physical Exam  BP 139/87 (BP Location: Left Arm)   Pulse 71   Temp 98.6 F (37 C)   Resp 17   SpO2 100%  Gen:   Awake, no distress   Resp:  Normal effort  MSK:   Moves extremities without difficulty  Other:  Lungs CTAB. Heart RRR.  Medical Decision Making  Medically screening exam initiated at 1:55 AM.  Appropriate orders placed.  Alexandria Boyle was informed that the remainder of the evaluation will be completed by another provider, this initial triage assessment does not replace that evaluation, and the importance of remaining in the ED until their evaluation is complete.  Chest pain   Antonietta Breach, PA-C 04/28/21 0157    Mesner, Corene Cornea, MD 04/29/21 [phone]

## 2021-04-28 NOTE — ED Triage Notes (Signed)
Pt reports chest pressure and nasuea starting two days ago.  Pt states she has been under a lot of stress but got concerned when she found a bump and bruise on her right arm.

## 2021-08-10 ENCOUNTER — Other Ambulatory Visit: Payer: Self-pay | Admitting: Family Medicine

## 2021-08-10 DIAGNOSIS — M545 Low back pain, unspecified: Secondary | ICD-10-CM

## 2021-08-13 ENCOUNTER — Other Ambulatory Visit: Payer: Self-pay | Admitting: Family Medicine

## 2021-08-13 DIAGNOSIS — G8929 Other chronic pain: Secondary | ICD-10-CM

## 2021-08-13 DIAGNOSIS — G90529 Complex regional pain syndrome I of unspecified lower limb: Secondary | ICD-10-CM

## 2021-08-13 DIAGNOSIS — M5442 Lumbago with sciatica, left side: Secondary | ICD-10-CM

## 2021-08-23 ENCOUNTER — Other Ambulatory Visit (HOSPITAL_COMMUNITY)
Admission: RE | Admit: 2021-08-23 | Discharge: 2021-08-23 | Disposition: A | Discharge status: Home / Self Care | Payer: Medicare Other | Source: Ambulatory Visit | Attending: Obstetrics and Gynecology | Admitting: Obstetrics and Gynecology

## 2021-08-23 ENCOUNTER — Encounter: Payer: Self-pay | Admitting: Obstetrics and Gynecology

## 2021-08-23 ENCOUNTER — Other Ambulatory Visit: Payer: Self-pay

## 2021-08-23 ENCOUNTER — Ambulatory Visit (INDEPENDENT_AMBULATORY_CARE_PROVIDER_SITE_OTHER): Payer: Medicaid Other | Admitting: Obstetrics and Gynecology

## 2021-08-23 VITALS — BP 108/75 | HR 94 | Ht 60.0 in | Wt 166.6 lb

## 2021-08-23 DIAGNOSIS — Z1151 Encounter for screening for human papillomavirus (HPV): Secondary | ICD-10-CM | POA: Insufficient documentation

## 2021-08-23 DIAGNOSIS — Z01419 Encounter for gynecological examination (general) (routine) without abnormal findings: Secondary | ICD-10-CM | POA: Insufficient documentation

## 2021-08-23 NOTE — Progress Notes (Signed)
Patient presents for AEX. Patient states that her c-section scar still has some soreness.  Last Pap: almost 2 years ago [facility]/ [facility]

## 2021-08-23 NOTE — Progress Notes (Signed)
Subjective:     Alexandria Boyle is a 46 y.o. female P2 with LMP 08/22/21 and BMI 32 who is here for a comprehensive physical exam. The patient reports no problems. Patient reports a monthly menses lasting 5-6 days. She is not currently sexually active and is using BTL for contraception. She denies pelvic pain or abnormal discharge. Patient denies any urinary incontinence  Past Medical History:  Diagnosis Date   Allergy    Arthritis    Back disorder    Complex regional pain syndrome    CRPS (complex regional pain syndrome type I)    CRPS (complex regional pain syndrome type I)    Depression    Fibromyalgia    Paresthesia 07/13/2015   Syncope and collapse 07/13/2015   Past Surgical History:  Procedure Laterality Date   CESAREAN SECTION     CESAREAN SECTION WITH BILATERAL TUBAL LIGATION N/A 12/17/2019   Procedure: CESAREAN SECTION WITH BILATERAL TUBAL LIGATION;  Surgeon: Chancy Milroy, MD;  Location: [place] [facility];  Service: Obstetrics;  Laterality: N/A;   Family History  Problem Relation Age of Onset   Diabetes Mother    Cancer Father    Cancer Maternal Grandfather    Diabetes Maternal Grandfather     Social History   Socioeconomic History   Marital status: Single    Spouse name: Not on file   Number of children: 1   Years of education: 12   Highest education level: Not on file  Occupational History   Occupation: Geophysicist/field seismologist  Tobacco Use   Smoking status: Never   Smokeless tobacco: Never  Vaping Use   Vaping Use: Never used  Substance and Sexual Activity   Alcohol use: No   Drug use: No   Sexual activity: Not Currently    Birth control/protection: Surgical  Other Topics Concern   Not on file  Social History Narrative   Patient drinks about 1-2 cups of caffeine daily.   Patient is right handed.    Social Determinants of Health   Financial Resource Strain: Not on file  Food Insecurity: Not on file  Transportation Needs: Not on file  Physical Activity: Not on  file  Stress: Not on file  Social Connections: Not on file  Intimate Partner Violence: Not on file   Health Maintenance  Topic Date Due   COVID-19 Vaccine (1) Never done   Hepatitis C Screening  Never done   TETANUS/TDAP  Never done   PAP SMEAR-Modifier  Never done   INFLUENZA VACCINE  Never done   HIV Screening  Completed   HPV VACCINES  Aged Out       Review of Systems Pertinent items noted in HPI and remainder of comprehensive ROS otherwise negative.   Objective:  Blood pressure 108/75, pulse 94, height 5' (1.524 m), weight 166 lb 9.6 oz (75.6 kg), last menstrual period 08/22/2021, currently breastfeeding.   GENERAL: Well-developed, well-nourished female in no acute distress.  HEENT: Normocephalic, atraumatic. Sclerae anicteric.  NECK: Supple. Normal thyroid.  LUNGS: Clear to auscultation bilaterally.  HEART: Regular rate and rhythm. BREASTS: Symmetric in size. No palpable masses or lymphadenopathy, skin changes, or nipple drainage. ABDOMEN: Soft, nontender, nondistended. No organomegaly. PELVIC: Normal external female genitalia. Vagina is pink and rugated.  Normal discharge. Normal appearing cervix. Uterus is normal in size. No adnexal mass or tenderness. Chaperone present during the pelvic exam EXTREMITIES: No cyanosis, clubbing, or edema, 2+ distal pulses.     Assessment:    Healthy female exam.  Plan:    Pap smear collected Screening mammogram ordered Patient will be contacted with abnormal result See After Visit Summary for Counseling Recommendations

## 2021-08-26 ENCOUNTER — Other Ambulatory Visit: Payer: Medicare Other

## 2021-08-28 LAB — CYTOLOGY - PAP
Comment: NEGATIVE
Diagnosis: NEGATIVE
Diagnosis: REACTIVE
High risk HPV: NEGATIVE

## 2021-08-29 ENCOUNTER — Telehealth: Payer: Self-pay | Admitting: *Deleted

## 2021-08-29 ENCOUNTER — Encounter: Payer: Self-pay | Admitting: *Deleted

## 2021-08-29 NOTE — Telephone Encounter (Signed)
TC from patient wanting confirmation of normal Pap. Confirmed.

## 2021-09-10 ENCOUNTER — Ambulatory Visit
Admission: RE | Admit: 2021-09-10 | Discharge: 2021-09-10 | Disposition: A | Discharge status: Home / Self Care | Payer: Medicare Other | Source: Ambulatory Visit | Attending: Family Medicine | Admitting: Family Medicine

## 2021-09-10 ENCOUNTER — Other Ambulatory Visit: Payer: Self-pay

## 2021-09-10 ENCOUNTER — Ambulatory Visit
Admission: RE | Admit: 2021-09-10 | Discharge: 2021-09-10 | Disposition: A | Discharge status: Home / Self Care | Payer: Medicaid Other | Source: Ambulatory Visit | Attending: Family Medicine | Admitting: Family Medicine

## 2021-09-10 DIAGNOSIS — M5441 Lumbago with sciatica, right side: Secondary | ICD-10-CM

## 2021-09-10 DIAGNOSIS — G8929 Other chronic pain: Secondary | ICD-10-CM

## 2021-09-10 DIAGNOSIS — G90529 Complex regional pain syndrome I of unspecified lower limb: Secondary | ICD-10-CM

## 2021-09-10 MED ORDER — GADOBENATE DIMEGLUMINE 529 MG/ML IV SOLN
15.0000 mL | Freq: Once | INTRAVENOUS | Status: AC | PRN
  Administered 2021-09-10: 15 mL via INTRAVENOUS

## 2021-10-29 IMAGING — US US MFM OB LIMITED
1 series · 15 of 22 positions shown · non-contrast
Comparison: none

[Series 1: us mfm ob limited · 15 of 22 slices shown]
[im 1/22]
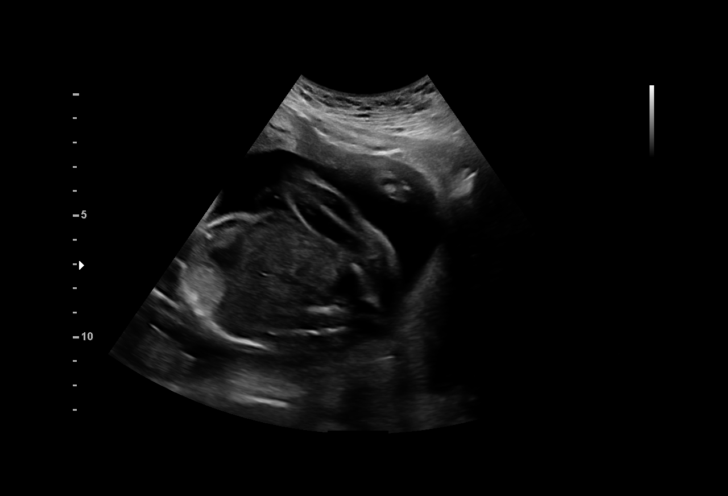
[im 3/22]
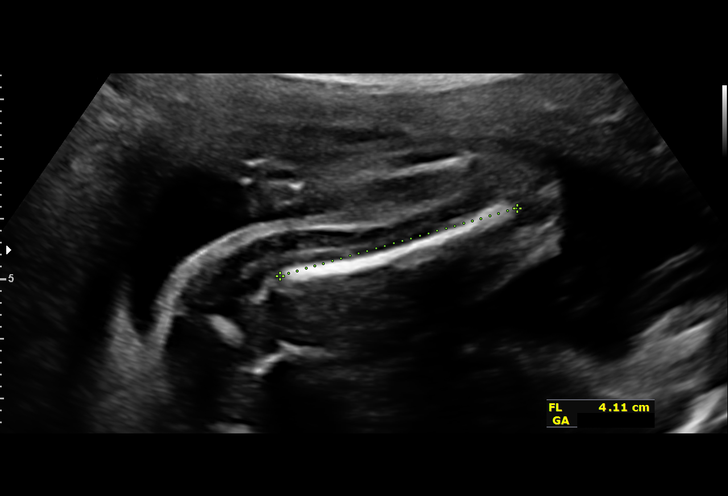
[im 4/22]
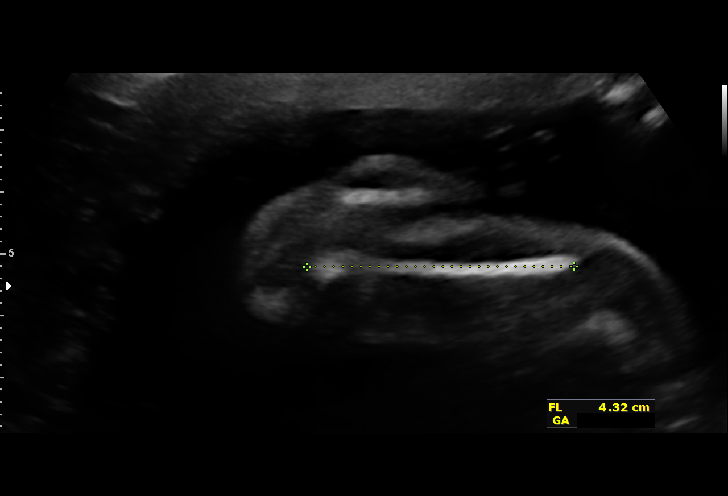
[im 6/22]
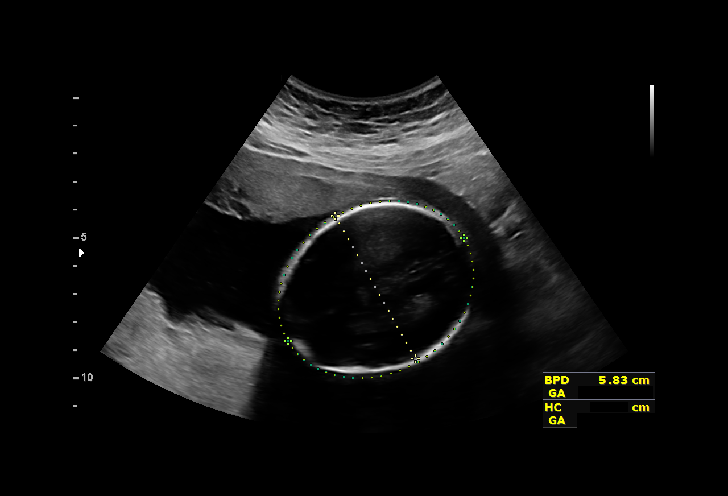
[im 7/22]
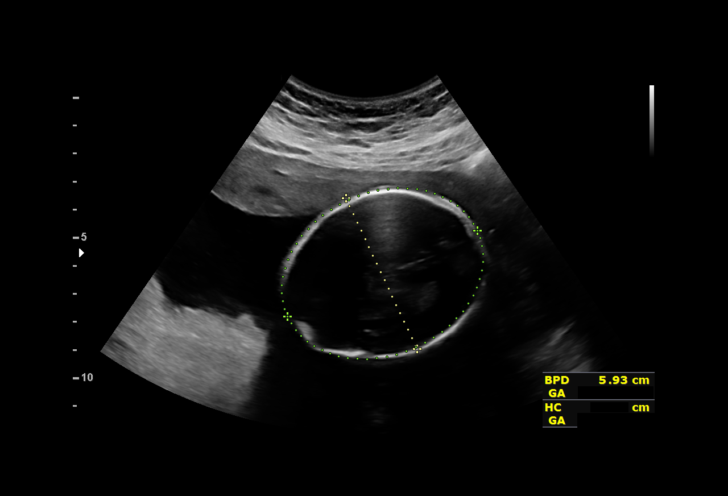
[im 9/22]
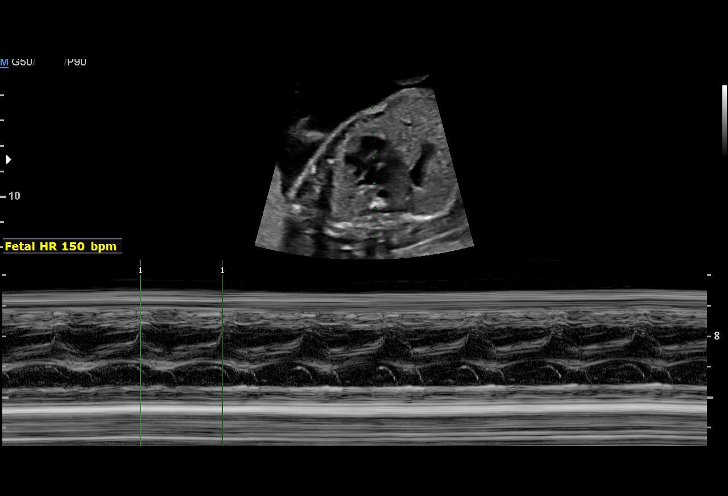
[im 10/22]
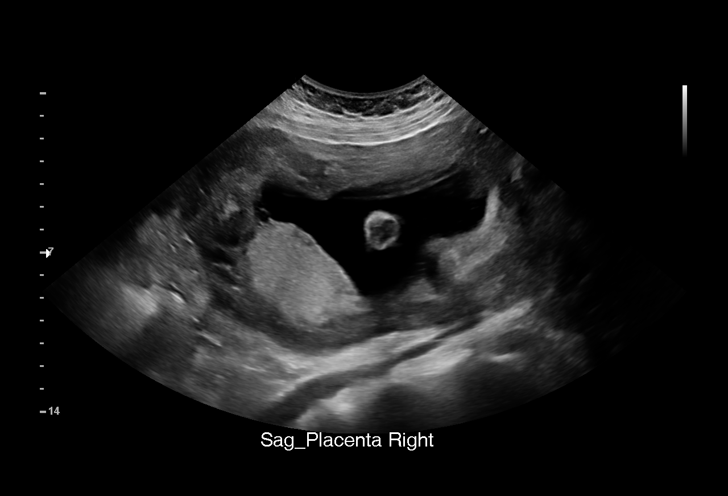
[im 12/22]
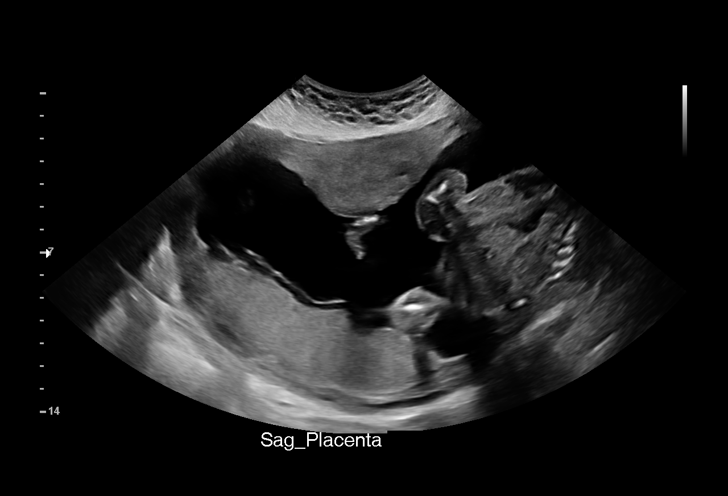
[im 13/22]
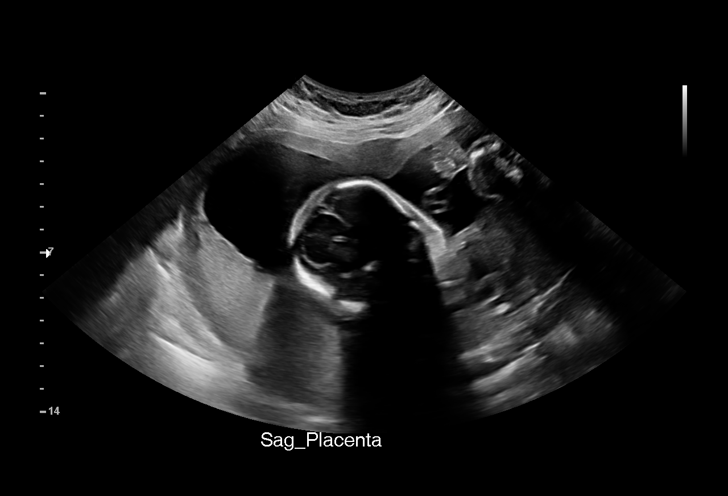
[im 14/22]
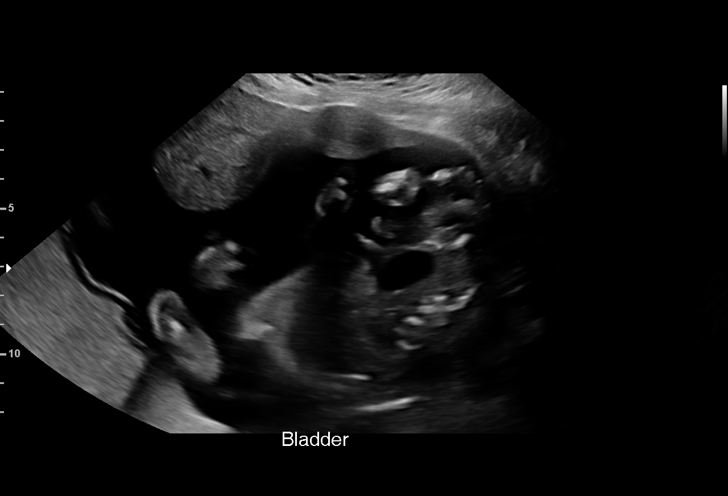
[im 16/22]
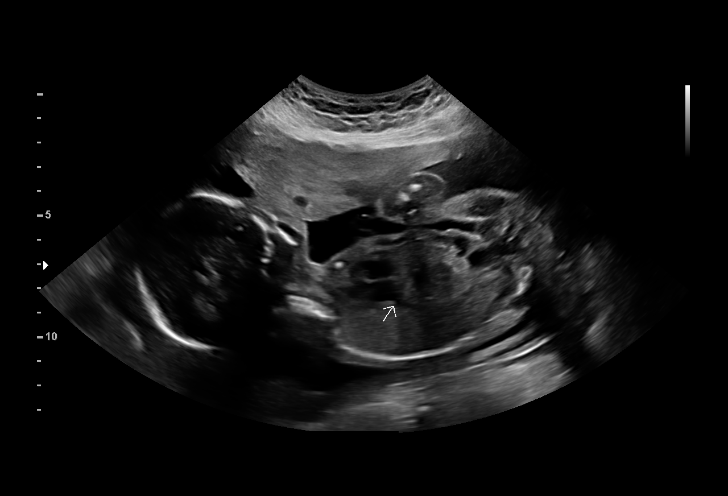
[im 17/22]
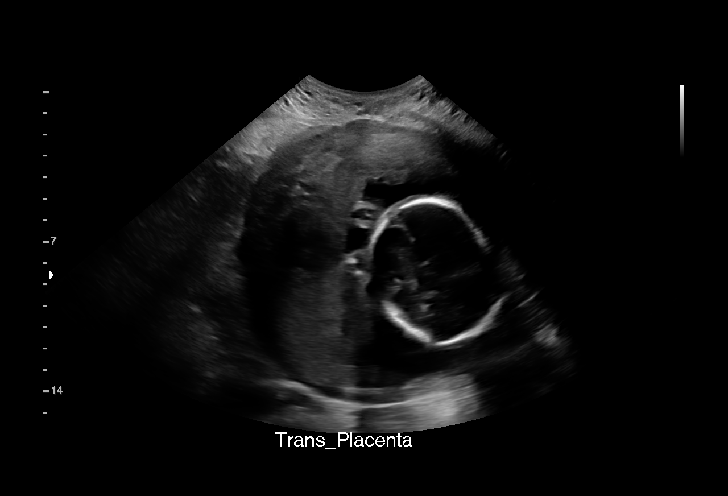
[im 19/22]
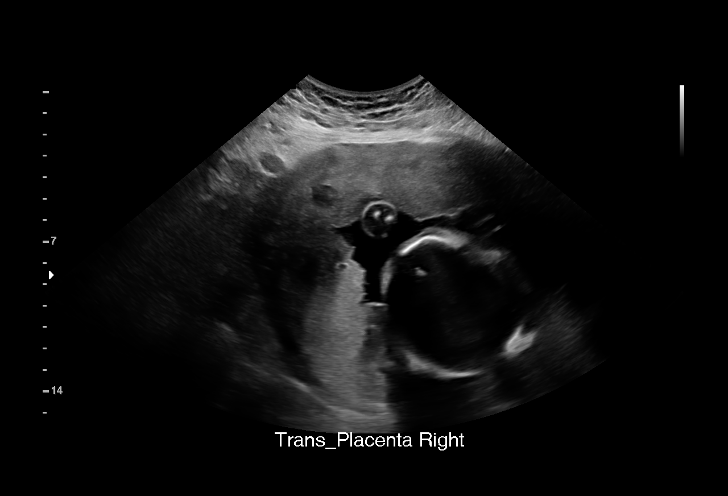
[im 20/22]
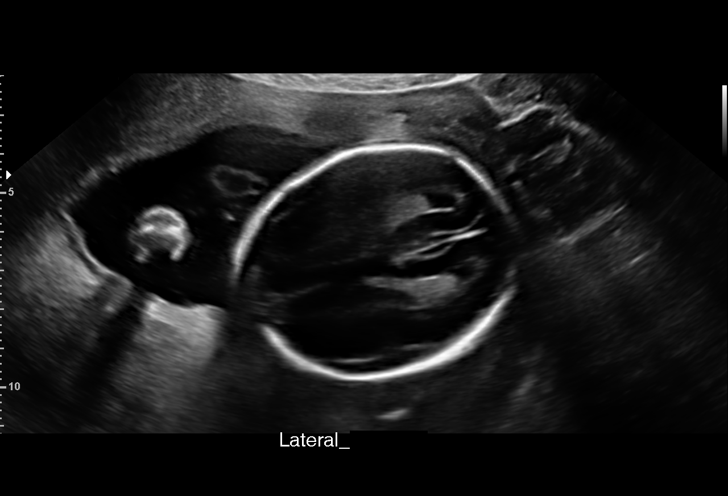
[im 22/22]
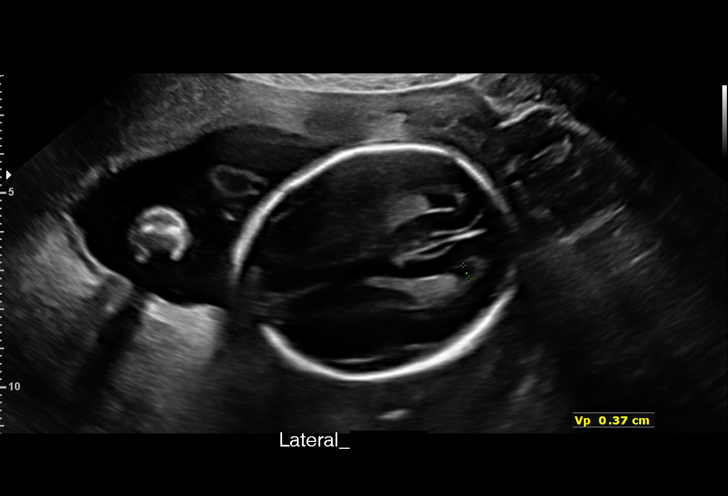

[15 of 22 positions shown; findings below may reference images not displayed]

1  US MFM OB LIMITED                    76815.01     GOKUL MICKELSON
 ----------------------------------------------------------------------

 ----------------------------------------------------------------------
Indications

  23 weeks gestation of pregnancy
  Encounter for uncertain dates
 ----------------------------------------------------------------------
Fetal Evaluation

 Num Of Fetuses:          1
 Fetal Heart Rate(bpm):   150
 Cardiac Activity:        Observed
 Presentation:            Breech
 Placenta:                Posterior Fundal
 P. Cord Insertion:       Not well visualized

 Amniotic Fluid
 AFI FV:      Within normal limits

                             Largest Pocket(cm)

Biometry

 BPD:      58.8  mm     G. Age:  24w 0d         62  %    CI:        79.39   %    70 - 86
                                                         FL/HC:       20.0  %    18.7 -
 HC:      208.6  mm     G. Age:  23w 0d         14  %
                                                         FL/BPD:      70.9  %    71 - 87
 FL:       41.7  mm     G. Age:  23w 4d         37  %

 LV:        3.7  mm
OB History

 Gravidity:    3         Term:   1         SAB:   1
 Living:       1
Gestational Age

 Clinical EDD:  23w 3d                                        EDD:   12/28/19
 U/S Today:     23w 4d                                        EDD:   12/27/19
 Best:          23w 4d     Det. By:  U/S (09/03/19)           EDD:   12/27/19
Impression

 Limited exam
 uncertain dates
 Suboptimal views of the fetal anatomy was obtained.
Recommendations

 Follow up anatomy and datingin in 2 weeks.

## 2021-12-01 IMAGING — US US MFM OB DETAIL+14 WK
1 series · 12 of 28 positions shown · non-contrast
Comparison: none

[Series 1: us mfm ob detail+14 wk · 12 of 103 slices shown]
[im 4/103]
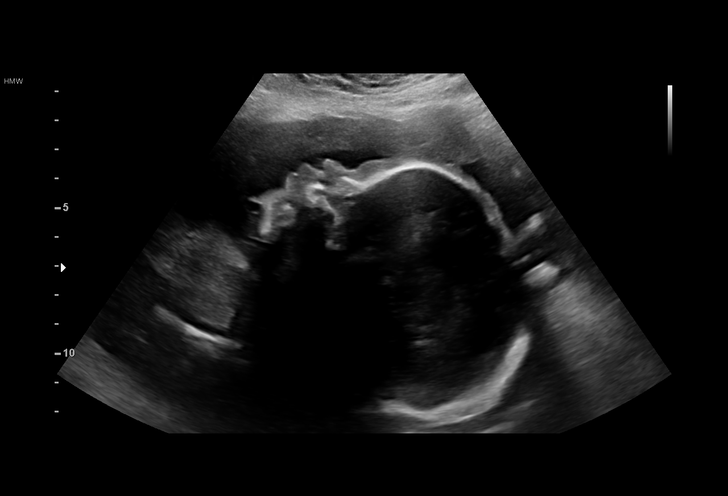
[im 12/103]
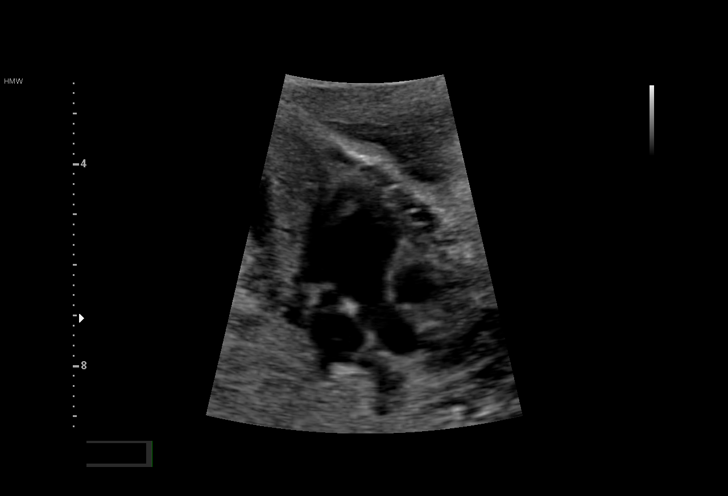
[im 19/103]
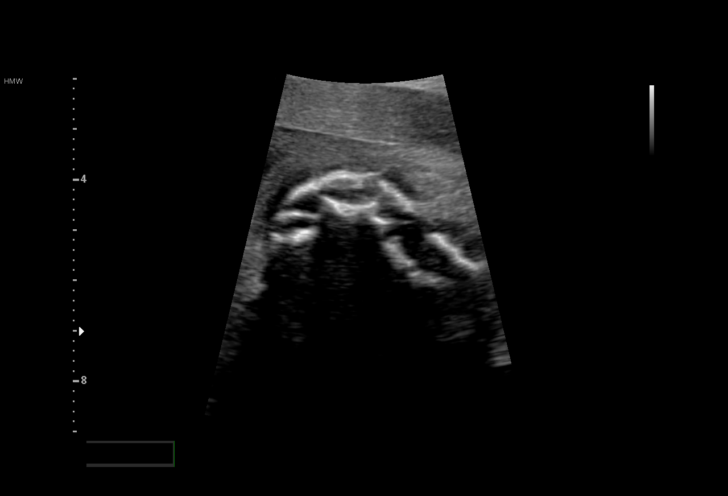
[im 31/103]
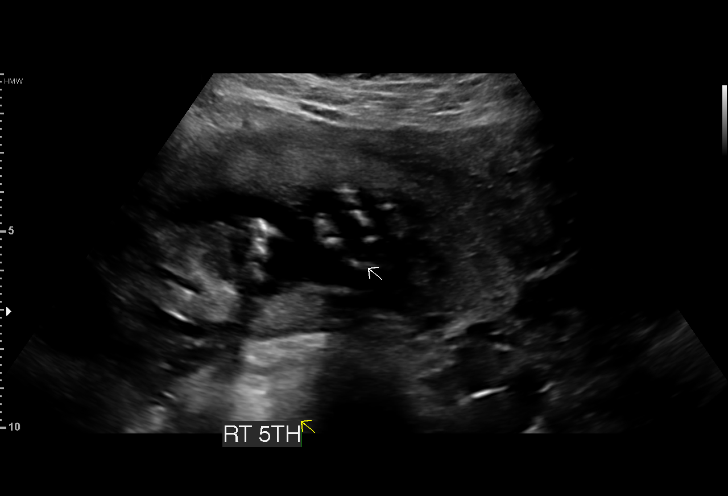
[im 38/103]
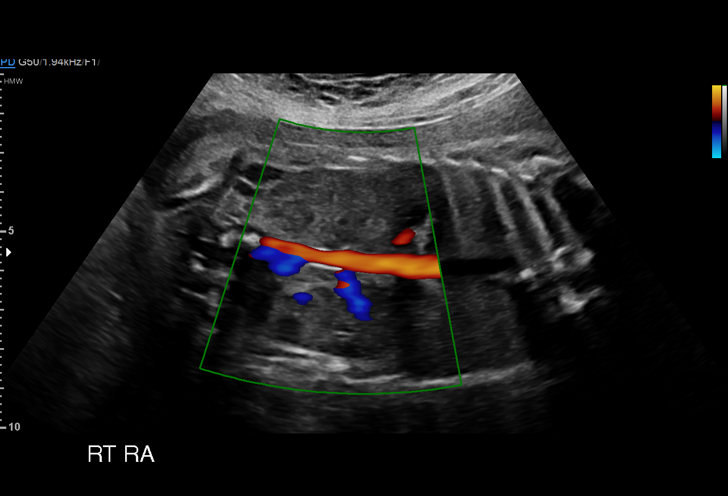
[im 46/103]
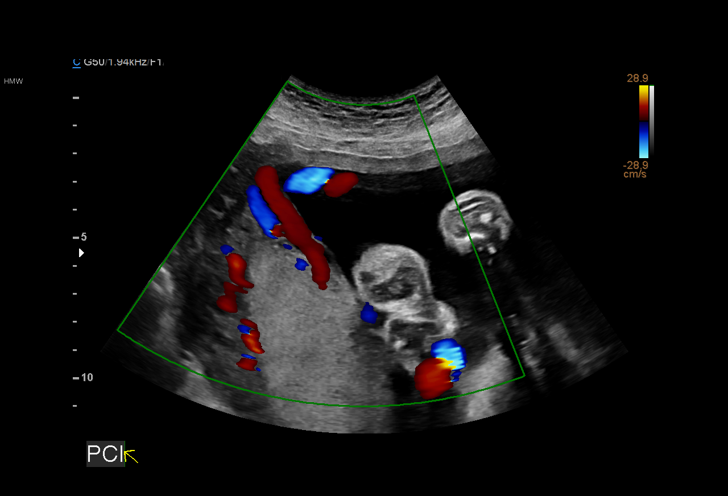
[im 57/103]
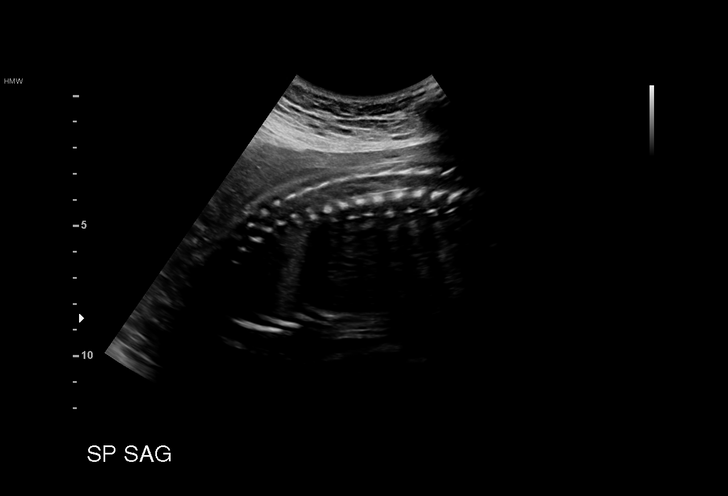
[im 65/103]
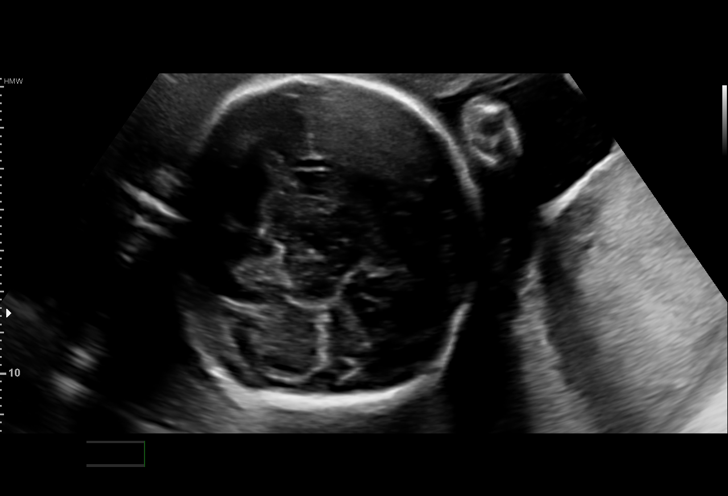
[im 72/103]
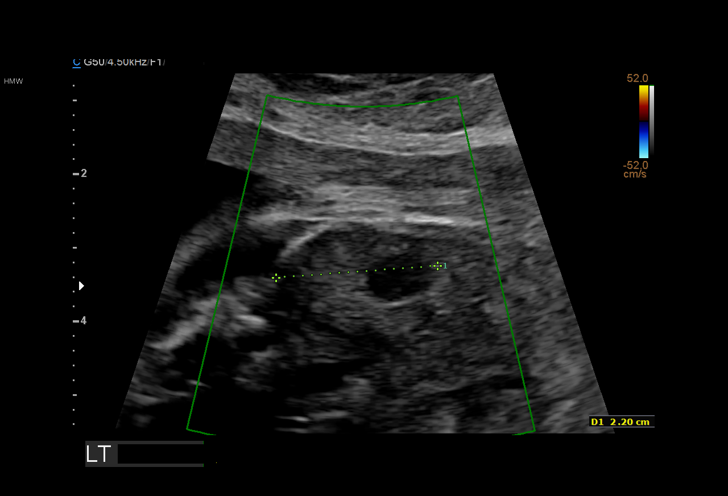
[im 84/103]
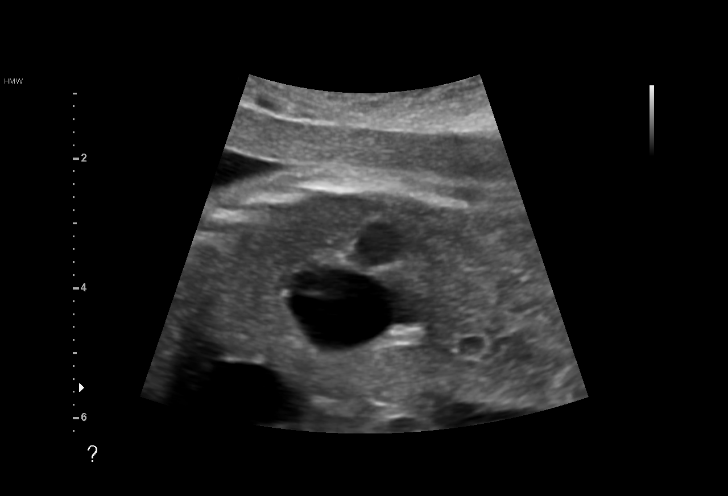
[im 91/103]
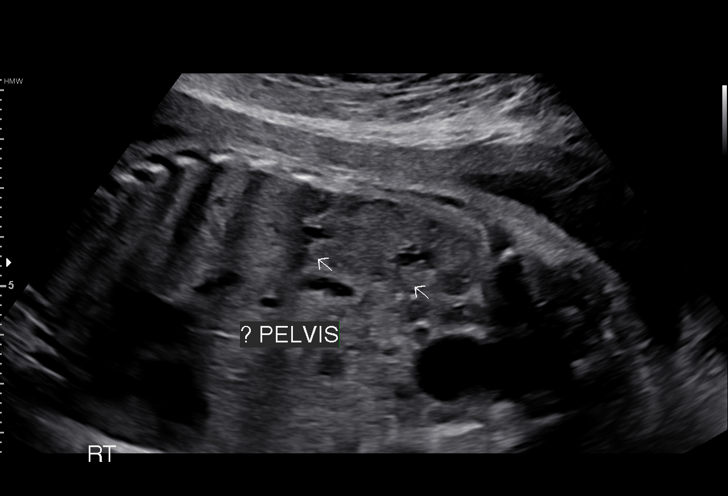
[im 99/103]
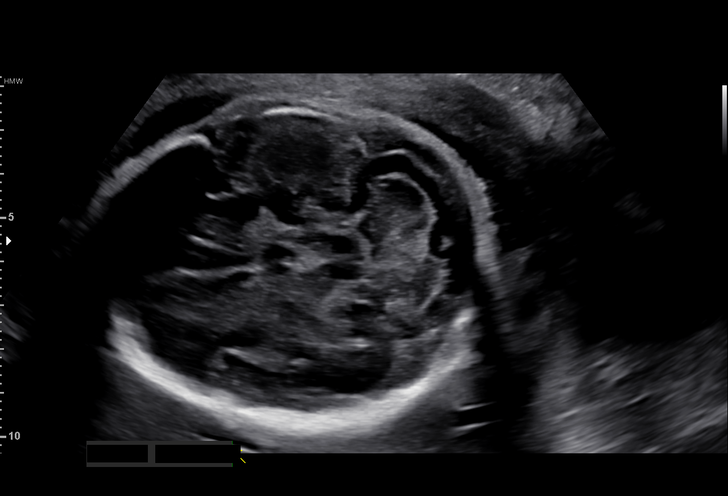

[12 of 28 positions shown; findings below may reference images not displayed]

----------------------------------------------------------------------

 ----------------------------------------------------------------------
Indications

  27 weeks gestation of pregnancy
  Encounter for antenatal screening for
  malformations
  Late prenatal care, second trimester
  Advanced maternal age multigravida 35+,
  second trimester
  Previous cesarean delivery, antepartum
  Fetal abnormality - other known or
  suspected (absent left kidney)
  Marginal insertion of umbilical cord affecting
  management of mother in second trimester
  Uterine fibroids affecting pregnancy in        O34.12,
  second trimester, antepartum
 ----------------------------------------------------------------------
Fetal Evaluation

 Num Of Fetuses:         1
 Fetal Heart Rate(bpm):  151
 Cardiac Activity:       Observed
 Presentation:           Cephalic
 Placenta:               Posterior
 P. Cord Insertion:      Marginal insertion

 Amniotic Fluid
 AFI FV:      Within normal limits

                             Largest Pocket(cm)

Biometry
 BPD:      73.3  mm     G. Age:  29w 3d         95  %    CI:        77.59   %    70 - 86
                                                         FL/HC:      19.6   %    18.6 -
 HC:      263.4  mm     G. Age:  28w 5d         72  %    HC/AC:      1.11        1.05 -
 AC:      238.1  mm     G. Age:  28w 1d         72  %    FL/BPD:     70.3   %    71 - 87
 FL:       51.5  mm     G. Age:  27w 4d         47  %    FL/AC:      21.6   %    20 - 24
 HUM:      46.6  mm     G. Age:  27w 3d         54  %

 Est. FW:    3398  gm      2 lb 9 oz     75  %
OB History

 Gravidity:    3         Term:   1         SAB:   1
 Living:       1
Gestational Age

 Clinical EDD:  28w 1d                                        EDD:   12/28/19
 U/S Today:     28w 3d                                        EDD:   12/26/19
 Best:          27w 1d     Det. By:  Early Ultrasound         EDD:   01/04/20
                                     (05/24/19)
Anatomy

 Cranium:               Appears normal         Aortic Arch:            Not well visualized
 Cavum:                 Appears normal         Ductal Arch:            Not well visualized
 Ventricles:            Appears normal         Diaphragm:              Appears normal
 Choroid Plexus:        Appears normal         Stomach:                Appears normal, left
                                                                       sided
 Cerebellum:            Appears normal         Abdomen:                Appears normal
 Posterior Fossa:       Appears normal         Abdominal Wall:         Appears nml (cord
                                                                       insert, abd wall)
 Nuchal Fold:           Not applicable (>20    Cord Vessels:           Appears normal (3
                        wks GA)                                        vessel cord)
 Face:                  Appears normal         Kidneys:                Crossed renal
                        (orbits and profile)
                                                                       ectopia
 Lips:                  Not well visualized    Bladder:                Appears normal
 Palate:                Appears normal         Spine:                  Appears normal
 Thoracic:              Appears normal         Upper Extremities:      Visualized
 RVOT:                  Appears normal         Lower Extremities:      Visualized
 LVOT:                  Appears normal

 Other:  Nasal bone visualized. Heels visualized. Technically difficult due to
         advanced gestational age.
Cervix Uterus Adnexa

 Cervix
 Not visualized (advanced GA >84wks)
Myomas

  Site                     L(cm)      W(cm)      D(cm)      Location
  Anterior
 ----------------------------------------------------------------------

  Blood Flow                 RI        PI       Comments
 ----------------------------------------------------------------------
Impression

 anatomy scan. She had inadequate prenatal care. Her
 pregnancy is dated by 7-week ultrasound. She has not had
 screening for fetal aneuploidies.
 Obstetric history is significant for a term cesarean delivery in
 1442 of a female infant weighing 3,799 g at birth.
 We performed fetal anatomy scan. Fetal growth is
 appropriate for gestational age. Amniotic fluid is normal and
 good fetal activity is seen. Following findings are seen:
 -Left renal fossa is empty (absent left kidney).
 -Right kidney appears enlarged and slightly globular in
 appearance. On closer evaluation, it appears to have two
 renal pelves. Two renal arteries supplying the right kidney are
 seen.
 -No evidence of pelvic kidney.
 -Marginal cord insertion is seen.
 -A small simple cyst in the spleen(?), measuring 7 x 9
 millimeters, is seen. This could represent a splenic cyst.
 -Placenta is posterior and there is no evidence of previa or
 accreta.
 -An anterior intramural myoma is seen (measurements
 above).
 -These findings are suggestive of crossed renal ectopia
 (fused).
 I explained the diagnosis with help of ultrasound images. I
 informed her that renal ectopia can be associated with fetal
 chromosomal anomalies. I discussed amniocentesis for fetal
 karyotype and microarray analysis. I discussed the procedure
 and possible complications including preterm delivery (1 in
 500 procedures).
 Patient opted not to have amniocentesis.
 I also informed her that marginal cord insertion can be
 associated with fetal growth restriction in some cases. I
 reassured her of normal fetal growth.
 her on the possible splenic cyst (benign and associated with
 good outcomes) because of uncertainty of diagnosis. We will
 reevaluate the cyst at her next visit.

Recommendations

 -An appointment was made for her to return in 4 weeks for
 fetal growth and anatomical evaluation.
 -Patient has an appointment at your office tomorrow.
 -Recommend cell-free fetal DNA screening.
                 Valeh, Aleger
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
## 2022-08-26 ENCOUNTER — Encounter: Payer: Self-pay | Admitting: Internal Medicine

## 2022-08-26 ENCOUNTER — Ambulatory Visit: Payer: 59 | Attending: Internal Medicine | Admitting: Internal Medicine

## 2022-08-26 VITALS — BP 106/73 | HR 88 | Resp 15 | Ht 60.0 in | Wt 185.0 lb

## 2022-08-26 DIAGNOSIS — R768 Other specified abnormal immunological findings in serum: Secondary | ICD-10-CM | POA: Diagnosis not present

## 2022-08-26 DIAGNOSIS — G894 Chronic pain syndrome: Secondary | ICD-10-CM | POA: Diagnosis not present

## 2022-08-26 DIAGNOSIS — M797 Fibromyalgia: Secondary | ICD-10-CM

## 2022-08-28 LAB — SJOGRENS SYNDROME-B EXTRACTABLE NUCLEAR ANTIBODY: SSB (La) (ENA) Antibody, IgG: <1 AI

## 2022-08-28 LAB — C3 AND C4
C3 Complement: 154 mg/dL (ref 83–193)
C4 Complement: 26 mg/dL (ref 15–57)

## 2022-08-28 LAB — ANTI-SCLERODERMA ANTIBODY: Scleroderma (Scl-70) (ENA) Antibody, IgG: <1 AI

## 2022-08-28 LAB — ANTI-DNA ANTIBODY, DOUBLE-STRANDED: ds DNA Ab: <1 IU/mL

## 2022-08-28 LAB — SJOGRENS SYNDROME-A EXTRACTABLE NUCLEAR ANTIBODY: SSA (Ro) (ENA) Antibody, IgG: <1 AI

## 2022-08-28 LAB — ANTI-SMITH ANTIBODY: ENA SM Ab Ser-aCnc: <1 AI

## 2022-08-28 LAB — SEDIMENTATION RATE: Sed Rate: 11 mm/h (ref 0–20)

## 2022-08-28 LAB — RNP ANTIBODY: Ribonucleic Protein(ENA) Antibody, IgG: <1 AI

## 2022-10-16 ENCOUNTER — Telehealth: Payer: Self-pay | Admitting: Internal Medicine

## 2022-10-16 NOTE — Telephone Encounter (Signed)
Patient called the office stating she had an appointment almost 2 months ago and was supposed to get a call regarding labs and ultrasound results. Patient states she still has not received a call and she's still in pain. Patient states she just wants to know what's wrong with her. Patient is requesting a call back.

## 2022-10-18 ENCOUNTER — Other Ambulatory Visit: Payer: Self-pay | Admitting: *Deleted

## 2022-10-18 DIAGNOSIS — M25531 Pain in right wrist: Secondary | ICD-10-CM

## 2022-10-18 NOTE — Telephone Encounter (Signed)
Lab results were all negative. This includes sedimentation rate checking for active inflammation and all of the antibody tests. Ultrasound exam did not reveal any active joint inflammation that would account for her symptoms either. So I think her ANA test was probably a false positive and not really indicating an autoimmune disease. This is good although does not give [place] any specific diagnosis.  The diffuse back and right sided pain still seems most like a nerve type of process. She had previous nerve study in 2018 that did not discover too much. If she wants we can refer to have this checked again. Otherwise may need to see a neve or pain specialist.

## 2022-10-18 NOTE — Telephone Encounter (Signed)
I called patient, patient agreed to nerve study for bil wrist.

## 2022-10-30 ENCOUNTER — Other Ambulatory Visit: Payer: Self-pay

## 2022-10-30 ENCOUNTER — Encounter: Payer: Self-pay | Admitting: Neurology

## 2022-10-30 DIAGNOSIS — R202 Paresthesia of skin: Secondary | ICD-10-CM

## 2022-11-25 ENCOUNTER — Encounter: Payer: Self-pay | Admitting: Neurology

## 2022-11-29 ENCOUNTER — Encounter: Payer: 59 | Admitting: Neurology

## 2022-11-29 ENCOUNTER — Encounter: Payer: Self-pay | Admitting: Neurology

## 2022-12-06 ENCOUNTER — Encounter: Payer: 59 | Admitting: Neurology

## 2023-01-07 ENCOUNTER — Other Ambulatory Visit: Payer: Self-pay

## 2023-01-07 ENCOUNTER — Emergency Department (HOSPITAL_COMMUNITY)
Admission: EM | Admit: 2023-01-07 | Discharge: 2023-01-08 | Disposition: A | Discharge status: Home / Self Care | Payer: 59 | Attending: Emergency Medicine | Admitting: Emergency Medicine

## 2023-01-07 ENCOUNTER — Encounter (HOSPITAL_COMMUNITY): Payer: Self-pay | Admitting: Emergency Medicine

## 2023-01-07 DIAGNOSIS — R202 Paresthesia of skin: Secondary | ICD-10-CM | POA: Insufficient documentation

## 2023-01-07 DIAGNOSIS — M79605 Pain in left leg: Secondary | ICD-10-CM | POA: Insufficient documentation

## 2023-01-07 DIAGNOSIS — Z7982 Long term (current) use of aspirin: Secondary | ICD-10-CM | POA: Diagnosis not present

## 2023-01-07 MED ORDER — HYDROCODONE-ACETAMINOPHEN 5-325 MG PO TABS
1.0000 | ORAL_TABLET | Freq: Once | ORAL | Status: DC
  Filled 2023-01-07: qty 1

## 2023-01-07 MED ORDER — ONDANSETRON 4 MG PO TBDP
8.0000 mg | ORAL_TABLET | Freq: Once | ORAL | Status: DC
  Filled 2023-01-07: qty 2

## 2023-01-07 MED ORDER — ACETAMINOPHEN 325 MG PO TABS
650.0000 mg | ORAL_TABLET | Freq: Once | ORAL | Status: AC | PRN
  Administered 2023-01-07: 650 mg via ORAL
  Filled 2023-01-07: qty 2
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
## 2023-01-07 NOTE — ED Triage Notes (Signed)
Pt c/o left leg pain after her son slept on her left side today at 8 am. Feels pain and leg numbness of the left hip into left upper leg. Sts concerns as pain has not improved throughout the day. Denies any injury/trauma. Hx of fibromyalgia. Sts the pain she feels today is worse than her typical pain.

## 2023-01-07 NOTE — ED Notes (Signed)
Pt continues to complain of ongoing leg pain. Unchanged since triage. Pt refused Vicodin ordered by PA d/t concerns for having to drive home. PRN tylenol given for pain management. Pt wheelchair to lobby.

## 2023-01-08 ENCOUNTER — Ambulatory Visit (HOSPITAL_COMMUNITY): Payer: 59 | Attending: Student

## 2023-01-08 MED ORDER — CYCLOBENZAPRINE HCL 5 MG PO TABS: 5.0000 mg | ORAL_TABLET | Freq: Two times a day (BID) | ORAL | 0 refills | Status: AC | PRN

## 2023-01-08 NOTE — Discharge Instructions (Addendum)
You have been seen here for back pain, I recommend taking over-the-counter pain medications like ibuprofen and/or Tylenol every 6 as needed.  Please follow dosage and on the back of bottle.  I also recommend applying heat to the area and stretching out the muscles as this will help decrease stiffness and pain.  I have given you information on exercises please follow.  I did give you Flexeril take as prescribed can make you drowsy do not consume alcohol or operate heavy she will take this medication.  I have scheduled you for an outpatient DVT study please review the directions above for your appointments.  Follow-up with your primary doctor if symptoms not improving after a week's time  Come back to the emergency department if you develop chest pain, shortness of breath, severe abdominal pain, uncontrolled nausea, vomiting, diarrhea.

## 2023-01-08 NOTE — ED Provider Notes (Signed)
EMERGENCY DEPARTMENT AT [facility] Provider Note   CSN: [id number] Arrival date & time: 01/07/23  2120     History  Chief Complaint  Patient presents with   Leg Pain    L    Alexandria Boyle is a 47 y.o. female.  HPI   Patient with medical history including arthritis, complex regional pain syndrome, chronic back pain, fibromyalgia, presenting with complaints of left leg pain/paresthesias.  Started earlier today, came on after she got up from laying down with her child.  She states that she initially felt pain in her left leg which went down to her left knee this is since resolved but now she has paresthesias just on the lateral aspect of her left mid thigh, she denies any saddle paresthesias urinary or bowel incontinence ease, she denies any traumatic injury to her back, she still able to ambulate without difficulty.  States that she has chronic paresthesias in her legs and arms bilaterally but states this feels little bit different than usual.  She has no unilateral weakness no headaches no change in vision, no recent head trauma, not on anticoag's.  States that she is taking some Tylenol which does help with her pain.  No history of PEs or DVTs currently not on oral birth control no recent surgeries no long immobilizations.  Home Medications Prior to Admission medications   Medication Sig Start Date End Date Taking? Authorizing Provider  cyclobenzaprine (FLEXERIL) 5 MG tablet Take 1 tablet (5 mg total) by mouth 2 (two) times daily as needed for up to 5 days for muscle spasms. 01/08/23 01/13/23 Yes Carroll Sage, PA-C  aspirin EC 81 MG tablet Take 81 mg by mouth daily. Swallow whole.    [provider]  cetirizine HCl (ZYRTEC) 5 MG/5ML SOLN Take 5 mLs (5 mg total) by mouth daily. 12/20/19   Calvert Cantor, CNM  enalapril (VASOTEC) 10 MG tablet Take 1 tablet (10 mg total) by mouth daily. Patient not taking: Reported on 08/26/2022 12/25/19   Sharyon Cable, CNM  enalapril (VASOTEC) 20 MG tablet Take 20 mg by mouth daily.    [provider]  Ferrous Sulfate (IRON PO) Take by mouth.    [provider]  HYDROcodone-acetaminophen (NORCO/VICODIN) 5-325 MG tablet Take 1-2 tablets by mouth every 6 (six) hours as needed. 01/04/20   Tilda Burrow, MD  ibuprofen (ADVIL) 100 MG/5ML suspension Take 20 mLs (400 mg total) by mouth every 4 (four) hours as needed. 01/04/20   Tilda Burrow, MD  lidocaine (LMX) 4 % cream Apply 1 Application topically as needed.    [provider]  Vitamin D, Ergocalciferol, (DRISDOL) 1.25 MG (50000 UNIT) CAPS capsule Take 50,000 Units by mouth once a week. 03/04/22   [provider]      Allergies    Bee pollen, Savella [milnacipran], Tomato, Citrus, Lyrica [pregabalin], Neurontin [gabapentin], and Strawberry extract    Review of Systems   Review of Systems  Constitutional:  Negative for chills and fever.  Respiratory:  Negative for shortness of breath.   Cardiovascular:  Negative for chest pain.  Gastrointestinal:  Negative for abdominal pain.  Neurological:  Positive for numbness. Negative for headaches.    Physical Exam Updated Vital Signs BP (!) 128/94   Pulse 74   Temp 98.5 F (36.9 C) (Oral)   Resp 16   Ht 5' (1.524 m)   Wt 83.9 kg   LMP 01/04/2023   SpO2 100%  BMI 36.13 kg/m  Physical Exam Vitals and nursing note reviewed.  Constitutional:      General: She is not in acute distress.    Appearance: She is not ill-appearing.  HENT:     Head: Normocephalic and atraumatic.     Nose: No congestion.  Eyes:     Conjunctiva/sclera: Conjunctivae normal.  Cardiovascular:     Rate and Rhythm: Normal rate and regular rhythm.     Pulses: Normal pulses.     Heart sounds: No murmur heard.    No friction rub. No gallop.  Pulmonary:     Effort: No respiratory distress.     Breath sounds: No wheezing, rhonchi or rales.  Musculoskeletal:     Comments: Spine was  palpated there is minimal tenderness noted around the paraspinal muscles, pain was focalized reproducible, patient has 5 out of 5 strength, sensation intact to light touch, 2+ dorsal pedal pulses to sec capillary refill in the lower extremities.  Patient would not allow me to do reflexes as she states he would hurt her knees.  Patient has subjective paresthesias in a patch on the left lateral aspect of the mid thigh.   Skin:    General: Skin is warm and dry.  Neurological:     Mental Status: She is alert.  Psychiatric:        Mood and Affect: Mood normal.     ED Results / Procedures / Treatments   Labs (all labs ordered are listed, but only abnormal results are displayed) Labs Reviewed - No data to display  EKG None  Radiology No results found.  Procedures Procedures    Medications Ordered in ED Medications  ondansetron (ZOFRAN-ODT) disintegrating tablet 8 mg (8 mg Oral Patient Refused/Not Given 01/07/23 2142)  HYDROcodone-acetaminophen (NORCO/VICODIN) 5-325 MG per tablet 1 tablet (1 tablet Oral Patient Refused/Not Given 01/07/23 2141)  acetaminophen (TYLENOL) tablet 650 mg (650 mg Oral Given 01/07/23 2247)    ED Course/ Medical Decision Making/ A&P                             Medical Decision Making Risk OTC drugs. Prescription drug management.   This patient presents to the ED for concern of leg pain, this involves an extensive number of treatment options, and is a complaint that carries with it a high risk of complications and morbidity.  The differential diagnosis includes spine equina, limb ischemia, DVT,    Additional history obtained:  Additional history obtained from N/A External records from outside source obtained and reviewed including PCP notes   Co morbidities that complicate the patient evaluation  Regional pain syndrome  Social Determinants of Health:  N/A    Lab Tests:  I Ordered, and personally interpreted labs.  The pertinent results  include: N/A   Imaging Studies ordered:  I ordered imaging studies including N/A I independently visualized and interpreted imaging which showed N/A I agree with the radiologist interpretation   Cardiac Monitoring:  The patient was maintained on a cardiac monitor.  I personally viewed and interpreted the cardiac monitored which showed an underlying rhythm of: N/A   Medicines ordered and prescription drug management:  I ordered medication including N/A I have reviewed the patients home medicines and have made adjustments as needed  Critical Interventions:  N/a   Reevaluation:  Patient had a benign physical exam agreement discharge at this time.  Consultations Obtained:  N/a    Test Considered:  Imaging  of the lower back-this was deferred as my suspicion for fracture is very low at this time no traumatic injury associated with this pain, she is not at an increased risk for pathological fractures.    Rule out I have low suspicion for spinal fracture or spinal cord abnormality as patient denies urinary incontinency, retention, difficulty with bowel movements, denies saddle paresthesias.  Spine was palpated there is no step-off, crepitus or gross deformities felt, patient had  5/5 strength, full range of motion, neurovascular fully intact in the lower extremities..  Low suspicion for compartment syndrome as area was palpated it was soft to the touch, neurovascular fully intact.  Suspicion for limb ischemia is low presentation atypical patient is neurovascular tact in lower extremities.  I doubt DVT no unilateral leg swelling no calf tenderness no palpable cords.    Dispostion and problem list  After consideration of the diagnostic results and the patients response to treatment, I feel that the patent would benefit from discharge.  Leg pain/paresthesias-unclear etiology but my suspicion is likely sciatica and/or chronic pain, will provide muscle relaxer follow-up with her  primary care doctor.  Patient was concern for possible DVT my suspicion is for this is very low but we will have her come back tomorrow for ultrasound as we do not have this at this time.            Final Clinical Impression(s) / ED Diagnoses Final diagnoses:  Left leg pain    Rx / DC Orders ED Discharge Orders          Ordered    cyclobenzaprine (FLEXERIL) 5 MG tablet  2 times daily PRN        01/08/23 0114    LE Venous       Comments: IMPORTANT PATIENT INSTRUCTIONS:  You have been scheduled for an Outpatient Vascular Study at [facility].    If tomorrow is a Saturday, Sunday or holiday, please go to the [facility] Emergency Department Registration Desk at 11 am tomorrow morning and tell them you are there for a vascular study.   If tomorrow is a weekday (Monday-Friday), please go to [facility] En[facility] Registration at 11 am and tell them you are there for a vascular study.   01/08/23 0115              Carroll Sage, PA-C 01/08/23 1610    Tilden Fossa, MD 01/08/23 [phone]

## 2023-01-10 ENCOUNTER — Encounter: Payer: 59 | Admitting: Neurology
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
## 2024-03-18 ENCOUNTER — Ambulatory Visit
Admission: EM | Admit: 2024-03-18 | Discharge: 2024-03-18 | Disposition: A | Discharge status: Home / Self Care | Attending: Physician Assistant | Admitting: Physician Assistant

## 2024-03-18 ENCOUNTER — Telehealth: Payer: Self-pay | Admitting: Physician Assistant

## 2024-03-18 ENCOUNTER — Other Ambulatory Visit: Payer: Self-pay

## 2024-03-18 DIAGNOSIS — R6884 Jaw pain: Secondary | ICD-10-CM | POA: Diagnosis not present

## 2024-03-18 DIAGNOSIS — R252 Cramp and spasm: Secondary | ICD-10-CM | POA: Diagnosis not present

## 2024-03-18 DIAGNOSIS — R220 Localized swelling, mass and lump, head: Secondary | ICD-10-CM

## 2024-03-18 MED ORDER — AMOXICILLIN-POT CLAVULANATE 400-57 MG/5ML PO SUSR: 500.0000 mg | Freq: Three times a day (TID) | ORAL | 0 refills | Status: AC

## 2024-03-18 MED ORDER — FLUCONAZOLE 150 MG PO TABS: 150.0000 mg | ORAL_TABLET | ORAL | 0 refills | Status: DC | PRN

## 2024-03-18 MED ORDER — AMOXICILLIN-POT CLAVULANATE 400-57 MG/5ML PO SUSR: 500.0000 mg | Freq: Three times a day (TID) | ORAL | 0 refills | Status: DC

## 2024-03-18 MED ORDER — ACETAMINOPHEN 325 MG PO TABS
975.0000 mg | ORAL_TABLET | Freq: Once | ORAL | Status: AC
  Administered 2024-03-18: 975 mg via ORAL

## 2024-03-18 NOTE — Discharge Instructions (Signed)
 You were seen today for concerns of severe oral pain and swelling of the face. We have provided you with Tylenol  975 mg here in clinic today to assist with your pain. I have discussed that I recommendation is that you go to the emergency room for more acute management which would include appropriate pain control as well as potential IV antibiotics but you expressed hesitancy to do this and stated that you would prefer to try oral antibiotics first.  I am sending in a liquid version of Augmentin  for you to take by mouth every 8 hours for the next 7 days. You can alternate Tylenol  and ibuprofen  as needed for pain management and fever control.  If you develop any of the following symptoms please go to the ED immediately: Fever and chills not responding to Tylenol  and ibuprofen , inability to open your mouth, difficulty swallowing, drooling, severe facial swelling, more severe pain, confusion, loss of consciousness.

## 2024-03-18 NOTE — ED Provider Notes (Signed)
 [facility]    CSN: [id number] Arrival date & time: 03/18/24  1824      History   Chief Complaint Chief Complaint  Patient presents with   Dental Pain    HPI Alexandria Boyle is a 48 y.o. female.   HPI  Pt is here for concerns of gum pain that started yesterday. She states she has pain with opening her mouth in her jaw and ears.  She states the side of her tongue feels swollen and numb.  Pt states she is able to swallow but cannot chew or spit due to pain  Interventions: she states she took some leftover amoxicillin  today but this has not provided relief. She took some liquid Ibuprofen  but states there wasn't enough left for a full dose. She reports taking the ibuprofen  earlier this AM Pt is attempting to speak without opening her mouth significantly and is speaking very quietly during initial HPI and ROS with this provider.   Past Medical History:  Diagnosis Date   Allergy    Arthritis    Back disorder    Complex regional pain syndrome    CRPS (complex regional pain syndrome type I)    CRPS (complex regional pain syndrome type I)    Depression    Fibromyalgia    Paresthesia 07/13/2015   Syncope and collapse 07/13/2015    Patient Active Problem List   Diagnosis Date Noted   Positive ANA (antinuclear antibody) 08/26/2022   Preeclampsia in postpartum period 12/25/2019   Gestational hypertension, third trimester 12/15/2019   Obesity affecting pregnancy 10/14/2019   History of cesarean section complicating pregnancy 10/14/2019   Fetal renal anomaly, single gestation 10/07/2019   Supervision of other normal pregnancy, antepartum 09/22/2019   AMA, Age>40 09/22/2019   Pelvic pain affecting pregnancy 09/03/2019   No prenatal care in current pregnancy in second trimester 09/03/2019   Anxiety 11/25/2018   Chronic midline low back pain with bilateral sciatica 08/26/2018   Uterine fibroid 04/24/2017   Numbness 02/19/2017   Chronic pain syndrome 02/19/2017    Chronic back pain 09/19/2015   Anemia, iron deficiency 09/19/2015   Fibromyalgia 09/19/2015   Syncope and collapse 07/13/2015    Past Surgical History:  Procedure Laterality Date   CESAREAN SECTION     CESAREAN SECTION WITH BILATERAL TUBAL LIGATION N/A 12/17/2019   Procedure: CESAREAN SECTION WITH BILATERAL TUBAL LIGATION;  Surgeon: Lorence Ozell CROME, MD;  Location: [facility];  Service: Obstetrics;  Laterality: N/A;    OB History     Gravida  3   Para  2   Term  2   Preterm  0   AB  1   Living  2      SAB  1   IAB  0   Ectopic  0   Multiple  0   Live Births  2            Home Medications    Prior to Admission medications   Medication Sig Start Date End Date Taking? Authorizing Provider  amoxicillin -clavulanate (AUGMENTIN ) 400-57 MG/5ML suspension Take 6.3 mLs (500 mg total) by mouth 3 (three) times daily for 7 days. 03/18/24 03/25/24  Twilla Khouri, Rocky BRAVO, PA-C  aspirin  EC 81 MG tablet Take 81 mg by mouth daily. Swallow whole.    [provider]  cetirizine  HCl (ZYRTEC ) 5 MG/5ML SOLN Take 5 mLs (5 mg total) by mouth daily. 12/20/19   Gene Lukes C, CNM  enalapril  (VASOTEC ) 10 MG tablet  Take 1 tablet (10 mg total) by mouth daily. Patient not taking: Reported on 08/26/2022 12/25/19   Sharl Lucienne BROCKS, CNM  enalapril  (VASOTEC ) 20 MG tablet Take 20 mg by mouth daily.    [provider]  Ferrous Sulfate  (IRON PO) Take by mouth.    [provider]  fluconazole  (DIFLUCAN ) 150 MG tablet Take 1 tablet (150 mg total) by mouth every three (3) days as needed. May repeat in 3 days if symptoms not resolved 03/18/24   Keishana Klinger E, PA-C  HYDROcodone -acetaminophen  (NORCO/VICODIN) 5-325 MG tablet Take 1-2 tablets by mouth every 6 (six) hours as needed. 01/04/20   Edsel Norleen GAILS, MD  ibuprofen  (ADVIL ) 100 MG/5ML suspension Take 20 mLs (400 mg total) by mouth every 4 (four) hours as needed. 01/04/20   Edsel Norleen GAILS, MD  lidocaine  (LMX) 4 % cream Apply 1  Application topically as needed.    [provider]  Vitamin D, Ergocalciferol, (DRISDOL) 1.25 MG (50000 UNIT) CAPS capsule Take 50,000 Units by mouth once a week. 03/04/22   [provider]    Family History Family History  Problem Relation Age of Onset   Diabetes Mother    Cancer Father    Cancer Maternal Grandfather    Diabetes Maternal Grandfather     Social History Social History   Tobacco Use   Smoking status: Never    Passive exposure: Never   Smokeless tobacco: Never  Vaping Use   Vaping status: Never Used  Substance Use Topics   Alcohol use: No   Drug use: No     Allergies   Bee pollen, Savella [milnacipran], Tomato, Citrus, Lyrica [pregabalin], Neurontin [gabapentin], and Strawberry extract   Review of Systems Review of Systems  Constitutional:  Negative for chills, diaphoresis and fever.  HENT:  Positive for dental problem. Negative for trouble swallowing.        Severe right sided gum and mouth pain      Physical Exam Triage Vital Signs ED Triage Vitals  Encounter Vitals Group     BP 03/18/24 1830 (!) 142/90     Girls Systolic BP Percentile --      Girls Diastolic BP Percentile --      Boys Systolic BP Percentile --      Boys Diastolic BP Percentile --      Pulse Rate 03/18/24 1830 82     Resp 03/18/24 1830 18     Temp 03/18/24 1830 98.9 F (37.2 C)     Temp Source 03/18/24 1830 Temporal     SpO2 03/18/24 1830 98 %     Weight 03/18/24 1856 184 lb 15.5 oz (83.9 kg)     Height 03/18/24 1830 5' (1.524 m)     Head Circumference --      Peak Flow --      Pain Score 03/18/24 1855 10     Pain Loc --      Pain Education --      Exclude from Growth Chart --    No data found.  Updated Vital Signs BP (!) 142/90 (BP Location: Right Arm)   Pulse 82   Temp 98.9 F (37.2 C) (Temporal)   Resp 18   Ht 5' (1.524 m)   Wt 184 lb 15.5 oz (83.9 kg)   LMP  (Within Weeks)   SpO2 98%   Breastfeeding No   BMI 36.12 kg/m   Visual  Acuity Right Eye Distance:   Left Eye Distance:   Bilateral  Distance:    Right Eye Near:   Left Eye Near:    Bilateral Near:     Physical Exam Vitals reviewed.  Constitutional:      General: She is awake. She is in acute distress.     Appearance: She is well-developed and well-groomed. She is not ill-appearing or toxic-appearing.     Comments: Patient is slumped in exam chair with her right arm cradling her right jaw and cheek.  She is tearful and seems to be trying to talk without opening her mouth very much.  She speaking in a very quiet tone which sometimes makes it difficult to understand what she is saying.  HENT:     Head:     Jaw: Trismus, tenderness, swelling and pain on movement present.     Comments: Pt has significant right sided cheek swelling and tenderness along the right jaw. She has difficulty opening her mouth for exam.    Right Ear: Hearing, tympanic membrane and ear canal normal.     Left Ear: Hearing, tympanic membrane and ear canal normal.     Ears:     Comments: Pt has cotton stuffed into the right ear canal - this was easily removed in entirety by patient to allow for otoscopic exam    Mouth/Throat:     Lips: Pink.     Mouth: Mucous membranes are moist.     Dentition: No dental tenderness, dental caries or gum lesions.     Comments: Pt does not appear to have severe dental caries or signs of dental abscess with regards to gingival swelling.  She does not have obvious tenderness to percussion along the right upper teeth.  There did not appear to be any broken or eroded teeth in this area.  No signs of oral mucosal erythema or obvious blockage of salivary duct. Neurological:     Mental Status: She is alert.  Psychiatric:        Behavior: Behavior is cooperative.      UC Treatments / Results  Labs (all labs ordered are listed, but only abnormal results are displayed) Labs Reviewed - No data to display  EKG   Radiology No results  found.  Procedures Procedures (including critical care time)  Medications Ordered in UC Medications  acetaminophen  (TYLENOL ) tablet 975 mg (975 mg Oral Given 03/18/24 1935)    Initial Impression / Assessment and Plan / UC Course  I have reviewed the triage vital signs and the nursing notes.  Pertinent labs & imaging results that were available during my care of the patient were reviewed by me and considered in my medical decision making (see chart for details).    Patient was reexamined approximately 15 minutes after Tylenol  975 mg was administered.  Her voice was notably louder and she seem to be speaking a bit more clearly.  She reported significant improvement in her pain and felt like the swelling had gone down.  Visual inspection of her cheek and right jaw did not show obvious signs of improvement but patient did not seem to be in acute distress to this provider at that time.  Final Clinical Impressions(s) / UC Diagnoses   Final diagnoses:  Trismus  Jaw pain  Right facial swelling    Pt presents today with concerns for severe right sided jaw pain that is radiating into her right ear.  She reports that pain is very severe and is 10/10 in intensity.  Patient initially appeared to be in acute distress but following  administration of acetaminophen  975 mg patient seemed to be a bit more energetic and noted significant relief.  During initial exam reviewed with patient that I was concerned due to the presence of trismus, severe pain and facial swelling that was observed.  I reviewed that these can be signs of a severe dental infection or abscess and recommend evaluation in the emergency room for potential IV fluids, antibiotics and appropriate pain control.  Patient was very hesitant to do this and did request oral Tylenol  here in the clinic.  Patient then reiterated that she is concerned that her car would not be able to handle the trip to the ED and she stated that her children were at home  and she was worried that they would soon need babysitter relief.  I reviewed with patient that we could send her via EMS but she again stated that this would create a problem with getting her car.  I reviewed with patient that oral antibiotics would take more time to get into her system and provide appropriate therapeutic intervention which could lead to worsening infection.  Patient was still hesitant to go to ED even after being presented with current wait times at nearby emergency departments.  I contacted Dr. Redell Fort for consultation regarding this patient as well as appropriate next steps.  We reviewed that with the patient hesitancy to go to the ED sending in oral antibiotics per her request and reviewing potential complications that may arise from not getting evaluated in the ED is an appropriate alternative.  I reviewed with patient that she could develop a worsening infection, more severe pain, sepsis if she does not go to the ED as recommended.  Patient is amenable to picking up oral antibiotics and and I reviewed signs and symptoms of worsening infection.  These were also provided in her after visit summary.  For now recommend starting Augmentin  liquid p.o. 3 times daily x 7 days to assist with potential dental infection.  She has an appointment with dentistry on Monday and recommend that she keep this appointment for more definitive management.  Follow-up as indicated or  progressing symptoms.   Discharge Instructions      You were seen today for concerns of severe oral pain and swelling of the face. We have provided you with Tylenol  975 mg here in clinic today to assist with your pain. I have discussed that I recommendation is that you go to the emergency room for more acute management which would include appropriate pain control as well as potential IV antibiotics but you expressed hesitancy to do this and stated that you would prefer to try oral antibiotics first.  I am sending in a  liquid version of Augmentin  for you to take by mouth every 8 hours for the next 7 days. You can alternate Tylenol  and ibuprofen  as needed for pain management and fever control.  If you develop any of the following symptoms please go to the ED immediately: Fever and chills not responding to Tylenol  and ibuprofen , inability to open your mouth, difficulty swallowing, drooling, severe facial swelling, more severe pain, confusion, loss of consciousness.     ED Prescriptions     Medication Sig Dispense Auth. Provider   amoxicillin -clavulanate (AUGMENTIN ) 400-57 MG/5ML suspension  (Status: Discontinued) Take 6.3 mLs (500 mg total) by mouth 3 (three) times daily for 7 days. 100 mL Acadia Thammavong E, PA-C   fluconazole  (DIFLUCAN ) 150 MG tablet  (Status: Discontinued) Take 1 tablet (150 mg total) by mouth  every three (3) days as needed. May repeat in 3 days if symptoms not resolved 2 tablet Dandra Velardi E, PA-C   fluconazole  (DIFLUCAN ) 150 MG tablet Take 1 tablet (150 mg total) by mouth every three (3) days as needed. May repeat in 3 days if symptoms not resolved 2 tablet Dannilynn Gallina E, PA-C   amoxicillin -clavulanate (AUGMENTIN ) 400-57 MG/5ML suspension Take 6.3 mLs (500 mg total) by mouth 3 (three) times daily for 7 days. 100 mL Fidel Caggiano E, PA-C      PDMP not reviewed this encounter.   Regene Mccarthy E, PA-C 03/18/24 2147

## 2024-03-18 NOTE — Telephone Encounter (Signed)
 Patient was contacted to verify that she wished for her prescriptions to be sent to the [facility] on [address] versus the original pharmacy that she provided during her appointment.  To this provider patient was speaking normally and reported that she felt significantly better and stated that she was going to try to eat some soup as she felt like she could tolerate p.o. intake.  I reviewed again signs and symptoms that would require emergency evaluation and reviewed the importance of picking up her prescriptions and starting antibiotic therapy as soon as possible.  Patient voiced agreement and understanding and states that she will go to the pharmacy as soon as possible to get her prescriptions

## 2024-03-18 NOTE — ED Triage Notes (Addendum)
 Pt presents with a chief complaint of mouth pain x 1 day. States the pain is located on the right upper side of mouth. Pt currently rates overall mouth pain a 10/10. Pt is holding mouth in triage, crying. Reports her gums feel swollen. Took leftover amoxicillin  at home, three tablets in all with no improvement.

## 2024-05-05 ENCOUNTER — Telehealth: Payer: Self-pay | Admitting: Family Medicine

## 2024-05-05 NOTE — Telephone Encounter (Unsigned)
 Copied from CRM #[id number]. Topic: Clinical - Refused Triage >> May 05, 2024  3:18 PM Lauren C wrote: Calling for new pt appt and mentioned long-standing back pain from sciatica. She is established with a physical med/pain med dr that she will reach out to regarding this pain. Declined RN triage. Appt scheduled for next available new pt visit 06/29/24 with desired provider Dr. Tanda.

## 2024-05-26 NOTE — Progress Notes (Signed)
 Alexandria Boyle                                          MRN: [id number]   05/26/2024   The [facility] Quality Team Specialist reviewed this patient medical record for the purposes of chart review for care gap closure. The following were reviewed: chart review for care gap closure-breast cancer screening and colorectal cancer screening.    [facility] Quality Team

## 2024-06-18 ENCOUNTER — Telehealth: Payer: Self-pay | Admitting: Internal Medicine

## 2024-06-18 NOTE — Telephone Encounter (Signed)
 Copied from CRM #[id number]. Topic: General - Other >> Jun 17, 2024 12:42 PM Zebedee SAUNDERS wrote: Reason for CRM: Pt stated her appt was supposed to be scheduled on 06/18/2024 with Greig Chute along with her daughter's appt that is for 06/18/2024. She would like it scheduled back to back appt's. Pt states she needs her refills. But pt is scheduled for 06/29/2024 3:00 p.m. Again she wants appts back to back for tomorrow. She would like a call back at [phone]. Pt also stated they were originally scheduled back to back but I don't see that. >> Jun 17, 2024 12:57 PM Tobias L wrote: Patient states if she cannot be seen with her daughter tomorrow then appointment would need to be cancelled. Patient states she does not want her daughter penalized at all as this appointment was scheduled incorrectly.

## 2024-06-18 NOTE — Telephone Encounter (Signed)
 Can pt daughter be DB and be seen with pt? No appts available to establish until January. Please advise.

## 2024-06-18 NOTE — Telephone Encounter (Signed)
 Just spoke with pt; I have offered pt transportation and she stated that will not work for her. I offered transportation for her daughter, pt denied and stated she needs to come in with her daughter. I gave the pt the option of rescheduling both appts for next available NP appts with Dr. Tanda in January. Pt stated she has health issues and needs to be seen as soon as possible. Pt mentioned that she is waiting for a call from the manager so that she can accommodate and have both appts scheduled back to back on 11/25. I let pt know that it is up to the provider if appts can be accommodated on the same day, not the manager. Still, pt wants a CB from the manager on Monday as soon as possible. Pt stated that if she does not pick up the phone the first try, to keep calling. I let pt know she will leave her a vm so she can call back, if so. Pt stated she does not have vm. Pt also wants a call on 11/25 if any appt becomes available then so we can schedule her daughter the same day. I let pt know that I will pass the message along to manager and that I will call her if any appts become available for her daughter on 11/25.

## 2024-06-18 NOTE — Telephone Encounter (Unsigned)
 Copied from CRM #[id number]. Topic: Appointments - Scheduling Inquiry for Clinic >> Jun 18, 2024 12:51 PM Tysheama G wrote: Reason for CRM: Patient is calling again because she was supposed to receive a call from the manager. She also wanted to know if you do telehealth appointments. She wants the same appointment as her daughter. If someone can call her ASAP!!!!!

## 2024-06-29 ENCOUNTER — Ambulatory Visit (INDEPENDENT_AMBULATORY_CARE_PROVIDER_SITE_OTHER): Admitting: Family Medicine

## 2024-06-29 ENCOUNTER — Encounter: Payer: Self-pay | Admitting: Family Medicine

## 2024-06-29 VITALS — BP 127/87 | HR 74 | Ht 60.0 in | Wt 193.6 lb

## 2024-06-29 DIAGNOSIS — Z6837 Body mass index (BMI) 37.0-37.9, adult: Secondary | ICD-10-CM

## 2024-06-29 DIAGNOSIS — Z7689 Persons encountering health services in other specified circumstances: Secondary | ICD-10-CM

## 2024-06-29 DIAGNOSIS — M2550 Pain in unspecified joint: Secondary | ICD-10-CM

## 2024-06-29 DIAGNOSIS — J3089 Other allergic rhinitis: Secondary | ICD-10-CM

## 2024-06-29 DIAGNOSIS — I10 Essential (primary) hypertension: Secondary | ICD-10-CM | POA: Diagnosis not present

## 2024-06-29 DIAGNOSIS — E66812 Obesity, class 2: Secondary | ICD-10-CM | POA: Diagnosis not present

## 2024-06-29 MED ORDER — DULOXETINE HCL 20 MG PO CPEP: 20.0000 mg | ORAL_CAPSULE | Freq: Every day | ORAL | 3 refills | Status: AC

## 2024-06-29 MED ORDER — PREDNISONE 10 MG (21) PO TBPK: ORAL_TABLET | ORAL | 0 refills | Status: DC

## 2024-06-29 MED ORDER — CETIRIZINE HCL 5 MG/5ML PO SOLN: 5.0000 mg | Freq: Every day | ORAL | 0 refills | Status: DC

## 2024-06-29 MED ORDER — ENALAPRIL MALEATE 20 MG PO TABS: 20.0000 mg | ORAL_TABLET | Freq: Every day | ORAL | 1 refills | Status: AC

## 2024-06-29 NOTE — Progress Notes (Unsigned)
 New Patient Office Visit  Subjective    Patient ID: Alexandria Boyle, female    DOB: 12-28-1975  Age: 48 y.o. MRN: [id number]  CC:  Chief Complaint  Patient presents with   Establish Care    Pt reports daily pain in her back, knees, wrists.  Pt reports needs anxiety, BP, and allergy medication     HPI Alexandria Boyle presents to establish care. She reports that she has multiple joint pins. She has had a positive ANA in the past and has seen a rheumatologist. She also reports that she needs meds for BP, anxiety, and allergy medication.    Outpatient Encounter Medications as of 06/29/2024  Medication Sig   aspirin  EC 81 MG tablet Take 81 mg by mouth daily. Swallow whole.   cetirizine  HCl (ZYRTEC ) 5 MG/5ML SOLN Take 5 mLs (5 mg total) by mouth daily.   enalapril  (VASOTEC ) 20 MG tablet Take 20 mg by mouth daily.   Ferrous Sulfate  (IRON PO) Take by mouth.   ibuprofen  (ADVIL ) 100 MG/5ML suspension Take 20 mLs (400 mg total) by mouth every 4 (four) hours as needed.   lidocaine  (LMX) 4 % cream Apply 1 Application topically as needed.   enalapril  (VASOTEC ) 10 MG tablet Take 1 tablet (10 mg total) by mouth daily. (Patient not taking: Reported on 08/26/2022)   fluconazole  (DIFLUCAN ) 150 MG tablet Take 1 tablet (150 mg total) by mouth every three (3) days as needed. May repeat in 3 days if symptoms not resolved (Patient not taking: Reported on 06/29/2024)   HYDROcodone -acetaminophen  (NORCO/VICODIN) 5-325 MG tablet Take 1-2 tablets by mouth every 6 (six) hours as needed. (Patient not taking: Reported on 06/29/2024)   Vitamin D, Ergocalciferol, (DRISDOL) 1.25 MG (50000 UNIT) CAPS capsule Take 50,000 Units by mouth once a week. (Patient not taking: Reported on 06/29/2024)   No facility-administered encounter medications on file as of 06/29/2024.    Past Medical History:  Diagnosis Date   Allergy    Arthritis    Back disorder    Complex regional pain syndrome    CRPS (complex regional pain  syndrome type I)    CRPS (complex regional pain syndrome type I)    Depression    Fibromyalgia    Paresthesia 07/13/2015   Syncope and collapse 07/13/2015    Past Surgical History:  Procedure Laterality Date   CESAREAN SECTION     CESAREAN SECTION WITH BILATERAL TUBAL LIGATION N/A 12/17/2019   Procedure: CESAREAN SECTION WITH BILATERAL TUBAL LIGATION;  Surgeon: Lorence Ozell LITTIE, MD;  Location: [facility];  Service: Obstetrics;  Laterality: N/A;    Family History  Problem Relation Age of Onset   Diabetes Mother    Cancer Father    Cancer Maternal Grandfather    Diabetes Maternal Grandfather     Social History   Socioeconomic History   Marital status: Single    Spouse name: Not on file   Number of children: 1   Years of education: 12   Highest education level: Not on file  Occupational History   Occupation: environmental manager  Tobacco Use   Smoking status: Never    Passive exposure: Never   Smokeless tobacco: Never  Vaping Use   Vaping status: Never Used  Substance and Sexual Activity   Alcohol use: No   Drug use: No   Sexual activity: Not Currently    Birth control/protection: Surgical  Other Topics Concern   Not on file  Social History Narrative   Patient  drinks about 1-2 cups of caffeine daily.   Patient is right handed.    Social Drivers of Health   Financial Resource Strain: Medium Risk (06/29/2024)   Overall Financial Resource Strain (CARDIA)    Difficulty of Paying Living Expenses: Somewhat hard  Food Insecurity: Not on file  Transportation Needs: Not on file  Physical Activity: Inactive (06/29/2024)   Exercise Vital Sign    Days of Exercise per Week: 0 days    Minutes of Exercise per Session: 0 min  Stress: Not on file  Social Connections: Socially Isolated (06/29/2024)   Social Connection and Isolation Panel    Frequency of Communication with Friends and Family: More than three times a week    Frequency of Social Gatherings with Friends and Family: Never     Attends Religious Services: Never    Database Administrator or Organizations: No    Attends Banker Meetings: Never    Marital Status: Never married  Catering Manager Violence: Not on file    ROS      Objective   BP 127/87   Pulse 74   Ht 5' (1.524 m)   Wt 193 lb 9.6 oz (87.8 kg)   SpO2 96%   BMI 37.81 kg/m   Physical Exam  {Labs (Optional):23779}    Assessment & Plan:   There are no diagnoses linked to this encounter.   No follow-ups on file.   Tanda Raguel SQUIBB, MD

## 2024-06-30 ENCOUNTER — Encounter: Payer: Self-pay | Admitting: Family Medicine

## 2024-08-26 ENCOUNTER — Ambulatory Visit: Admitting: Obstetrics and Gynecology

## 2024-08-26 ENCOUNTER — Other Ambulatory Visit (HOSPITAL_COMMUNITY)
Admission: RE | Admit: 2024-08-26 | Discharge: 2024-08-26 | Disposition: A | Source: Ambulatory Visit | Attending: Obstetrics and Gynecology | Admitting: Obstetrics and Gynecology

## 2024-08-26 ENCOUNTER — Encounter: Payer: Self-pay | Admitting: Obstetrics and Gynecology

## 2024-08-26 VITALS — BP 132/88 | HR 80 | Ht 60.0 in | Wt 190.0 lb

## 2024-08-26 DIAGNOSIS — Z124 Encounter for screening for malignant neoplasm of cervix: Secondary | ICD-10-CM | POA: Insufficient documentation

## 2024-08-26 DIAGNOSIS — Z1211 Encounter for screening for malignant neoplasm of colon: Secondary | ICD-10-CM

## 2024-08-26 DIAGNOSIS — Z01419 Encounter for gynecological examination (general) (routine) without abnormal findings: Secondary | ICD-10-CM | POA: Diagnosis not present

## 2024-08-26 DIAGNOSIS — Z1231 Encounter for screening mammogram for malignant neoplasm of breast: Secondary | ICD-10-CM
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
## 2024-08-27 ENCOUNTER — Ambulatory Visit: Payer: Self-pay | Admitting: Obstetrics and Gynecology

## 2024-08-27 ENCOUNTER — Other Ambulatory Visit: Payer: Self-pay | Admitting: Family Medicine

## 2024-08-27 LAB — TSH: TSH: 1.09 u[IU]/mL (ref 0.450–4.500)

## 2024-08-27 LAB — COMPREHENSIVE METABOLIC PANEL WITH GFR
ALT: 12 IU/L (ref 0–32)
AST: 15 IU/L (ref 0–40)
Albumin: 4.1 g/dL (ref 3.9–4.9)
Alkaline Phosphatase: 89 IU/L (ref 41–116)
BUN/Creatinine Ratio: 15 (ref 9–23)
BUN: 12 mg/dL (ref 6–24)
Bilirubin Total: 0.5 mg/dL (ref 0.0–1.2)
CO2: 21 mmol/L (ref 20–29)
Calcium: 8.9 mg/dL (ref 8.7–10.2)
Chloride: 102 mmol/L (ref 96–106)
Creatinine, Ser: 0.79 mg/dL (ref 0.57–1.00)
Globulin, Total: 2.9 g/dL (ref 1.5–4.5)
Glucose: 88 mg/dL (ref 70–99)
Potassium: 3.9 mmol/L (ref 3.5–5.2)
Sodium: 137 mmol/L (ref 134–144)
Total Protein: 7 g/dL (ref 6.0–8.5)
eGFR: 93 mL/min/1.73

## 2024-08-27 LAB — HEMOGLOBIN A1C
Est. average glucose Bld gHb Est-mCnc: 131 mg/dL
Hgb A1c MFr Bld: 6.2 % — ABNORMAL HIGH (ref 4.8–5.6)

## 2024-08-27 LAB — CBC
Hematocrit: 39.7 % (ref 34.0–46.6)
Hemoglobin: 12.6 g/dL (ref 11.1–15.9)
MCH: 29.1 pg (ref 26.6–33.0)
MCHC: 31.7 g/dL (ref 31.5–35.7)
MCV: 92 fL (ref 79–97)
Platelets: 245 x10E3/uL (ref 150–450)
RBC: 4.33 x10E6/uL (ref 3.77–5.28)
RDW: 12.7 % (ref 11.7–15.4)
WBC: 7 x10E3/uL (ref 3.4–10.8)

## 2024-08-27 LAB — LIPID PANEL
Chol/HDL Ratio: 3 ratio (ref 0.0–4.4)
Cholesterol, Total: 149 mg/dL (ref 100–199)
HDL: 50 mg/dL
LDL Chol Calc (NIH): 90 mg/dL (ref 0–99)
Triglycerides: 40 mg/dL (ref 0–149)
VLDL Cholesterol Cal: 9 mg/dL (ref 5–40)

## 2024-08-27 NOTE — Telephone Encounter (Signed)
 Pt is requesting more than one refill to accommodate her supply when needed. Pt also wanted provider to know that she does not like the new medication she was put on for nerves but cannot remember the name, states she does not like the way it makes her feel.Please call to pt to advise

## 2024-08-28 LAB — CYTOLOGY - PAP
Comment: NEGATIVE
Diagnosis: NEGATIVE
High risk HPV: NEGATIVE

## 2024-09-29 ENCOUNTER — Ambulatory Visit
